# Patient Record
Sex: Female | Born: 1965 | Race: White | Hispanic: No | Smoking: Current every day smoker
Health system: Southern US, Community
[De-identification: ages and names within clinical notes are randomized; demographics above are authoritative.]

## PROBLEM LIST (undated history)

## (undated) DIAGNOSIS — D352 Benign neoplasm of pituitary gland: Secondary | ICD-10-CM

## (undated) DIAGNOSIS — F32A Depression, unspecified: Secondary | ICD-10-CM

## (undated) DIAGNOSIS — M5416 Radiculopathy, lumbar region: Secondary | ICD-10-CM

## (undated) DIAGNOSIS — IMO0002 Reserved for concepts with insufficient information to code with codable children: Secondary | ICD-10-CM

## (undated) DIAGNOSIS — F419 Anxiety disorder, unspecified: Secondary | ICD-10-CM

## (undated) DIAGNOSIS — R609 Edema, unspecified: Secondary | ICD-10-CM

## (undated) DIAGNOSIS — I10 Essential (primary) hypertension: Secondary | ICD-10-CM

## (undated) DIAGNOSIS — F329 Major depressive disorder, single episode, unspecified: Secondary | ICD-10-CM

## (undated) HISTORY — DX: Major depressive disorder, single episode, unspecified: F32.9

## (undated) HISTORY — DX: Anxiety disorder, unspecified: F41.9

## (undated) HISTORY — DX: Depression, unspecified: F32.A

---

## 2013-05-28 ENCOUNTER — Encounter (HOSPITAL_COMMUNITY): Payer: Self-pay | Admitting: Emergency Medicine

## 2013-05-28 ENCOUNTER — Emergency Department (HOSPITAL_COMMUNITY)
Admission: EM | Admit: 2013-05-28 | Discharge: 2013-05-29 | Disposition: A | Discharge status: Home / Self Care | Payer: Self-pay | Attending: Emergency Medicine | Admitting: Emergency Medicine

## 2013-05-28 ENCOUNTER — Emergency Department (INDEPENDENT_AMBULATORY_CARE_PROVIDER_SITE_OTHER)
Admission: EM | Admit: 2013-05-28 | Discharge: 2013-05-28 | Disposition: A | Discharge status: Nursing Home (Includes ICF) | Payer: Self-pay | Source: Home / Self Care

## 2013-05-28 DIAGNOSIS — R5383 Other fatigue: Secondary | ICD-10-CM

## 2013-05-28 DIAGNOSIS — F3289 Other specified depressive episodes: Secondary | ICD-10-CM

## 2013-05-28 DIAGNOSIS — I10 Essential (primary) hypertension: Secondary | ICD-10-CM | POA: Insufficient documentation

## 2013-05-28 DIAGNOSIS — Z79899 Other long term (current) drug therapy: Secondary | ICD-10-CM | POA: Insufficient documentation

## 2013-05-28 DIAGNOSIS — R45851 Suicidal ideations: Secondary | ICD-10-CM | POA: Insufficient documentation

## 2013-05-28 DIAGNOSIS — Z880 Allergy status to penicillin: Secondary | ICD-10-CM | POA: Insufficient documentation

## 2013-05-28 DIAGNOSIS — F329 Major depressive disorder, single episode, unspecified: Secondary | ICD-10-CM

## 2013-05-28 DIAGNOSIS — F411 Generalized anxiety disorder: Secondary | ICD-10-CM

## 2013-05-28 DIAGNOSIS — F1210 Cannabis abuse, uncomplicated: Secondary | ICD-10-CM | POA: Insufficient documentation

## 2013-05-28 DIAGNOSIS — F17200 Nicotine dependence, unspecified, uncomplicated: Secondary | ICD-10-CM | POA: Insufficient documentation

## 2013-05-28 DIAGNOSIS — F39 Unspecified mood [affective] disorder: Secondary | ICD-10-CM | POA: Diagnosis present

## 2013-05-28 DIAGNOSIS — Z8781 Personal history of (healed) traumatic fracture: Secondary | ICD-10-CM | POA: Insufficient documentation

## 2013-05-28 DIAGNOSIS — F419 Anxiety disorder, unspecified: Secondary | ICD-10-CM

## 2013-05-28 DIAGNOSIS — F410 Panic disorder [episodic paroxysmal anxiety] without agoraphobia: Secondary | ICD-10-CM

## 2013-05-28 DIAGNOSIS — F32A Depression, unspecified: Secondary | ICD-10-CM

## 2013-05-28 DIAGNOSIS — M549 Dorsalgia, unspecified: Secondary | ICD-10-CM | POA: Insufficient documentation

## 2013-05-28 DIAGNOSIS — R5381 Other malaise: Secondary | ICD-10-CM | POA: Insufficient documentation

## 2013-05-28 HISTORY — DX: Radiculopathy, lumbar region: M54.16

## 2013-05-28 HISTORY — DX: Edema, unspecified: R60.9

## 2013-05-28 HISTORY — DX: Essential (primary) hypertension: I10

## 2013-05-28 HISTORY — DX: Benign neoplasm of pituitary gland: D35.2

## 2013-05-28 HISTORY — DX: Reserved for concepts with insufficient information to code with codable children: IMO0002

## 2013-05-28 LAB — RAPID URINE DRUG SCREEN, HOSP PERFORMED
Amphetamines: NOT DETECTED
Barbiturates: NOT DETECTED
Benzodiazepines: NOT DETECTED
COCAINE: NOT DETECTED
OPIATES: NOT DETECTED
Tetrahydrocannabinol: POSITIVE — AB

## 2013-05-28 LAB — CBC
HCT: 45 % (ref 36.0–46.0)
HEMOGLOBIN: 16 g/dL — AB (ref 12.0–15.0)
MCH: 30.3 pg (ref 26.0–34.0)
MCHC: 35.6 g/dL (ref 30.0–36.0)
MCV: 85.2 fL (ref 78.0–100.0)
Platelets: 286 10*3/uL (ref 150–400)
RBC: 5.28 MIL/uL — ABNORMAL HIGH (ref 3.87–5.11)
RDW: 13.1 % (ref 11.5–15.5)
WBC: 10.3 10*3/uL (ref 4.0–10.5)

## 2013-05-28 LAB — COMPREHENSIVE METABOLIC PANEL
ALBUMIN: 4 g/dL (ref 3.5–5.2)
ALT: 13 U/L (ref 0–35)
AST: 15 U/L (ref 0–37)
Alkaline Phosphatase: 82 U/L (ref 39–117)
BUN: 15 mg/dL (ref 6–23)
CALCIUM: 9.9 mg/dL (ref 8.4–10.5)
CO2: 25 mEq/L (ref 19–32)
Chloride: 101 mEq/L (ref 96–112)
Creatinine, Ser: 0.73 mg/dL (ref 0.50–1.10)
GFR calc Af Amer: >90 mL/min (ref >90)
GFR calc non Af Amer: >90 mL/min (ref >90)
GLUCOSE: 100 mg/dL — AB (ref 70–99)
Potassium: 4 mEq/L (ref 3.7–5.3)
Sodium: 140 mEq/L (ref 137–147)
Total Bilirubin: 0.6 mg/dL (ref 0.3–1.2)
Total Protein: 7.3 g/dL (ref 6.0–8.3)

## 2013-05-28 LAB — ETHANOL

## 2013-05-28 LAB — TSH: TSH: 1.65 u[IU]/mL (ref 0.350–4.500)

## 2013-05-28 LAB — ACETAMINOPHEN LEVEL: Acetaminophen (Tylenol), Serum: <15 ug/mL (ref 10–30)

## 2013-05-28 LAB — SALICYLATE LEVEL: Salicylate Lvl: <2 mg/dL — ABNORMAL LOW (ref 2.8–20.0)

## 2013-05-28 LAB — PREGNANCY, URINE: Preg Test, Ur: NEGATIVE

## 2013-05-28 MED ORDER — HYDROCHLOROTHIAZIDE 50 MG PO TABS
50.0000 mg | ORAL_TABLET | Freq: Every day | ORAL | Status: DC
  Administered 2013-05-28 – 2013-05-29 (×2): 50 mg via ORAL
  Filled 2013-05-28 (×3): qty 1

## 2013-05-28 MED ORDER — LORAZEPAM 2 MG/ML IJ SOLN
1.0000 mg | Freq: Once | INTRAMUSCULAR | Status: AC
Start: 2013-05-28 — End: 2013-05-28
  Administered 2013-05-28: 1 mg via INTRAMUSCULAR

## 2013-05-28 MED ORDER — ONDANSETRON HCL 4 MG PO TABS
4.0000 mg | ORAL_TABLET | Freq: Three times a day (TID) | ORAL | Status: DC | PRN
  Administered 2013-05-29: 4 mg via ORAL
  Filled 2013-05-28: qty 1

## 2013-05-28 MED ORDER — ACETAMINOPHEN 325 MG PO TABS: 650.0000 mg | ORAL_TABLET | ORAL | Status: DC | PRN

## 2013-05-28 MED ORDER — PAROXETINE HCL 30 MG PO TABS
30.0000 mg | ORAL_TABLET | Freq: Every day | ORAL | Status: DC
  Administered 2013-05-28 – 2013-05-29 (×2): 30 mg via ORAL
  Filled 2013-05-28 (×3): qty 1

## 2013-05-28 MED ORDER — LORAZEPAM 2 MG/ML IJ SOLN
INTRAMUSCULAR | Status: AC
  Filled 2013-05-28: qty 1

## 2013-05-28 MED ORDER — IBUPROFEN 400 MG PO TABS: 600.0000 mg | ORAL_TABLET | Freq: Three times a day (TID) | ORAL | Status: DC | PRN

## 2013-05-28 MED ORDER — NICOTINE 21 MG/24HR TD PT24
21.0000 mg | MEDICATED_PATCH | Freq: Every day | TRANSDERMAL | Status: DC
  Administered 2013-05-28: 21 mg via TRANSDERMAL
  Filled 2013-05-28: qty 1

## 2013-05-28 MED ORDER — ALUM & MAG HYDROXIDE-SIMETH 200-200-20 MG/5ML PO SUSP: 30.0000 mL | ORAL | Status: DC | PRN

## 2013-05-28 MED ORDER — LORAZEPAM 1 MG PO TABS
1.0000 mg | ORAL_TABLET | Freq: Three times a day (TID) | ORAL | Status: DC | PRN
  Administered 2013-05-28 – 2013-05-29 (×2): 1 mg via ORAL
  Filled 2013-05-28 (×2): qty 1

## 2013-05-28 NOTE — ED Provider Notes (Addendum)
CSN: [id number]     Arrival date & time 05/28/13  66 History   First MD Initiated Contact with Patient 05/28/13 1800     Chief Complaint  Patient presents with  . Medical Clearance  . Anxiety     (Consider location/radiation/quality/duration/timing/severity/associated sxs/prior Treatment) Patient is a 48 y.o. female presenting with anxiety. The history is provided by the patient.  Anxiety This is a chronic problem. Episode onset: years. The problem occurs constantly. The problem has been gradually worsening. Pertinent negatives include no abdominal pain, no headaches and no shortness of breath. Associated symptoms comments: Insomnia, panic attacks, feelings of dread.  Constant worry, suicidal thoughts but no plan.. Exacerbated by: multiple recent stressors in her life. Nothing relieves the symptoms. Treatments tried: paxil and alprazolam. The treatment provided mild relief.    Past Medical History  Diagnosis Date  . Hypertension   . Lumbar radiculopathy   . Vertebral fracture   . Pituitary adenoma   . Edema    History reviewed. No pertinent past surgical history. History reviewed. No pertinent family history. History  Substance Use Topics  . Smoking status: Current Every Day Smoker  . Smokeless tobacco: Not on file  . Alcohol Use: Yes   OB History   Grav Para Term Preterm Abortions TAB SAB Ect Mult Living                 Review of Systems  Constitutional: Positive for fatigue and unexpected weight change.  Respiratory: Negative for cough and shortness of breath.   Gastrointestinal: Negative for nausea, vomiting and abdominal pain.  Musculoskeletal: Positive for back pain.  Neurological: Negative for headaches.  All other systems reviewed and are negative.     Allergies  Penicillins and Sulfur  Home Medications   Prior to Admission medications   Medication Sig Start Date End Date Taking? Authorizing Provider  ALPRAZolam (XANAX) 0.25 MG tablet Take 0.25 mg by  mouth 2 (two) times daily.    Historical Provider, MD  hydrochlorothiazide (HYDRODIURIL) 50 MG tablet Take 50 mg by mouth daily.    Historical Provider, MD  PARoxetine (PAXIL) 30 MG tablet Take 30 mg by mouth daily.    Historical Provider, MD   BP 125/94  Pulse 90  Temp(Src) 98.3 F (36.8 C) (Oral)  Resp 18  Ht 5\' 5"  (1.651 m)  Wt 189 lb 8 oz (85.957 kg)  BMI 31.53 kg/m2  SpO2 99% Physical Exam  Nursing note and vitals reviewed. Constitutional: She is oriented to person, place, and time. She appears well-developed and well-nourished. No distress.  HENT:  Head: Normocephalic and atraumatic.  Eyes: EOM are normal. Pupils are equal, round, and reactive to light.  Cardiovascular: Normal rate, regular rhythm, normal heart sounds and intact distal pulses.  Exam reveals no friction rub.   No murmur heard. Pulmonary/Chest: Effort normal and breath sounds normal. She has no wheezes. She has no rales.  Abdominal: Soft. Bowel sounds are normal. She exhibits no distension. There is no tenderness. There is no rebound and no guarding.  Musculoskeletal: Normal range of motion. She exhibits no tenderness.  No edema  Neurological: She is alert and oriented to person, place, and time. She has normal strength. No cranial nerve deficit or sensory deficit.  Reflex Scores:      Patellar reflexes are 2+ on the right side and 2+ on the left side. Skin: Skin is warm and dry. No rash noted.  Psychiatric: Her speech is normal and behavior is normal. Her mood  appears anxious. She expresses suicidal ideation. She expresses no suicidal plans.  tearful    ED Course  Procedures (including critical care time) Labs Review Labs Reviewed  CBC - Abnormal; Notable for the following:    RBC 5.28 (*)    Hemoglobin 16.0 (*)    All other components within normal limits  COMPREHENSIVE METABOLIC PANEL - Abnormal; Notable for the following:    Glucose, Bld 100 (*)    All other components within normal limits   SALICYLATE LEVEL - Abnormal; Notable for the following:    Salicylate Lvl <2.9 (*)    All other components within normal limits  URINE RAPID DRUG SCREEN (HOSP PERFORMED) - Abnormal; Notable for the following:    Tetrahydrocannabinol POSITIVE (*)    All other components within normal limits  ACETAMINOPHEN LEVEL  ETHANOL  PREGNANCY, URINE  TSH    Imaging Review No results found.   EKG Interpretation None      MDM   Final diagnoses:  Anxiety  Depression    Patient here with worsening depression and anxiety despite taking Paxil mostly due to situational stressors reports suicidal thoughts but no plan and no prior attempts. Patient is calm and cooperative at this time but appears anxious. Labs are within normal limits without acute findings but we'll also add on thyroid studies as patient has history of a pituitary adenoma and no recent evaluation with recent weight gain. Otherwise she is medically clear for psychiatry to evaluate.    Blanchie Dessert, MD 05/28/13 2150  Blanchie Dessert, MD 05/28/13 2150

## 2013-05-28 NOTE — ED Notes (Signed)
Pt sent [facility] from [facility] for further eval. Pt reports having high anxiety, stress levels, depression. Reports thoughts of suicide but no plan or attempts made. Calm and cooperative at triage.

## 2013-05-28 NOTE — ED Notes (Signed)
Patient has multiple complaints.  Patient last seen at [facility], not pleased with this clinic.  Patient has an appt for getting orange card 4/28.  Patient has been out of medicines for 1 day to 2 weeks ago.  Patient lives with mother and is her caregiver, mother suffers from traumatic brain injury.  Multiple psycho-social issues.  Patient has a friend with her today, appears supportive and able to calm patient.  Patient reports bad anxiety, panic attacks and "cant go on this way" statements.  When asked what she was going to do , patient replied "calling crisis hotline" patient states feeling suicidal, and when asked to explain, patient starts talking about stressors in life, but does not report any plan to hurt herself or anyone else.  Patient is tearful, sobbing at times.  Reports she cannot "function" with the way she is feeling.
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
## 2013-05-28 NOTE — ED Provider Notes (Signed)
CSN: [id number]     Arrival date & time 05/28/13  1434 History   First MD Initiated Contact with Patient 05/28/13 1542     Chief Complaint  Patient presents with  . Anxiety  . Depression   (Consider location/radiation/quality/duration/timing/severity/associated sxs/prior Treatment) HPI Comments: 48 y o F with severe stress reaction, anxiety disorder, depression and stresses that she is at the end of her rope. Hx  For 2 yrs. Has no money and has been passed among various health and mental health care providers. Mother very ill and demands constant attention.   Patient is a 48 y.o. female presenting with anxiety.  Anxiety Associated symptoms include shortness of breath.    Past Medical History  Diagnosis Date  . Hypertension   . Lumbar radiculopathy   . Vertebral fracture   . Pituitary adenoma   . Edema    No past surgical history on file. No family history on file. History  Substance Use Topics  . Smoking status: Current Every Day Smoker  . Smokeless tobacco: Not on file  . Alcohol Use: Yes   OB History   Grav Para Term Preterm Abortions TAB SAB Ect Mult Living                 Review of Systems  Constitutional: Positive for activity change and fatigue. Negative for fever.  HENT: Negative.   Respiratory: Positive for shortness of breath.        Associated with anxiety episodes, hyperventilation  Cardiovascular: Negative.   Genitourinary: Positive for menstrual problem.  Musculoskeletal: Positive for arthralgias.  Neurological: Positive for dizziness. Negative for syncope.  Psychiatric/Behavioral: Positive for suicidal ideas, behavioral problems, sleep disturbance, dysphoric mood, decreased concentration and agitation. The patient is nervous/anxious.     Allergies  Penicillins and Sulfur  Home Medications   Prior to Admission medications   Medication Sig Start Date End Date Taking? Authorizing Provider  ALPRAZolam (XANAX) 0.25 MG tablet Take 0.25 mg by mouth 2  (two) times daily.   Yes Historical Provider, MD  hydrochlorothiazide (HYDRODIURIL) 50 MG tablet Take 50 mg by mouth daily.   Yes Historical Provider, MD  PARoxetine (PAXIL) 30 MG tablet Take 30 mg by mouth daily.   Yes Historical Provider, MD   BP 151/101  Pulse 90  Temp(Src) 98.9 F (37.2 C) (Oral)  Resp 16  SpO2 98% Physical Exam  Nursing note and vitals reviewed. Constitutional: She is oriented to person, place, and time. She appears well-developed and well-nourished. She appears distressed.  Eyes: EOM are normal.  Neck: Normal range of motion. Neck supple.  Cardiovascular: Normal rate.   Pulmonary/Chest: Effort normal. No respiratory distress.  Neurological: She is alert and oriented to person, place, and time. She exhibits normal muscle tone.  Skin: Skin is warm and dry.  Psychiatric: Her speech is normal. Her mood appears anxious. She is not withdrawn. Thought content is not delusional. Cognition and memory are normal. She exhibits a depressed mood. She expresses no homicidal plans.  Expresses suicidal ideations that come and go,. She is attentive.    ED Course  Procedures (including critical care time) Labs Review Labs Reviewed - No data to display  No results found for this or any previous visit. Imaging Review No results found.   MDM   1. Depression   2. Anxiety disorder   3. Panic anxiety syndrome     Transfer to [place] for evaluation of psychiatric disorder associated  multiple physical complaints.  Discussed having episodes of  suicidal ideation.     Janne Napoleon, NP 05/28/13 [id number]

## 2013-05-28 NOTE — ED Notes (Signed)
Pt getting into scrubs, have notified charge rn and [facility] about needing a sitter, called security to wand the patient
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
## 2013-05-29 ENCOUNTER — Encounter (HOSPITAL_COMMUNITY): Payer: Self-pay | Admitting: Registered Nurse

## 2013-05-29 DIAGNOSIS — F39 Unspecified mood [affective] disorder: Secondary | ICD-10-CM | POA: Diagnosis present

## 2013-05-29 DIAGNOSIS — R45851 Suicidal ideations: Secondary | ICD-10-CM

## 2013-05-29 DIAGNOSIS — F29 Unspecified psychosis not due to a substance or known physiological condition: Secondary | ICD-10-CM

## 2013-05-29 MED ORDER — ALPRAZOLAM 0.25 MG PO TABS
0.2500 mg | ORAL_TABLET | Freq: Two times a day (BID) | ORAL | Status: DC
  Administered 2013-05-29: 0.25 mg via ORAL
  Filled 2013-05-29: qty 1

## 2013-05-29 MED ORDER — LORAZEPAM 1 MG PO TABS: 0.5000 mg | ORAL_TABLET | Freq: Four times a day (QID) | ORAL | Status: DC | PRN

## 2013-05-29 MED ORDER — PAROXETINE HCL 30 MG PO TABS: 30.0000 mg | ORAL_TABLET | Freq: Every day | ORAL | Status: DC

## 2013-05-29 NOTE — Consult Note (Signed)
  Chart information reviewed and consulted with Dr. Lovena Le After review of patient chart.  Recommend patient for 23 hour observation and set up with outpatient resources.  Patient has been accepted to [facility] observation unit.  Start home medications and transfer patient to [facility] observation.   Zia Kanner B. Zymeir Salminen FNP-BC  11:50 AM Addendum:  Spoke with Dr. Stark Jock and was informed that patient does not want to do observation.  Stated that patient denied suicidal/homicidal ideation, psychosis, and was able to contract for safety.  States that he will be discharging patient home with resources.    Kai Railsback B. Klarissa Mcilvain FNP-BC

## 2013-05-29 NOTE — Discharge Instructions (Signed)
Ativan as prescribed.  Followup with your counselor in 2 weeks as arranged, and return to the emergency department if you develop worsening symptoms or other new or bothersome symptoms.   Depression, Adult Depression refers to feeling sad, low, down in the dumps, blue, gloomy, or empty. In general, there are two kinds of depression: 1. Depression that we all experience from time to time because of upsetting life experiences, including the loss of a job or the ending of a relationship (normal sadness or normal grief). This kind of depression is considered normal, is short lived, and resolves within a few days to 2 weeks. (Depression experienced after the loss of a loved one is called bereavement. Bereavement often lasts longer than 2 weeks but normally gets better with time.) 2. Clinical depression, which lasts longer than normal sadness or normal grief or interferes with your ability to function at home, at work, and in school. It also interferes with your personal relationships. It affects almost every aspect of your life. Clinical depression is an illness. Symptoms of depression also can be caused by conditions other than normal sadness and grief or clinical depression. Examples of these conditions are listed as follows:  Physical illness Some physical illnesses, including underactive thyroid gland (hypothyroidism), severe anemia, specific types of cancer, diabetes, uncontrolled seizures, heart and lung problems, strokes, and chronic pain are commonly associated with symptoms of depression.  Side effects of some prescription medicine In some people, certain types of prescription medicine can cause symptoms of depression.  Substance abuse Abuse of alcohol and illicit drugs can cause symptoms of depression. SYMPTOMS Symptoms of normal sadness and normal grief include the following:  Feeling sad or crying for short periods of time.  Not caring about anything (apathy).  Difficulty sleeping or  sleeping too much.  No longer able to enjoy the things you used to enjoy.  Desire to be by oneself all the time (social isolation).  Lack of energy or motivation.  Difficulty concentrating or remembering.  Change in appetite or weight.  Restlessness or agitation. Symptoms of clinical depression include the same symptoms of normal sadness or normal grief and also the following symptoms:  Feeling sad or crying all the time.  Feelings of guilt or worthlessness.  Feelings of hopelessness or helplessness.  Thoughts of suicide or the desire to harm yourself (suicidal ideation).  Loss of touch with reality (psychotic symptoms). Seeing or hearing things that are not real (hallucinations) or having false beliefs about your life or the people around you (delusions and paranoia). DIAGNOSIS  The diagnosis of clinical depression usually is based on the severity and duration of the symptoms. Your caregiver also will ask you questions about your medical history and substance use to find out if physical illness, use of prescription medicine, or substance abuse is causing your depression. Your caregiver also may order blood tests. TREATMENT  Typically, normal sadness and normal grief do not require treatment. However, sometimes antidepressant medicine is prescribed for bereavement to ease the depressive symptoms until they resolve. The treatment for clinical depression depends on the severity of your symptoms but typically includes antidepressant medicine, counseling with a mental health professional, or a combination of both. Your caregiver will help to determine what treatment is best for you. Depression caused by physical illness usually goes away with appropriate medical treatment of the illness. If prescription medicine is causing depression, talk with your caregiver about stopping the medicine, decreasing the dose, or substituting another medicine. Depression caused by abuse of  alcohol or illicit  drugs abuse goes away with abstinence from these substances. Some adults need professional help in order to stop drinking or using drugs. SEEK IMMEDIATE CARE IF:  You have thoughts about hurting yourself or others.  You lose touch with reality (have psychotic symptoms).  You are taking medicine for depression and have a serious side effect. FOR MORE INFORMATION National Alliance on Mental Illness: www.nami.[employer] of Mental Health: https://carter.com/ Document Released: 01/29/2000 Document Revised: 08/02/2011 Document Reviewed: 05/02/2011 [facility] Patient Information 2014 [place].  Panic Attacks Panic attacks are sudden, short-livedsurges of severe anxiety, fear, or discomfort. They may occur for no reason when you are relaxed, when you are anxious, or when you are sleeping. Panic attacks may occur for a number of reasons:   Healthy people occasionally have panic attacks in extreme, life-threatening situations, such as war or natural disasters. Normal anxiety is a protective mechanism of the body that helps [place] react to danger (fight or flight response).  Panic attacks are often seen with anxiety disorders, such as panic disorder, social anxiety disorder, generalized anxiety disorder, and phobias. Anxiety disorders cause excessive or uncontrollable anxiety. They may interfere with your relationships or other life activities.  Panic attacks are sometimes seen with other mental illnesses such as depression and posttraumatic stress disorder.  Certain medical conditions, prescription medicines, and drugs of abuse can cause panic attacks. SYMPTOMS  Panic attacks start suddenly, peak within 20 minutes, and are accompanied by four or more of the following symptoms:  Pounding heart or fast heart rate (palpitations).  Sweating.  Trembling or shaking.  Shortness of breath or feeling smothered.  Feeling choked.  Chest pain or discomfort.  Nausea or strange feeling in  your stomach.  Dizziness, lightheadedness, or feeling like you will faint.  Chills or hot flushes.  Numbness or tingling in your lips or hands and feet.  Feeling that things are not real or feeling that you are not yourself.  Fear of losing control or going crazy.  Fear of dying. Some of these symptoms can mimic serious medical conditions. For example, you may think you are having a heart attack. Although panic attacks can be very scary, they are not life threatening. DIAGNOSIS  Panic attacks are diagnosed through an assessment by your health care provider. Your health care provider will ask questions about your symptoms, such as where and when they occurred. Your health care provider will also ask about your medical history and use of alcohol and drugs, including prescription medicines. Your health care provider may order blood tests or other studies to rule out a serious medical condition. Your health care provider may refer you to a mental health professional for further evaluation. TREATMENT   Most healthy people who have one or two panic attacks in an extreme, life-threatening situation will not require treatment.  The treatment for panic attacks associated with anxiety disorders or other mental illness typically involves counseling with a mental health professional, medicine, or a combination of both. Your health care provider will help determine what treatment is best for you.  Panic attacks due to physical illness usually goes away with treatment of the illness. If prescription medicine is causing panic attacks, talk with your health care provider about stopping the medicine, decreasing the dose, or substituting another medicine.  Panic attacks due to alcohol or drug abuse goes away with abstinence. Some adults need professional help in order to stop drinking or using drugs. HOME CARE INSTRUCTIONS   Take all  your medicines as prescribed.   Check with your health care provider  before starting new prescription or over-the-counter medicines.  Keep all follow up appointments with your health care provider. SEEK MEDICAL CARE IF:  You are not able to take your medicines as prescribed.  Your symptoms do not improve or get worse. SEEK IMMEDIATE MEDICAL CARE IF:   You experience panic attack symptoms that are different than your usual symptoms.  You have serious thoughts about hurting yourself or others.  You are taking medicine for panic attacks and have a serious side effect. MAKE SURE YOU:  Understand these instructions.  Will watch your condition.  Will get help right away if you are not doing well or get worse. Document Released: 01/31/2005 Document Revised: 11/21/2012 Document Reviewed: 09/14/2012 [facility] Patient Information 2014 [place].     Emergency Department Resource Guide 1) Find a Doctor and Pay Out of Pocket Although you won't have to find out who is covered by your insurance plan, it is a good idea to ask around and get recommendations. You will then need to call the office and see if the doctor you have chosen will accept you as a new patient and what types of options they offer for patients who are self-pay. Some doctors offer discounts or will set up payment plans for their patients who do not have insurance, but you will need to ask so you aren't surprised when you get to your appointment.  2) Contact Your Local Health Department Not all health departments have doctors that can see patients for sick visits, but many do, so it is worth a call to see if yours does. If you don't know where your local health department is, you can check in your phone book. The CDC also has a tool to help you locate your state's health department, and many state websites also have listings of all of their local health departments.  3) Find a [facility] If your illness is not likely to be very severe or complicated, you may want to try a walk in  clinic. These are popping up all over the country in pharmacies, drugstores, and shopping centers. They're usually staffed by nurse practitioners or physician assistants that have been trained to treat common illnesses and complaints. They're usually fairly quick and inexpensive. However, if you have serious medical issues or chronic medical problems, these are probably not your best option.  No Primary Care Doctor: - Call Health Connect at  [phone] - they can help you locate a primary care doctor that  accepts your insurance, provides certain services, etc. - Physician Referral Service- [phone]  Chronic Pain Problems: Organization         Address  Phone   Notes  [facility]  [phone] Patients need to be referred by their primary care doctor.   Medication Assistance: Organization         Address  Phone   Notes  [facility] Medication [facility] [address] [place], [place] [address] [phone] --Must be a resident of [facility] -- Must have NO insurance coverage whatsoever (no Medicaid/ Medicare, etc.) -- The pt. MUST have a primary care doctor that directs their care regularly and follows them in the community   MedAssist  [phone]   [employer]  [phone]    Agencies that provide inexpensive medical care: Organization         Address  Phone   Notes  [facility]  [phone]   [facility]    [phone]   [facility] [place], [place] [address] [phone]   [place] [address] [place] [phone]   Planned Parenthood    [phone]   [facility]    [phone]   [place] and [address], [place] Phone:  [phone], Fax:  [phone] Hours of Operation:  9 am - 6 pm, M-F.  Also accepts Medicaid/Medicare and self-pay.  [facility]  for [address], [address], [place] Phone: [phone], Fax: [phone]. Hours of Operation:  8:30 am - 5:30 pm, M-F.  Also accepts Medicaid and self-pay.  [facility] [place] [address], [place] Phone: [phone]   [place], [place], [place] [phone], Ext. [phone] Mondays & Thursdays: 7-9 AM.  First 15 patients are seen on a first come, first serve basis.    [place] Providers:  Organization         Address  Phone   Notes  [facility] [address], [place] [phone] Also accepts self-pay patients.  [facility] [address], [place]  [phone]   [place], [address], [place] [phone]   [facility] [address] [place] [phone]   Lucianne Lei [address], [place]   [phone] Only accepts [place] Access [place] patients after they have their name applied to their card.   Self-Pay (no insurance) in [facility]:  Organization         Address  Phone   Notes  Sickle Cell Patients, [facility] Internal Medicine [place] [phone]   [facility] [place] [phone]   [facility] [place]  [place], [place], [place] [phone]   [facility]/Dr. Osei-Bonsu  [address] [place] or [address], [place] [phone] Phone number for both [place] and [place] locations is the same.  Urgent Medical and [facility] [address], [place] [phone]   [facility] [address] [place] or [address] [phone]   [facility] [address] [place] [phone], phone; [phone], fax Sees patients  1st and 3rd Saturday of every month.  Must not qualify for public or private insurance (i.e. Medicaid, Medicare, [place] Health Choice, Veterans' Benefits)  Household income should be no more than 200% of the poverty level The clinic cannot treat you if you are pregnant or think you are pregnant  Sexually transmitted diseases are not treated at the clinic.    Dental Care: Organization         Address  Phone  Notes  [facility] Department of [facility] [phone] Accepts children up to age 67 who are enrolled in [place] or [place]; pregnant women with a Medicaid card; and children who have applied for Medicaid or [place] Health Choice, but were declined, whose parents can pay a reduced fee at time of service.  [facility] Department of  [facility]  [address], [place] [phone] Accepts children up to age 73 who are enrolled in Medicaid or [place]; pregnant women with a Medicaid card; and children who have applied for Medicaid or  Health Choice, but were declined, whose parents can pay a reduced fee at time of service.  [place] Adult Dental Access PROGRAM  [place] [phone] Patients are seen by appointment only. Walk-ins are not accepted. [place] will see patients 12 years of age and older. Monday - Tuesday (8am-5pm) Most Wednesdays (8:30-5pm) $30 per visit, cash only  [facility] Adult Dental Access PROGRAM  [address], [facility] [phone] Patients are seen by appointment only. Walk-ins are not accepted. [place] will see patients 70 years of age and older. One Wednesday Evening (Monthly: Volunteer Based).  $30 per visit, cash only  [place]  [phone] for adults; Children under age 65, call Graduate Pediatric Dentistry at [phone]. Children aged 37-14, please call [phone] to request a  pediatric application.  Dental services are provided in all areas of dental care including fillings, crowns and bridges, complete and partial dentures, implants, gum treatment, root canals, and extractions. Preventive care is also provided. Treatment is provided to both adults and children. Patients are selected via a lottery and there is often a waiting list.   [facility] [address], [place]  [phone] [email or web address]   [facility] [address] [place], [place] [phone], Ext. [phone] Second and Fourth Thursday of each month, opens at 6:30 AM; Clinic ends at 9 AM.  Patients are seen on a first-come first-served basis, and a limited number are seen during each clinic.   [facility]  [address] Hillard Danker [place], [place] [phone]   Eligibility Requirements You must have lived in [place], [place], or [place] counties for at least the last three months.   You cannot be eligible for state or federal sponsored [employer], including [employer], [place], or [employer].   You generally cannot be eligible for healthcare insurance through your employer.    How to apply: Eligibility screenings are held every Tuesday and Wednesday afternoon from 1:00 pm until 4:00 pm. You do not need an appointment for the interview!  [facility] [address], [place], [place]   [place]  [place] Department  [place]  [phone]    Behavioral Health Resources in the Community: Intensive Outpatient Programs Organization         Address  Phone  Notes  [place]. [address], [place], [place] [phone]   [facility] Outpatient [address] [place], [place]   ADS: Alcohol & Drug Svcs [address], [place], [place]   [place] [address]  [place], [place] or [phone]   Substance Abuse Resources Organization         Address  Phone  Notes  Alcohol and Drug Services  [phone]   [place]  [phone]   [facility] [place]   Chinita Pester  [phone]   Residential & Outpatient Substance Abuse Program  [phone]   Psychological Services Organization         Address  Phone  Notes  [place]  [place]   [place] [address] [place] or [phone]    Mobile Crisis Teams Organization         Address  Phone  Notes  [facility], Mobile Crisis Care Unit  [phone]   [facility]  [address] [place], [place]   [place] [address], [place] [phone]    Self-Help/Support Groups Organization         Address  Phone             Notes  [place]. of [place] - variety of support groups  [place] Call for more information  Narcotics Anonymous (NA), [facility] [address], [employer] [place]  2 meetings at this location   Special educational needs teacher         Address  Phone  Notes  [facility] Residential Treatment [place],    [place]  [phone]   [facility]  [address] [place] [address], [place], [place]   [place], [place] [phone] Admissions: 8am-3pm M-F  Incentives Substance [place] [address],    [place], [place] [phone]   [facility] [address] [place], [place], [place]   [facility] [address],  [place], [place]   [facility] - Intensive Outpatient [address] Kristeen [address], [place], [place]   [facility] ([place].) [place].,    [place], [place] [phone] or [phone]   [facility] Services (RTS) [address] [place], [place] Accepts Medicaid  [facility] [place] [facility] [address]  [place] [phone] Substance Abuse/Addiction Treatment   [facility] Organization         Address  Phone  Notes  [facility]  [phone]   Domenic Schwab, PhD [address] Arlis Porta [place], [place]   [phone] or [phone]   [place], [place] [phone]   [facility] [address] [place], [place] [phone] Insurance/Medicaid/sponsorship through [facility] and Families [address], [address] [place]                                    [place], [place] [phone] [place] [address], [place] [phone]    Dr. Adele Schilder  [phone]   [facility] [place] [facility] Dept. 1) [address], [place] 2) [place] 3)  [address], [place] [phone]  [phone]   [place] [phone] or [phone] (After Hours)

## 2013-05-29 NOTE — ED Notes (Signed)
Spoke with Pt. On being transferred to [facility]. Pt. Has requested speaking with our EDP.  Pt. And Dr. Stark Jock spoke and pt. Is going to have another TTS.  Pt. Has stated, "I am not suicidal and I would like outpatient treatment."  Spoke with [facility] and they have suggested to redo the TTS and contract to safety.  Dr. Stark Jock is aware and agrees with plan of care.  Updated pt. With plan of care.

## 2013-05-29 NOTE — Consult Note (Signed)
Face to face evaluation and I agree with this note 

## 2013-05-29 NOTE — ED Provider Notes (Addendum)
[facility] and to make arrangements for bed placement.  Veryl Speak, MD 05/29/13 [phone]   Patient requested to see me regarding her desire to be discharged. She states to me that she is not a danger to herself or others and that she has not suicidal or homicidal. She states she has a lot going on in her life and does not feel as though she can be hospitalized. She is requesting something to help with her nerves. She informs me she has a followup appointment with a counselor in 2 weeks. At this point, she appears to have decision-making capacity and does not appear to be a danger to herself. She has not been involuntarily committed and I feel as though she is appropriate for discharge. I will prescribe a small quantity of Ativan and she is to followup with her counselor as scheduled and return to the ER if she develops additional problems.  Veryl Speak, MD 05/29/13 1141

## 2013-05-31 NOTE — ED Provider Notes (Signed)
Medical screening examination/treatment/procedure(s) were performed by resident physician or non-physician practitioner and as supervising physician I was immediately available for consultation/collaboration.   Drema Eddington DOUGLAS MD.   Yaroslav Gombos D Abigaile Rossie, MD 05/31/13 1215 

## 2013-06-11 ENCOUNTER — Ambulatory Visit: Payer: Self-pay

## 2013-06-21 ENCOUNTER — Ambulatory Visit: Payer: Self-pay | Attending: Internal Medicine

## 2013-06-22 ENCOUNTER — Ambulatory Visit: Payer: Self-pay | Attending: Family Medicine | Admitting: Family Medicine

## 2013-06-22 ENCOUNTER — Encounter: Payer: Self-pay | Admitting: Family Medicine

## 2013-06-22 VITALS — BP 121/84 | HR 105 | Temp 98.4°F | Resp 16 | Ht 65.0 in | Wt 193.0 lb

## 2013-06-22 DIAGNOSIS — R5381 Other malaise: Secondary | ICD-10-CM | POA: Insufficient documentation

## 2013-06-22 DIAGNOSIS — F329 Major depressive disorder, single episode, unspecified: Secondary | ICD-10-CM

## 2013-06-22 DIAGNOSIS — F32A Depression, unspecified: Secondary | ICD-10-CM

## 2013-06-22 DIAGNOSIS — Z79899 Other long term (current) drug therapy: Secondary | ICD-10-CM | POA: Insufficient documentation

## 2013-06-22 DIAGNOSIS — E559 Vitamin D deficiency, unspecified: Secondary | ICD-10-CM | POA: Insufficient documentation

## 2013-06-22 DIAGNOSIS — IMO0002 Reserved for concepts with insufficient information to code with codable children: Secondary | ICD-10-CM | POA: Insufficient documentation

## 2013-06-22 DIAGNOSIS — F3289 Other specified depressive episodes: Secondary | ICD-10-CM | POA: Insufficient documentation

## 2013-06-22 DIAGNOSIS — Z124 Encounter for screening for malignant neoplasm of cervix: Secondary | ICD-10-CM | POA: Insufficient documentation

## 2013-06-22 DIAGNOSIS — F1210 Cannabis abuse, uncomplicated: Secondary | ICD-10-CM | POA: Insufficient documentation

## 2013-06-22 DIAGNOSIS — I10 Essential (primary) hypertension: Secondary | ICD-10-CM | POA: Insufficient documentation

## 2013-06-22 DIAGNOSIS — F17200 Nicotine dependence, unspecified, uncomplicated: Secondary | ICD-10-CM | POA: Insufficient documentation

## 2013-06-22 DIAGNOSIS — R5383 Other fatigue: Secondary | ICD-10-CM

## 2013-06-22 DIAGNOSIS — Z975 Presence of (intrauterine) contraceptive device: Secondary | ICD-10-CM | POA: Insufficient documentation

## 2013-06-22 DIAGNOSIS — F411 Generalized anxiety disorder: Secondary | ICD-10-CM | POA: Insufficient documentation

## 2013-06-22 MED ORDER — QUETIAPINE FUMARATE 50 MG PO TABS: 25.0000 mg | ORAL_TABLET | Freq: Two times a day (BID) | ORAL | Status: DC

## 2013-06-22 MED ORDER — HYDROCHLOROTHIAZIDE 12.5 MG PO TABS
25.0000 mg | ORAL_TABLET | Freq: Every day | ORAL | Status: DC
Start: 2013-06-22 — End: 2013-10-15

## 2013-06-22 NOTE — Assessment & Plan Note (Signed)
A: one high BP in system BP Readings from Last 3 Encounters:  06/22/13 121/84  05/29/13 117/80  05/28/13 151/101  P: Decrease HCTZ to recommended max of 25 mg daily Add additional agent like ACE i if needed

## 2013-06-22 NOTE — Patient Instructions (Addendum)
Ms. Coffin,  Thank you for coming in today. It was a pleasure meeting you.  I have refilled HCTZ with a dose decrease to the recommended max of 25 mg daily. I have sent the refill to our pharmacy.   Regarding anxiety and depression: I am concerned for bipolar depression given your screen.  1. Please start seroquel 1/2 tab twice daily, start one 1/2 tab at night for 3 days, then twice daily.  2. Continue ativan as needed only, as we discussed ativan like all benzos are short term only.  3. I also recommend that you see a therapist. I recommend [facility] please call [phone] 4. After one week of seroquel taper down paxil to 15 mg daily.    Your Recent tsh, CBC and CMP were all normal. I have ordered vitamin D today.   Please f/u in 1-2 weeks with our counselor on site.   Please f/u with MD in 3-4 weeks to re-evaluate symptoms on new medication.    Dr. Adrian Blackwater

## 2013-06-22 NOTE — Assessment & Plan Note (Addendum)
A: Regarding anxiety and depression: I am concerned for bipolar depression given your screen.  P: 1. Please start seroquel 1/2 tab twice daily, start one 1/2 tab at night for 3 days, then twice daily.  2. Continue ativan as needed only, as we discussed ativan like all benzos are short term only.  3. I also recommend that you see a therapist. I recommend [facility] please call [phone] 4. After one week of seroquel taper down paxil to 15 mg daily.   Recommend UDS at f/u patient did endorse THC denied other drugs.

## 2013-06-22 NOTE — Assessment & Plan Note (Signed)
A: IUD in place x > 10 years per patient P: Due for removal No ring forceps in office Patient to return for removal once ring forceps in stock
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
## 2013-06-22 NOTE — Progress Notes (Signed)
Pt here to establish care for hypertension diagnosed 6 mnths ago. Pt is taking prescribed HCTZ 50 mg. Tolerating well Pt c/o lower back pain radiating down both legs x 1 yr intemit with leg weakness/buckling. States she had braced fall from weakness 3 weeks ago Hx Depression and Anxiety- Taking her mothers Lorazepam/Paxil  PQ4/anxiety screening given LMP- 1 mnth ago with IUD Last Pap- normal,3 years ago Pt set up for pap smear screening. Refused STD screening

## 2013-06-22 NOTE — Assessment & Plan Note (Signed)
Pap done today  

## 2013-06-24 LAB — VITAMIN D 25 HYDROXY (VIT D DEFICIENCY, FRACTURES): Vit D, 25-Hydroxy: 22 ng/mL — ABNORMAL LOW (ref 30–89)

## 2013-06-26 DIAGNOSIS — E559 Vitamin D deficiency, unspecified: Secondary | ICD-10-CM | POA: Insufficient documentation

## 2013-06-26 MED ORDER — VITAMIN D3 25 MCG (1000 UNIT) PO TABS: 1000.0000 [IU] | ORAL_TABLET | Freq: Every day | ORAL | Status: DC

## 2013-06-26 NOTE — Addendum Note (Signed)
Addended by: Boykin Nearing on: 06/26/2013 09:21 AM   Modules accepted: Orders

## 2013-06-26 NOTE — Assessment & Plan Note (Signed)
A: vitamin D insufficiency P: daily supplementation to prevent deficiency

## 2013-07-03 ENCOUNTER — Telehealth: Payer: Self-pay | Admitting: Emergency Medicine

## 2013-07-03 NOTE — Telephone Encounter (Signed)
Message copied by Ricci Barker on Wed Jul 03, 2013  2:17 PM ------      Message from: Boykin Nearing      Created: Wed Jun 26, 2013  9:30 AM       Please inform patient.      Vit D insufficiency, patient to purchase oral vit D for daily supplementation 1000 IU recommended. She can take gummy, chews, tablets, capsules. ------

## 2013-07-03 NOTE — Telephone Encounter (Signed)
Left message for pt to call when message received 

## 2013-07-05 ENCOUNTER — Telehealth: Payer: Self-pay | Admitting: Emergency Medicine

## 2013-07-05 NOTE — Telephone Encounter (Signed)
Pt family friend called requesting assistance for pt experiencing severe panic attack. Pt was seen by Dr. Adrian Blackwater on 06/22/13 to establish care for anxiety,htn and depressive disorder. Pt was given number to f/u in 2 weeks with Behavioral health for monitor Seroquel and Ativan. Pt given f/u appt with Mateo Flow for further assessment.

## 2013-07-17 ENCOUNTER — Encounter: Payer: Self-pay | Admitting: Internal Medicine

## 2013-07-17 ENCOUNTER — Ambulatory Visit: Payer: Self-pay | Attending: Internal Medicine | Admitting: Internal Medicine

## 2013-07-17 VITALS — BP 123/90 | HR 99 | Temp 98.7°F | Resp 16 | Ht 65.0 in | Wt 190.0 lb

## 2013-07-17 DIAGNOSIS — I10 Essential (primary) hypertension: Secondary | ICD-10-CM | POA: Insufficient documentation

## 2013-07-17 DIAGNOSIS — Z882 Allergy status to sulfonamides status: Secondary | ICD-10-CM | POA: Insufficient documentation

## 2013-07-17 DIAGNOSIS — M549 Dorsalgia, unspecified: Secondary | ICD-10-CM | POA: Insufficient documentation

## 2013-07-17 DIAGNOSIS — Z880 Allergy status to penicillin: Secondary | ICD-10-CM | POA: Insufficient documentation

## 2013-07-17 DIAGNOSIS — Z79899 Other long term (current) drug therapy: Secondary | ICD-10-CM | POA: Insufficient documentation

## 2013-07-17 DIAGNOSIS — Z888 Allergy status to other drugs, medicaments and biological substances status: Secondary | ICD-10-CM | POA: Insufficient documentation

## 2013-07-17 DIAGNOSIS — F17200 Nicotine dependence, unspecified, uncomplicated: Secondary | ICD-10-CM | POA: Insufficient documentation

## 2013-07-17 DIAGNOSIS — G8929 Other chronic pain: Secondary | ICD-10-CM | POA: Insufficient documentation

## 2013-07-17 DIAGNOSIS — F411 Generalized anxiety disorder: Secondary | ICD-10-CM | POA: Insufficient documentation

## 2013-07-17 DIAGNOSIS — F3289 Other specified depressive episodes: Secondary | ICD-10-CM | POA: Insufficient documentation

## 2013-07-17 DIAGNOSIS — F329 Major depressive disorder, single episode, unspecified: Secondary | ICD-10-CM | POA: Insufficient documentation

## 2013-07-17 MED ORDER — ALPRAZOLAM 0.25 MG PO TABS: 0.2500 mg | ORAL_TABLET | Freq: Two times a day (BID) | ORAL | Status: DC | PRN

## 2013-07-17 MED ORDER — MELOXICAM 15 MG PO TABS: 15.0000 mg | ORAL_TABLET | Freq: Every day | ORAL | Status: DC

## 2013-07-17 NOTE — Patient Instructions (Signed)
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
## 2013-07-23 ENCOUNTER — Other Ambulatory Visit: Payer: Self-pay | Admitting: Internal Medicine

## 2013-07-23 DIAGNOSIS — F329 Major depressive disorder, single episode, unspecified: Secondary | ICD-10-CM

## 2013-07-23 DIAGNOSIS — F32A Depression, unspecified: Secondary | ICD-10-CM

## 2013-07-23 DIAGNOSIS — M549 Dorsalgia, unspecified: Secondary | ICD-10-CM

## 2013-07-23 DIAGNOSIS — F411 Generalized anxiety disorder: Secondary | ICD-10-CM

## 2013-07-23 DIAGNOSIS — G8929 Other chronic pain: Secondary | ICD-10-CM

## 2013-07-23 NOTE — Telephone Encounter (Signed)
Patient wants to know if she can have a refill on Seroquel, Xanax, and Meloxicam until she see psych MD in July.

## 2013-07-23 NOTE — Telephone Encounter (Signed)
Pt. Has scheduled appt. with Psychiatric in July.....she has ran out of Xanax. But will need a refill on Seroquel and Meloxicam..Marland KitchenPlease call patient and advise as to what she can do until she sees a psychiatrist..

## 2013-07-24 MED ORDER — MELOXICAM 15 MG PO TABS: 15.0000 mg | ORAL_TABLET | Freq: Every day | ORAL | Status: DC

## 2013-07-24 MED ORDER — ALPRAZOLAM 0.25 MG PO TABS: 0.2500 mg | ORAL_TABLET | Freq: Two times a day (BID) | ORAL | Status: DC | PRN

## 2013-07-24 MED ORDER — QUETIAPINE FUMARATE 50 MG PO TABS: 25.0000 mg | ORAL_TABLET | Freq: Two times a day (BID) | ORAL | Status: DC

## 2013-08-05 ENCOUNTER — Telehealth: Payer: Self-pay | Admitting: Internal Medicine
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
## 2013-08-06 ENCOUNTER — Telehealth: Payer: Self-pay | Admitting: Emergency Medicine

## 2013-08-06 NOTE — Telephone Encounter (Signed)
Pt requesting medication refill of Xanax due to stress until sen by psychiatry 08/23/13

## 2013-08-11 NOTE — Telephone Encounter (Signed)
Please call patient and remind her that no more Xanax will be filled from this office per last visit note. Patient will need to see psychiatry for further management of anxiety and stress management. Please let her know that she may schedule appointment with Tywan if needed.

## 2013-08-12 ENCOUNTER — Telehealth: Payer: Self-pay | Admitting: Emergency Medicine

## 2013-08-12 NOTE — Telephone Encounter (Signed)
Left message for pt to call when message received 

## 2013-10-03 ENCOUNTER — Emergency Department (HOSPITAL_COMMUNITY)
Admission: EM | Admit: 2013-10-03 | Discharge: 2013-10-06 | Disposition: A | Discharge status: Other Acute Inpatient Hospital | Payer: Federal, State, Local not specified - Other | Attending: Emergency Medicine | Admitting: Emergency Medicine

## 2013-10-03 ENCOUNTER — Encounter (HOSPITAL_COMMUNITY): Payer: Self-pay | Admitting: Emergency Medicine

## 2013-10-03 DIAGNOSIS — I10 Essential (primary) hypertension: Secondary | ICD-10-CM | POA: Insufficient documentation

## 2013-10-03 DIAGNOSIS — F1210 Cannabis abuse, uncomplicated: Secondary | ICD-10-CM | POA: Insufficient documentation

## 2013-10-03 DIAGNOSIS — F1510 Other stimulant abuse, uncomplicated: Secondary | ICD-10-CM | POA: Insufficient documentation

## 2013-10-03 DIAGNOSIS — Z862 Personal history of diseases of the blood and blood-forming organs and certain disorders involving the immune mechanism: Secondary | ICD-10-CM | POA: Insufficient documentation

## 2013-10-03 DIAGNOSIS — F329 Major depressive disorder, single episode, unspecified: Secondary | ICD-10-CM | POA: Diagnosis present

## 2013-10-03 DIAGNOSIS — Z880 Allergy status to penicillin: Secondary | ICD-10-CM | POA: Insufficient documentation

## 2013-10-03 DIAGNOSIS — F39 Unspecified mood [affective] disorder: Secondary | ICD-10-CM | POA: Insufficient documentation

## 2013-10-03 DIAGNOSIS — F32A Depression, unspecified: Secondary | ICD-10-CM | POA: Diagnosis present

## 2013-10-03 DIAGNOSIS — Z8639 Personal history of other endocrine, nutritional and metabolic disease: Secondary | ICD-10-CM | POA: Insufficient documentation

## 2013-10-03 DIAGNOSIS — F411 Generalized anxiety disorder: Secondary | ICD-10-CM | POA: Insufficient documentation

## 2013-10-03 DIAGNOSIS — Z8781 Personal history of (healed) traumatic fracture: Secondary | ICD-10-CM | POA: Insufficient documentation

## 2013-10-03 DIAGNOSIS — F3289 Other specified depressive episodes: Secondary | ICD-10-CM | POA: Insufficient documentation

## 2013-10-03 DIAGNOSIS — F17200 Nicotine dependence, unspecified, uncomplicated: Secondary | ICD-10-CM | POA: Insufficient documentation

## 2013-10-03 DIAGNOSIS — R45851 Suicidal ideations: Secondary | ICD-10-CM

## 2013-10-03 DIAGNOSIS — F419 Anxiety disorder, unspecified: Secondary | ICD-10-CM | POA: Diagnosis present

## 2013-10-03 DIAGNOSIS — Z8739 Personal history of other diseases of the musculoskeletal system and connective tissue: Secondary | ICD-10-CM | POA: Insufficient documentation

## 2013-10-03 DIAGNOSIS — IMO0002 Reserved for concepts with insufficient information to code with codable children: Secondary | ICD-10-CM | POA: Insufficient documentation

## 2013-10-03 DIAGNOSIS — Z79899 Other long term (current) drug therapy: Secondary | ICD-10-CM | POA: Insufficient documentation

## 2013-10-03 LAB — CBC
HCT: 43.4 % (ref 36.0–46.0)
Hemoglobin: 15 g/dL (ref 12.0–15.0)
MCH: 29.8 pg (ref 26.0–34.0)
MCHC: 34.6 g/dL (ref 30.0–36.0)
MCV: 86.3 fL (ref 78.0–100.0)
Platelets: 323 10*3/uL (ref 150–400)
RBC: 5.03 MIL/uL (ref 3.87–5.11)
RDW: 12.5 % (ref 11.5–15.5)
WBC: 10 10*3/uL (ref 4.0–10.5)

## 2013-10-03 LAB — COMPREHENSIVE METABOLIC PANEL
ALBUMIN: 3.7 g/dL (ref 3.5–5.2)
ALT: 12 U/L (ref 0–35)
AST: 11 U/L (ref 0–37)
Alkaline Phosphatase: 65 U/L (ref 39–117)
Anion gap: 11 (ref 5–15)
BUN: 10 mg/dL (ref 6–23)
CO2: 25 mEq/L (ref 19–32)
Calcium: 10.5 mg/dL (ref 8.4–10.5)
Chloride: 102 mEq/L (ref 96–112)
Creatinine, Ser: 0.65 mg/dL (ref 0.50–1.10)
Glucose, Bld: 173 mg/dL — ABNORMAL HIGH (ref 70–99)
Potassium: 3.5 mEq/L — ABNORMAL LOW (ref 3.7–5.3)
Sodium: 138 mEq/L (ref 137–147)
Total Bilirubin: 0.5 mg/dL (ref 0.3–1.2)
Total Protein: 7 g/dL (ref 6.0–8.3)

## 2013-10-03 LAB — RAPID URINE DRUG SCREEN, HOSP PERFORMED
Amphetamines: POSITIVE — AB
BARBITURATES: NOT DETECTED
Benzodiazepines: NOT DETECTED
Cocaine: NOT DETECTED
Opiates: NOT DETECTED
Tetrahydrocannabinol: POSITIVE — AB

## 2013-10-03 LAB — SALICYLATE LEVEL: Salicylate Lvl: <2 mg/dL — ABNORMAL LOW (ref 2.8–20.0)

## 2013-10-03 LAB — ETHANOL

## 2013-10-03 LAB — ACETAMINOPHEN LEVEL: Acetaminophen (Tylenol), Serum: <15 ug/mL (ref 10–30)

## 2013-10-03 LAB — LITHIUM LEVEL

## 2013-10-03 MED ORDER — ACETAMINOPHEN 325 MG PO TABS
650.0000 mg | ORAL_TABLET | ORAL | Status: DC | PRN
Start: 2013-10-03 — End: 2013-10-06
  Administered 2013-10-03: 650 mg via ORAL
  Filled 2013-10-03: qty 2

## 2013-10-03 MED ORDER — LORAZEPAM 1 MG PO TABS
1.0000 mg | ORAL_TABLET | Freq: Three times a day (TID) | ORAL | Status: DC | PRN
  Administered 2013-10-03 – 2013-10-06 (×6): 1 mg via ORAL
  Filled 2013-10-03 (×6): qty 1

## 2013-10-03 MED ORDER — NICOTINE 21 MG/24HR TD PT24: 21.0000 mg | MEDICATED_PATCH | Freq: Every day | TRANSDERMAL | Status: DC

## 2013-10-03 MED ORDER — HYDROCHLOROTHIAZIDE 25 MG PO TABS
25.0000 mg | ORAL_TABLET | Freq: Every day | ORAL | Status: DC
  Administered 2013-10-04 – 2013-10-06 (×3): 25 mg via ORAL
  Filled 2013-10-03 (×3): qty 1

## 2013-10-03 MED ORDER — IBUPROFEN 200 MG PO TABS
600.0000 mg | ORAL_TABLET | Freq: Three times a day (TID) | ORAL | Status: DC | PRN
  Administered 2013-10-04 – 2013-10-05 (×2): 600 mg via ORAL
  Filled 2013-10-03 (×2): qty 3

## 2013-10-03 MED ORDER — ONDANSETRON HCL 4 MG PO TABS: 4.0000 mg | ORAL_TABLET | Freq: Three times a day (TID) | ORAL | Status: DC | PRN

## 2013-10-03 MED ORDER — ALUM & MAG HYDROXIDE-SIMETH 200-200-20 MG/5ML PO SUSP: 30.0000 mL | ORAL | Status: DC | PRN

## 2013-10-03 NOTE — BH Assessment (Signed)
Assessment Note  Stacy Barrett is an 48 y.o. female. Patient was brought into the ED by friend because of suicidal ideations with plan to cut throat and severe anxiety.  Patient reports her life is depending on being able to re-establish her previous life style,  if not she do not have anything to live for.  "I can't take care of myself, I may as well be dead".  Patient reports she took care of her mother as a result she loss her job then her home now homeless living with a friend.  She reports that her mother has a TBI and since patient became homeless will not allow the patient to live in her home. "I want to sue my parents for alienation". Patient reports having severe anxiety/panic attack daily.  Symptoms of panic attacks are overwhelming, SOB, hot/cold flashes, "I feel like someone kicked me in my stomach", and unable to think clear.   Patient reports recent marijuana use but tested positive for amphetamines. Patient denies any other drug use that would get a positive drug screen.    CSW ran patient with Heloise Purpura, NP who recommends patient meets criteria for inpatient treatment for safety and stabilization.     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 Hypertension   . Lumbar radiculopathy   . Vertebral fracture   . Pituitary adenoma   . Edema   . Depression   . Anxiety    Axis IV: economic problems, housing problems, occupational problems, other psychosocial or environmental problems, problems related to social environment, problems with access to health care services and problems with primary support group Axis V: 41-50 serious symptoms  Past Medical History:  Past Medical History  Diagnosis Date  . Hypertension   . Lumbar radiculopathy   . Vertebral fracture   . Pituitary adenoma   . Edema   . Depression   . Anxiety     History reviewed. No pertinent past surgical  history.  Family History: History reviewed. No pertinent family history.  Social History:  reports that she has been smoking.  She does not have any smokeless tobacco history on file. She reports that she drinks alcohol. She reports that she uses illicit drugs (Marijuana).  Additional Social History:     CIWA: CIWA-Ar BP: 125/63 mmHg Pulse Rate: 80 COWS:    Allergies:  Allergies  Allergen Reactions  . Other Other (See Comments)    MSG -Very intense headaches  . Penicillins Hives  . Prednisone Itching, Swelling and Rash    Swelling of lips and face  . Sulfa Antibiotics Hives and Itching    Home Medications:  (Not in a hospital admission)  OB/GYN Status:  No LMP recorded.  General Assessment Data Location of Assessment: [facility] ACT Assessment: Yes Is this a Tele or Face-to-Face Assessment?: Face-to-Face Is this an Initial Assessment or a Re-assessment for this encounter?: Initial Assessment Living Arrangements: Non-relatives/Friends Can pt return to current living arrangement?: Yes Admission Status: Voluntary Is patient capable of signing voluntary admission?: Yes Transfer from: Home Referral Source: Self/Family/Friend  Medical Screening Exam ([place]) Medical Exam completed: Yes  [place] Living Arrangements: Non-relatives/Friends Name of Psychiatrist: [facility] Name of Therapist: [facility]  Education Status Is patient currently in school?: No  Risk to self with the past 6 months Suicidal Ideation: Yes-Currently Present Suicidal  Intent: Yes-Currently Present Is patient at risk for suicide?: Yes Suicidal Plan?: Yes-Currently Present Specify Current Suicidal Plan: cut throat,  Access to Means: Yes Specify Access to Suicidal Means: sharpe object What has been your use of drugs/alcohol within the last 12 months?: THC Previous Attempts/Gestures: Yes How many times?: 0 Intentional Self Injurious  Behavior: None Family Suicide History: No Recent stressful life event(s): Conflict (Comment);Loss (Comment);Job Loss;Financial Problems;Other (Comment) (relational) Persecutory voices/beliefs?: No Depression: Yes Depression Symptoms: Despondent;Insomnia;Tearfulness;Isolating;Fatigue;Guilt;Loss of interest in usual pleasures;Feeling worthless/self pity;Feeling angry/irritable (hopelessness) Substance abuse history and/or treatment for substance abuse?: Yes  Risk to Others within the past 6 months Homicidal Ideation: No-Not Currently/Within Last 6 Months Thoughts of Harm to Others: No-Not Currently Present/Within Last 6 Months Current Homicidal Intent: No-Not Currently/Within Last 6 Months Current Homicidal Plan: No-Not Currently/Within Last 6 Months Access to Homicidal Means: No History of harm to others?: No Assessment of Violence: None Noted Does patient have access to weapons?: No Criminal Charges Pending?: No Does patient have a court date: No  Psychosis Hallucinations: None noted Delusions: None noted  Mental Status Report Appear/Hygiene: Disheveled Eye Contact: Poor Motor Activity: Freedom of movement Speech: Soft Level of Consciousness: Drowsy Mood: Depressed;Anxious Affect: Depressed;Irritable Anxiety Level: Minimal Thought Processes: Coherent Judgement: Impaired Orientation: Person;Place;Time;Situation Obsessive Compulsive Thoughts/Behaviors: None  Cognitive Functioning Concentration: Fair Memory: Recent Intact;Remote Intact IQ: Average Insight: Fair Impulse Control: Fair Appetite: Fair Sleep: Decreased Vegetative Symptoms: None  ADLScreening [facility] Assessment Services) Patient's cognitive ability adequate to safely complete daily activities?: Yes Patient able to express need for assistance with ADLs?: Yes Independently performs ADLs?: Yes (appropriate for developmental age)  Prior Inpatient Therapy Prior Inpatient Therapy: Yes Prior Therapy Dates:  2015 Prior Therapy Facilty/Provider(s): [facility] Reason for Treatment: anxiety, depression  Prior Outpatient Therapy Prior Outpatient Therapy: Yes Prior Therapy Dates: 2015 Prior Therapy Facilty/Provider(s): [facility] Reason for Treatment: depression, anxiety  ADL Screening (condition at time of admission) Patient's cognitive ability adequate to safely complete daily activities?: Yes Patient able to express need for assistance with ADLs?: Yes Independently performs ADLs?: Yes (appropriate for developmental age)         Values / Beliefs Cultural Requests During Hospitalization: None Spiritual Requests During Hospitalization: None        Additional Information 1:1 In Past 12 Months?: No CIRT Risk: No Elopement Risk: No Does patient have medical clearance?: Yes     Disposition:  Disposition Initial Assessment Completed for this Encounter: Yes Disposition of Patient: Inpatient treatment program Type of inpatient treatment program: Adult  On Site Evaluation by:   Reviewed with Physician:    Chesley Noon A 10/03/2013 9:56 PM

## 2013-10-03 NOTE — ED Notes (Signed)
Pt eval by Education officer, museum, Claudette Head.

## 2013-10-03 NOTE — ED Provider Notes (Signed)
CSN: [id number]     Arrival date & time 10/03/13  1641 History   First MD Initiated Contact with Patient 10/03/13 1821     Chief Complaint  Patient presents with  . Suicidal     (Consider location/radiation/quality/duration/timing/severity/associated sxs/prior Treatment) HPI Comments: Patient here complaining of worsening anxiety and depression x2 weeks. She currently has suicidal ideations with plan to cut her throat. Denies any command hallucinations. Saw a therapist a month ago and was placed on lithium which she says nothing helping. Denies homicidal ideations. No recent fever chills or neck pain. Has a lot of external stressors currently at this time. Symptoms have been worsening and nothing makes them better. Denies any prior history of suicide attempt.  The history is provided by the patient and a friend.    Past Medical History  Diagnosis Date  . Hypertension   . Lumbar radiculopathy   . Vertebral fracture   . Pituitary adenoma   . Edema   . Depression   . Anxiety    History reviewed. No pertinent past surgical history. History reviewed. No pertinent family history. History  Substance Use Topics  . Smoking status: Current Every Day Smoker  . Smokeless tobacco: Not on file  . Alcohol Use: Yes   OB History   Grav Para Term Preterm Abortions TAB SAB Ect Mult Living                 Review of Systems  All other systems reviewed and are negative.     Allergies  Other; Penicillins; Prednisone; and Sulfa antibiotics  Home Medications   Prior to Admission medications   Medication Sig Start Date End Date Taking? Authorizing Provider  hydrochlorothiazide (HYDRODIURIL) 12.5 MG tablet Take 2 tablets (25 mg total) by mouth daily. 06/22/13  Yes Josalyn C Funches, MD  lithium carbonate 300 MG capsule Take 300 mg by mouth at bedtime.   Yes Historical Provider, MD   BP 154/95  Pulse 106  Temp(Src) 98.1 F (36.7 C) (Oral)  Resp 20  SpO2 100% Physical Exam  Nursing note  and vitals reviewed. Constitutional: She is oriented to person, place, and time. She appears well-developed and well-nourished.  Non-toxic appearance. No distress.  HENT:  Head: Normocephalic and atraumatic.  Eyes: Conjunctivae, EOM and lids are normal. Pupils are equal, round, and reactive to light.  Neck: Normal range of motion. Neck supple. No tracheal deviation present. No mass present.  Cardiovascular: Normal rate, regular rhythm and normal heart sounds.  Exam reveals no gallop.   No murmur heard. Pulmonary/Chest: Effort normal and breath sounds normal. No stridor. No respiratory distress. She has no decreased breath sounds. She has no wheezes. She has no rhonchi. She has no rales.  Abdominal: Soft. Normal appearance and bowel sounds are normal. She exhibits no distension. There is no tenderness. There is no rebound and no CVA tenderness.  Musculoskeletal: Normal range of motion. She exhibits no edema and no tenderness.  Neurological: She is alert and oriented to person, place, and time. She has normal strength. No cranial nerve deficit or sensory deficit. GCS eye subscore is 4. GCS verbal subscore is 5. GCS motor subscore is 6.  Skin: Skin is warm and dry. No abrasion and no rash noted.  Psychiatric: Her speech is rapid and/or pressured. She is agitated. Thought content is not delusional. She exhibits a depressed mood. She expresses suicidal ideation. She expresses suicidal plans. She expresses no homicidal plans.    ED Course  Procedures (including critical  care time) Labs Review Labs Reviewed  URINE RAPID DRUG SCREEN (HOSP PERFORMED) - Abnormal; Notable for the following:    Amphetamines POSITIVE (*)    Tetrahydrocannabinol POSITIVE (*)    All other components within normal limits  CBC  ACETAMINOPHEN LEVEL  COMPREHENSIVE METABOLIC PANEL  ETHANOL  SALICYLATE LEVEL  LITHIUM LEVEL    Imaging Review No results found.   EKG Interpretation None      MDM   Final diagnoses:   None    Patient to be medically cleared and then seen by psychiatry for disposition    Leota Jacobsen, MD 10/03/13 [phone]
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
## 2013-10-03 NOTE — ED Notes (Signed)
Bed: WBH34 Expected date:  Expected time:  Means of arrival:  Comments: Triage 4 

## 2013-10-03 NOTE — ED Notes (Addendum)
Pt presents with complaint of anxiety and depression increasing in severity x 1-2 weeks.  Pt crying, tearful. Complaint of SI, no specific plan, denies HI or AV hallucinations.   Pt reports she has lost her home, has no vehicle, feeling hopeless, unemployed and reports her mother has Dementia.  Pt reports she feels  Lithium is not working.  Pt Cooperative, visiting with friend at present.

## 2013-10-04 ENCOUNTER — Encounter (HOSPITAL_COMMUNITY): Payer: Self-pay | Admitting: Psychiatry

## 2013-10-04 MED ORDER — SERTRALINE HCL 50 MG PO TABS: 50.0000 mg | ORAL_TABLET | Freq: Every day | ORAL | Status: DC

## 2013-10-04 MED ORDER — DULOXETINE HCL 20 MG PO CPEP
20.0000 mg | ORAL_CAPSULE | Freq: Every day | ORAL | Status: DC
  Administered 2013-10-04 – 2013-10-06 (×3): 20 mg via ORAL
  Filled 2013-10-04 (×3): qty 1

## 2013-10-04 NOTE — BH Assessment (Signed)
[place] Assessment Progress Note Accepted to [facility] waitlist by Dr. Dareen Piano pending an [place] bed per Roderic Palau.  They will let us know when they have a bed.

## 2013-10-04 NOTE — ED Notes (Signed)
Report received from [facility]. Pt. Alert and oriented in no distress denies SI, HI, AVH and pain. Will continue to monitor for safety. Pt. Instructed to come to me with problems or concerns. Q 15 minute checks continue. Pt. Sad and states she doesn't want to be here.

## 2013-10-04 NOTE — Consult Note (Signed)
[facility] Face-to-Face Psychiatry Consult   Reason for Consult:  Depression Referring Physician:  EDP  Stacy Barrett is an 48 y.o. female. Total Time spent with patient: 20 minutes  Assessment: AXIS I:  Mood Disorder NOS AXIS II:  Deferred AXIS III:   Past Medical History  Diagnosis Date  . Hypertension   . Lumbar radiculopathy   . Vertebral fracture   . Pituitary adenoma   . Edema   . Depression   . Anxiety    AXIS IV:  other psychosocial or environmental problems, problems related to social environment and problems with primary support group AXIS V:  21-30 behavior considerably influenced by delusions or hallucinations OR serious impairment in judgment, communication OR inability to function in almost all areas  Plan:  Recommend psychiatric Inpatient admission when medically cleared.Dr. Dwyane Dee assessed the patient and concurs with the plan.  Subjective:   Stacy Barrett is a 48 y.o. female patient admitted with depression and suicide plan to overdose.  HPI:  Patient endorses depression and suicidal ideations with a plan to overdose.  She went to [facility] and they started her on Lithium for mood stability.  Stacy Barrett states she has been taking it but a low dose and subtherapeutic dose.  She has been "crying all the time; just lost."  Her mother had Alzheimers and had tried to take care of her, recently died.  Her father is estranged and has his own family, she is an only child with no spouse of children.  She feels alone and hopelessness.  Denies homicidal ideations, hallucinations, and drug/alcohol abuse. HPI Elements:   Location:  generalized. Quality:  acute. Severity:  severe. Timing:  constant. Duration:  two weeks. Context:  stressors.  Past Psychiatric History: Past Medical History  Diagnosis Date  . Hypertension   . Lumbar radiculopathy   . Vertebral fracture   . Pituitary adenoma   . Edema   . Depression   . Anxiety     reports that she has been  smoking.  She does not have any smokeless tobacco history on file. She reports that she drinks alcohol. She reports that she uses illicit drugs (Marijuana). History reviewed. No pertinent family history. Family History Substance Abuse: Yes, Describe: Family Supports: No Living Arrangements: Non-relatives/Friends Can pt return to current living arrangement?: Yes   Allergies:   Allergies  Allergen Reactions  . Other Other (See Comments)    MSG -Very intense headaches  . Penicillins Hives  . Prednisone Itching, Swelling and Rash    Swelling of lips and face  . Sulfa Antibiotics Hives and Itching    ACT Assessment Complete:  Yes:    Educational Status    Risk to Self: Risk to self with the past 6 months Suicidal Ideation: Yes-Currently Present Suicidal Intent: Yes-Currently Present Is patient at risk for suicide?: Yes Suicidal Plan?: Yes-Currently Present Specify Current Suicidal Plan: cut throat,  Access to Means: Yes Specify Access to Suicidal Means: sharpe object What has been your use of drugs/alcohol within the last 12 months?: THC Previous Attempts/Gestures: Yes How many times?: 0 Intentional Self Injurious Behavior: None Family Suicide History: No Recent stressful life event(s): Conflict (Comment);Loss (Comment);Job Loss;Financial Problems;Other (Comment) (relational) Persecutory voices/beliefs?: No Depression: Yes Depression Symptoms: Despondent;Insomnia;Tearfulness;Isolating;Fatigue;Guilt;Loss of interest in usual pleasures;Feeling worthless/self pity;Feeling angry/irritable (hopelessness) Substance abuse history and/or treatment for substance abuse?: Yes  Risk to Others: Risk to Others within the past 6 months Homicidal Ideation: No-Not Currently/Within Last 6 Months Thoughts of Harm to  Others: No-Not Currently Present/Within Last 6 Months Current Homicidal Intent: No-Not Currently/Within Last 6 Months Current Homicidal Plan: No-Not Currently/Within Last 6  Months Access to Homicidal Means: No History of harm to others?: No Assessment of Violence: None Noted Does patient have access to weapons?: No Criminal Charges Pending?: No Does patient have a court date: No  Abuse:    Prior Inpatient Therapy: Prior Inpatient Therapy Prior Inpatient Therapy: Yes Prior Therapy Dates: 2015 Prior Therapy Facilty/Provider(s): [facility] Reason for Treatment: anxiety, depression  Prior Outpatient Therapy: Prior Outpatient Therapy Prior Outpatient Therapy: Yes Prior Therapy Dates: 2015 Prior Therapy Facilty/Provider(s): [facility] Reason for Treatment: depression, anxiety  Additional Information: Additional Information 1:1 In Past 12 Months?: No CIRT Risk: No Elopement Risk: No Does patient have medical clearance?: Yes                  Objective: Blood pressure 137/97, pulse 86, temperature 98.5 F (36.9 C), temperature source Oral, resp. rate 12, SpO2 97.00%.There is no weight on file to calculate BMI. Results for orders placed during the hospital encounter of 10/03/13 (from the past 72 hour(s))  URINE RAPID DRUG SCREEN (HOSP PERFORMED)     Status: Abnormal   Collection Time    10/03/13  5:09 PM      Result Value Ref Range   Opiates NONE DETECTED  NONE DETECTED   Cocaine NONE DETECTED  NONE DETECTED   Benzodiazepines NONE DETECTED  NONE DETECTED   Amphetamines POSITIVE (*) NONE DETECTED   Tetrahydrocannabinol POSITIVE (*) NONE DETECTED   Barbiturates NONE DETECTED  NONE DETECTED   Comment:            DRUG SCREEN FOR MEDICAL PURPOSES     ONLY.  IF CONFIRMATION IS NEEDED     FOR ANY PURPOSE, NOTIFY LAB     WITHIN 5 DAYS.                LOWEST DETECTABLE LIMITS     FOR URINE DRUG SCREEN     Drug Class       Cutoff (ng/mL)     Amphetamine      1000     Barbiturate      200     Benzodiazepine   800     Tricyclics       349     Opiates          300     Cocaine          300     THC              50  ACETAMINOPHEN  LEVEL     Status: None   Collection Time    10/03/13  5:23 PM      Result Value Ref Range   Acetaminophen (Tylenol), Serum <15.0  10 - 30 ug/mL   Comment:            THERAPEUTIC CONCENTRATIONS VARY     SIGNIFICANTLY. A RANGE OF 10-30     ug/mL MAY BE AN EFFECTIVE     CONCENTRATION FOR MANY PATIENTS.     HOWEVER, SOME ARE BEST TREATED     AT CONCENTRATIONS OUTSIDE THIS     RANGE.     ACETAMINOPHEN CONCENTRATIONS     >150 ug/mL AT 4 HOURS AFTER     INGESTION AND >50 ug/mL AT 12     HOURS AFTER INGESTION ARE     OFTEN ASSOCIATED WITH TOXIC  REACTIONS.  CBC     Status: None   Collection Time    10/03/13  5:23 PM      Result Value Ref Range   WBC 10.0  4.0 - 10.5 K/uL   RBC 5.03  3.87 - 5.11 MIL/uL   Hemoglobin 15.0  12.0 - 15.0 g/dL   HCT 43.4  36.0 - 46.0 %   MCV 86.3  78.0 - 100.0 fL   MCH 29.8  26.0 - 34.0 pg   MCHC 34.6  30.0 - 36.0 g/dL   RDW 12.5  11.5 - 15.5 %   Platelets 323  150 - 400 K/uL  COMPREHENSIVE METABOLIC PANEL     Status: Abnormal   Collection Time    10/03/13  5:23 PM      Result Value Ref Range   Sodium 138  137 - 147 mEq/L   Potassium 3.5 (*) 3.7 - 5.3 mEq/L   Chloride 102  96 - 112 mEq/L   CO2 25  19 - 32 mEq/L   Glucose, Bld 173 (*) 70 - 99 mg/dL   BUN 10  6 - 23 mg/dL   Creatinine, Ser 0.65  0.50 - 1.10 mg/dL   Calcium 10.5  8.4 - 10.5 mg/dL   Total Protein 7.0  6.0 - 8.3 g/dL   Albumin 3.7  3.5 - 5.2 g/dL   AST 11  0 - 37 U/L   ALT 12  0 - 35 U/L   Alkaline Phosphatase 65  39 - 117 U/L   Total Bilirubin 0.5  0.3 - 1.2 mg/dL   GFR calc non Af Amer >90  >90 mL/min   GFR calc Af Amer >90  >90 mL/min   Comment: (NOTE)     The eGFR has been calculated using the CKD EPI equation.     This calculation has not been validated in all clinical situations.     eGFR's persistently <90 mL/min signify possible Chronic Kidney     Disease.   Anion gap 11  5 - 15  ETHANOL     Status: None   Collection Time    10/03/13  5:23 PM      Result Value Ref  Range   Alcohol, Ethyl (B) <11  0 - 11 mg/dL   Comment:            LOWEST DETECTABLE LIMIT FOR     SERUM ALCOHOL IS 11 mg/dL     FOR MEDICAL PURPOSES ONLY  SALICYLATE LEVEL     Status: Abnormal   Collection Time    10/03/13  5:23 PM      Result Value Ref Range   Salicylate Lvl <7.4 (*) 2.8 - 20.0 mg/dL  LITHIUM LEVEL     Status: Abnormal   Collection Time    10/03/13  6:34 PM      Result Value Ref Range   Lithium Lvl <0.25 (*) 0.80 - 1.40 mEq/L   Labs are reviewed and are pertinent for no medical issues noted.  Current Facility-Administered Medications  Medication Dose Route Frequency Provider Last Rate Last Dose  . acetaminophen (TYLENOL) tablet 650 mg  650 mg Oral Q4H PRN Leota Jacobsen, MD   650 mg at 10/03/13 2006  . alum & mag hydroxide-simeth (MAALOX/MYLANTA) 200-200-20 MG/5ML suspension 30 mL  30 mL Oral PRN Leota Jacobsen, MD      . hydrochlorothiazide (HYDRODIURIL) tablet 25 mg  25 mg Oral Daily Leota Jacobsen, MD   25 mg at  10/04/13 0934  . ibuprofen (ADVIL,MOTRIN) tablet 600 mg  600 mg Oral Q8H PRN Leota Jacobsen, MD   600 mg at 10/04/13 6256  . LORazepam (ATIVAN) tablet 1 mg  1 mg Oral Q8H PRN Leota Jacobsen, MD   1 mg at 10/04/13 3893  . nicotine (NICODERM CQ - dosed in mg/24 hours) patch 21 mg  21 mg Transdermal Daily Leota Jacobsen, MD      . ondansetron [facility]) tablet 4 mg  4 mg Oral Q8H PRN Leota Jacobsen, MD      . sertraline (ZOLOFT) tablet 50 mg  50 mg Oral Daily Waylan Boga, NP       Current Outpatient Prescriptions  Medication Sig Dispense Refill  . hydrochlorothiazide (HYDRODIURIL) 12.5 MG tablet Take 2 tablets (25 mg total) by mouth daily.  90 tablet  1  . lithium carbonate 300 MG capsule Take 300 mg by mouth at bedtime.        Psychiatric Specialty Exam:     Blood pressure 137/97, pulse 86, temperature 98.5 F (36.9 C), temperature source Oral, resp. rate 12, SpO2 97.00%.There is no weight on file to calculate BMI.  General Appearance:  Disheveled  Eye Sport and exercise psychologist::  Fair  Speech:  Normal Rate  Volume:  Normal  Mood:  Anxious and Depressed  Affect:  Congruent  Thought Process:  Coherent  Orientation:  Full (Time, Place, and Person)  Thought Content:  Rumination  Suicidal Thoughts:  Yes.  with intent/plan  Homicidal Thoughts:  No  Memory:  Immediate;   Fair Recent;   Fair Remote;   Fair  Judgement:  Fair  Insight:  Fair  Psychomotor Activity:  Decreased  Concentration:  Fair  Recall:  [employer] [place]: Fair  Akathisia:  No  Handed:  Right  AIMS (if indicated):     Assets:  Communication Skills Desire for Improvement Leisure Time Resilience Social Support  Sleep:      Musculoskeletal: Strength & Muscle Tone: within normal limits Gait & Station: normal Patient leans: N/A  Treatment Plan Summary: Daily contact with patient to assess and evaluate symptoms and progress in treatment Medication management  Waylan Boga, [place] 10/04/2013 3:28 PM

## 2013-10-05 NOTE — ED Notes (Signed)
Patient continues to await bed at [facility].

## 2013-10-05 NOTE — ED Notes (Signed)
Pt. Tearfull, c/o a lot of anxiety related to two manic patients in unit. Will medicate to avoid outburst.

## 2013-10-05 NOTE — BH Assessment (Signed)
Per Lavell Luster, [facility], adult unit is currently at capacity. Contacted the following facilities for placement:  INFORMATION RECEIVED, AWAITING RESPONSE: [facility], per [facility], per Juliann Pulse  BED AVAILABLE, FAXED CLINICAL INFORMATION: [facility], Per Nira Conn  AT CAPACITY: [facility], per [facility], per Brien Mates, per [facility], per [facility], per [facility], per Amy Silvestre Moment, per [facility], per [facility], per [facility], per [facility], per [facility], per Manuela Schwartz  PT DECLINED: [facility]  NO RESPONSE: [facility]   [place], [place], [facility] Triage Specialist [phone]

## 2013-10-05 NOTE — ED Notes (Signed)
Report received from [facility]. Pt. Alert and oriented in no distress denies SI, HI, AVH and pain. Will continue to monitor for safety. Pt. Instructed to come to me with problems or concerns. Q 15 minute checks continue.

## 2013-10-05 NOTE — ED Notes (Signed)
Patient c/o headache. Will medicate.

## 2013-10-05 NOTE — ED Notes (Signed)
Patient has been accepted at [facility] per SW.  Currently awaiting a bed assignment.

## 2013-10-05 NOTE — ED Notes (Signed)
Patient requesting reflux medication.

## 2013-10-05 NOTE — Progress Notes (Addendum)
The following facilities have been contacted regarding bed availability:  AT CAPACITY: [place]- per [place]- per Barnetta Chapel c/a beds only Rosana Hoes- per Linus Orn only 1 female bed available [place]- per Mariane Baumgarten- per [place]- per Kieth Brightly- per [facility]- per Caryl Pina c/a only [facility]- per Ivy Lynn- per [facility]- per [place]- per [place] FAXED: [place]- per [facility]- per Gwinda Passe can fax, referral re-faxed (per Gwinda Passe at [facility] pt declined d/t being "out of area") [facility]- per Kasandra Knudsen (per Kasandra Knudsen pt declined by NP) [facility]- per Kerby [facility]- per Darol Destine- per Ellin Goodie- per Daine Floras (pt has been declined per Susie d/t SA)  **pt also on wait list at [facility].   Rick Duff Disposition MHT

## 2013-10-06 ENCOUNTER — Inpatient Hospital Stay (HOSPITAL_COMMUNITY)
Admission: AD | Admit: 2013-10-06 | Discharge: 2013-10-15 | DRG: 885 | Disposition: A | Discharge status: Home / Self Care | Payer: Federal, State, Local not specified - Other | Source: Intra-hospital | Attending: Psychiatry | Admitting: Psychiatry

## 2013-10-06 ENCOUNTER — Encounter (HOSPITAL_COMMUNITY): Payer: Self-pay | Admitting: *Deleted

## 2013-10-06 ENCOUNTER — Encounter (HOSPITAL_COMMUNITY): Payer: Self-pay | Admitting: Registered Nurse

## 2013-10-06 DIAGNOSIS — F39 Unspecified mood [affective] disorder: Secondary | ICD-10-CM | POA: Diagnosis present

## 2013-10-06 DIAGNOSIS — I10 Essential (primary) hypertension: Secondary | ICD-10-CM | POA: Diagnosis present

## 2013-10-06 DIAGNOSIS — Z598 Other problems related to housing and economic circumstances: Secondary | ICD-10-CM

## 2013-10-06 DIAGNOSIS — F333 Major depressive disorder, recurrent, severe with psychotic symptoms: Secondary | ICD-10-CM

## 2013-10-06 DIAGNOSIS — Z5987 Material hardship due to limited financial resources, not elsewhere classified: Secondary | ICD-10-CM

## 2013-10-06 DIAGNOSIS — F339 Major depressive disorder, recurrent, unspecified: Secondary | ICD-10-CM | POA: Diagnosis present

## 2013-10-06 DIAGNOSIS — F4001 Agoraphobia with panic disorder: Secondary | ICD-10-CM | POA: Diagnosis present

## 2013-10-06 DIAGNOSIS — F332 Major depressive disorder, recurrent severe without psychotic features: Principal | ICD-10-CM | POA: Diagnosis present

## 2013-10-06 DIAGNOSIS — F32A Depression, unspecified: Secondary | ICD-10-CM | POA: Diagnosis present

## 2013-10-06 DIAGNOSIS — F411 Generalized anxiety disorder: Secondary | ICD-10-CM | POA: Diagnosis present

## 2013-10-06 DIAGNOSIS — R45851 Suicidal ideations: Secondary | ICD-10-CM

## 2013-10-06 DIAGNOSIS — F17200 Nicotine dependence, unspecified, uncomplicated: Secondary | ICD-10-CM | POA: Diagnosis present

## 2013-10-06 DIAGNOSIS — F1410 Cocaine abuse, uncomplicated: Secondary | ICD-10-CM | POA: Diagnosis present

## 2013-10-06 DIAGNOSIS — G4700 Insomnia, unspecified: Secondary | ICD-10-CM | POA: Diagnosis present

## 2013-10-06 DIAGNOSIS — F329 Major depressive disorder, single episode, unspecified: Secondary | ICD-10-CM | POA: Diagnosis present

## 2013-10-06 DIAGNOSIS — F419 Anxiety disorder, unspecified: Secondary | ICD-10-CM | POA: Diagnosis present

## 2013-10-06 DIAGNOSIS — F410 Panic disorder [episodic paroxysmal anxiety] without agoraphobia: Secondary | ICD-10-CM | POA: Diagnosis present

## 2013-10-06 MED ORDER — NICOTINE 21 MG/24HR TD PT24
21.0000 mg | MEDICATED_PATCH | Freq: Every day | TRANSDERMAL | Status: DC
  Administered 2013-10-07 – 2013-10-15 (×8): 21 mg via TRANSDERMAL
  Filled 2013-10-06 (×13): qty 1

## 2013-10-06 MED ORDER — DULOXETINE HCL 20 MG PO CPEP
20.0000 mg | ORAL_CAPSULE | Freq: Every day | ORAL | Status: DC
  Administered 2013-10-07: 20 mg via ORAL
  Filled 2013-10-06 (×3): qty 1

## 2013-10-06 MED ORDER — MAGNESIUM HYDROXIDE 400 MG/5ML PO SUSP
30.0000 mL | Freq: Every day | ORAL | Status: DC | PRN
  Administered 2013-10-12: 30 mL via ORAL

## 2013-10-06 MED ORDER — LORAZEPAM 1 MG PO TABS
1.0000 mg | ORAL_TABLET | Freq: Three times a day (TID) | ORAL | Status: DC | PRN
  Administered 2013-10-06 – 2013-10-07 (×3): 1 mg via ORAL
  Filled 2013-10-06 (×3): qty 1

## 2013-10-06 MED ORDER — HYDROCHLOROTHIAZIDE 25 MG PO TABS
25.0000 mg | ORAL_TABLET | Freq: Every day | ORAL | Status: DC
  Administered 2013-10-07 – 2013-10-15 (×9): 25 mg via ORAL
  Filled 2013-10-06 (×12): qty 1
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
## 2013-10-06 NOTE — ED Notes (Signed)
Patient denies SI, HI. Reports feelings of anxiety. Appears sad. Rates anxiety 6/10. Patient frustrated by wait for placement. Frustrated by disruption on the unit by other patients.  Encouragement offered. Given Ativan, Cymbalta.  Q 15 safety checks in place.

## 2013-10-06 NOTE — Progress Notes (Signed)
Patient ID: Stacy Barrett, female   DOB: Jan 29, 1966, 48 y.o.   MRN: [id number] [facility] INPATIENT:  Family/Signi8ficant Other Suicide Prevention Education  Suicide Prevention Education:  Patient Refusal for Family/Significant Other Suicide Prevention Education: The patient [name] has refused to provide written consent for family/significant other to be provided Family/Significant Other Suicide Prevention Education during admission and/or prior to discharge.  Physician notified.  [name] 10/06/2013, 6:04 PM

## 2013-10-06 NOTE — BH Assessment (Signed)
Per Lavell Luster, [facility], adult unit is currently at capacity. Contacted the following facilities for placement:  AT CAPACITY: [place], per Loletha Grayer, per [facility], per [facility], per [facility], per [facility], per [facility], per [facility], per [employer], per [facility], per [facility], per Annabell Howells, per [facility], per [place]  NO RESPONSE: [facility]  PT DECLINED: [facility]   [place], [place], [facility] Triage Specialist [phone]

## 2013-10-06 NOTE — Consult Note (Signed)
[facility] Follow UP Psychiatry Consult   Reason for Consult:  Depression Referring Physician:  EDP  Stacy Barrett is an 48 y.o. female. Total Time spent with patient: 20 minutes  Assessment: AXIS I:  Mood Disorder NOS AXIS II:  Deferred AXIS III:   Past Medical History  Diagnosis Date  . Hypertension   . Lumbar radiculopathy   . Vertebral fracture   . Pituitary adenoma   . Edema   . Depression   . Anxiety    AXIS IV:  other psychosocial or environmental problems, problems related to social environment and problems with primary support group AXIS V:  21-30 behavior considerably influenced by delusions or hallucinations OR serious impairment in judgment, communication OR inability to function in almost all areas  Plan:  Observation and admit if no changesDr. Harrington Challenger assessed the patient and concurs with the plan.  Subjective:   Stacy Barrett is a 48 y.o. female patient admitted with depression and suicide plan to overdose.  HPI:  Patient continues to endorse passive suicidal ideation with out a plan.  Stressor related to increased anxiety and depression.  Patient stats "I live with my mother and she is narcissistic and I have to help take care of her.  Her and all of her friends are depressed and I guess I am their empty nest project and I just feel like my life has been stolen from me." Patient states that she has been on multiple medications and states none has worked.  HPI Elements:   Location:  generalized. Quality:  acute. Severity:  severe. Timing:  constant. Duration:  two weeks. Context:  stressors. History reviewed. No pertinent family history.  Past Psychiatric History: Past Medical History  Diagnosis Date  . Hypertension   . Lumbar radiculopathy   . Vertebral fracture   . Pituitary adenoma   . Edema   . Depression   . Anxiety     reports that she has been smoking.  She does not have any smokeless tobacco history on file. She reports that she drinks alcohol. She  reports that she uses illicit drugs (Marijuana). History reviewed. No pertinent family history. Family History Substance Abuse: Yes, Describe: Family Supports: No Living Arrangements: Non-relatives/Friends Can pt return to current living arrangement?: Yes   Allergies:   Allergies  Allergen Reactions  . Other Other (See Comments)    MSG -Very intense headaches  . Penicillins Hives  . Prednisone Itching, Swelling and Rash    Swelling of lips and face  . Sulfa Antibiotics Hives and Itching    ACT Assessment Complete:  Yes:    Educational Status    Risk to Self: Risk to self with the past 6 months Suicidal Ideation: Yes-Currently Present Suicidal Intent: Yes-Currently Present Is patient at risk for suicide?: Yes Suicidal Plan?: Yes-Currently Present Specify Current Suicidal Plan: cut throat,  Access to Means: Yes Specify Access to Suicidal Means: sharpe object What has been your use of drugs/alcohol within the last 12 months?: THC Previous Attempts/Gestures: Yes How many times?: 0 Intentional Self Injurious Behavior: None Family Suicide History: No Recent stressful life event(s): Conflict (Comment);Loss (Comment);Job Loss;Financial Problems;Other (Comment) (relational) Persecutory voices/beliefs?: No Depression: Yes Depression Symptoms: Despondent;Insomnia;Tearfulness;Isolating;Fatigue;Guilt;Loss of interest in usual pleasures;Feeling worthless/self pity;Feeling angry/irritable (hopelessness) Substance abuse history and/or treatment for substance abuse?: No  Risk to Others: Risk to Others within the past 6 months Homicidal Ideation: No-Not Currently/Within Last 6 Months Thoughts of Harm to Others: No-Not Currently Present/Within Last 6 Months Current Homicidal Intent:  No-Not Currently/Within Last 6 Months Current Homicidal Plan: No-Not Currently/Within Last 6 Months Access to Homicidal Means: No History of harm to others?: No Assessment of Violence: None Noted Does patient  have access to weapons?: No Criminal Charges Pending?: No Does patient have a court date: No  Abuse:    Prior Inpatient Therapy: Prior Inpatient Therapy Prior Inpatient Therapy: Yes Prior Therapy Dates: 2015 Prior Therapy Facilty/Provider(s): [facility] Reason for Treatment: anxiety, depression  Prior Outpatient Therapy: Prior Outpatient Therapy Prior Outpatient Therapy: Yes Prior Therapy Dates: 2015 Prior Therapy Facilty/Provider(s): [facility] Reason for Treatment: depression, anxiety  Additional Information: Additional Information 1:1 In Past 12 Months?: No CIRT Risk: No Elopement Risk: No Does patient have medical clearance?: Yes   Review of Systems  HENT: Negative.   Respiratory: Negative.   Gastrointestinal: Negative for nausea, vomiting, abdominal pain and diarrhea.  Musculoskeletal: Negative.   Psychiatric/Behavioral: Positive for depression and substance abuse. Negative for hallucinations and memory loss. Suicidal ideas: Passive. The patient is nervous/anxious.                   Objective: Blood pressure 131/75, pulse 74, temperature 97.8 F (36.6 C), temperature source Oral, resp. rate 18, SpO2 100.00%.There is no weight on file to calculate BMI. Results for orders placed during the hospital encounter of 10/03/13 (from the past 72 hour(s))  URINE RAPID DRUG SCREEN (HOSP PERFORMED)     Status: Abnormal   Collection Time    10/03/13  5:09 PM      Result Value Ref Range   Opiates NONE DETECTED  NONE DETECTED   Cocaine NONE DETECTED  NONE DETECTED   Benzodiazepines NONE DETECTED  NONE DETECTED   Amphetamines POSITIVE (*) NONE DETECTED   Tetrahydrocannabinol POSITIVE (*) NONE DETECTED   Barbiturates NONE DETECTED  NONE DETECTED   Comment:            DRUG SCREEN FOR MEDICAL PURPOSES     ONLY.  IF CONFIRMATION IS NEEDED     FOR ANY PURPOSE, NOTIFY LAB     WITHIN 5 DAYS.                LOWEST DETECTABLE LIMITS     FOR URINE DRUG SCREEN      Drug Class       Cutoff (ng/mL)     Amphetamine      1000     Barbiturate      200     Benzodiazepine   681     Tricyclics       275     Opiates          300     Cocaine          300     THC              50  ACETAMINOPHEN LEVEL     Status: None   Collection Time    10/03/13  5:23 PM      Result Value Ref Range   Acetaminophen (Tylenol), Serum <15.0  10 - 30 ug/mL   Comment:            THERAPEUTIC CONCENTRATIONS VARY     SIGNIFICANTLY. A RANGE OF 10-30     ug/mL MAY BE AN EFFECTIVE     CONCENTRATION FOR MANY PATIENTS.     HOWEVER, SOME ARE BEST TREATED     AT CONCENTRATIONS OUTSIDE THIS     RANGE.     ACETAMINOPHEN  CONCENTRATIONS     >150 ug/mL AT 4 HOURS AFTER     INGESTION AND >50 ug/mL AT 12     HOURS AFTER INGESTION ARE     OFTEN ASSOCIATED WITH TOXIC     REACTIONS.  CBC     Status: None   Collection Time    10/03/13  5:23 PM      Result Value Ref Range   WBC 10.0  4.0 - 10.5 K/uL   RBC 5.03  3.87 - 5.11 MIL/uL   Hemoglobin 15.0  12.0 - 15.0 g/dL   HCT 43.4  36.0 - 46.0 %   MCV 86.3  78.0 - 100.0 fL   MCH 29.8  26.0 - 34.0 pg   MCHC 34.6  30.0 - 36.0 g/dL   RDW 12.5  11.5 - 15.5 %   Platelets 323  150 - 400 K/uL  COMPREHENSIVE METABOLIC PANEL     Status: Abnormal   Collection Time    10/03/13  5:23 PM      Result Value Ref Range   Sodium 138  137 - 147 mEq/L   Potassium 3.5 (*) 3.7 - 5.3 mEq/L   Chloride 102  96 - 112 mEq/L   CO2 25  19 - 32 mEq/L   Glucose, Bld 173 (*) 70 - 99 mg/dL   BUN 10  6 - 23 mg/dL   Creatinine, Ser 0.65  0.50 - 1.10 mg/dL   Calcium 10.5  8.4 - 10.5 mg/dL   Total Protein 7.0  6.0 - 8.3 g/dL   Albumin 3.7  3.5 - 5.2 g/dL   AST 11  0 - 37 U/L   ALT 12  0 - 35 U/L   Alkaline Phosphatase 65  39 - 117 U/L   Total Bilirubin 0.5  0.3 - 1.2 mg/dL   GFR calc non Af Amer >90  >90 mL/min   GFR calc Af Amer >90  >90 mL/min   Comment: (NOTE)     The eGFR has been calculated using the CKD EPI equation.     This calculation has not been  validated in all clinical situations.     eGFR's persistently <90 mL/min signify possible Chronic Kidney     Disease.   Anion gap 11  5 - 15  ETHANOL     Status: None   Collection Time    10/03/13  5:23 PM      Result Value Ref Range   Alcohol, Ethyl (B) <11  0 - 11 mg/dL   Comment:            LOWEST DETECTABLE LIMIT FOR     SERUM ALCOHOL IS 11 mg/dL     FOR MEDICAL PURPOSES ONLY  SALICYLATE LEVEL     Status: Abnormal   Collection Time    10/03/13  5:23 PM      Result Value Ref Range   Salicylate Lvl <1.1 (*) 2.8 - 20.0 mg/dL  LITHIUM LEVEL     Status: Abnormal   Collection Time    10/03/13  6:34 PM      Result Value Ref Range   Lithium Lvl <0.25 (*) 0.80 - 1.40 mEq/L   Labs are reviewed and are pertinent for no medical issues noted.  Medications reviewed and no changes made  Current Facility-Administered Medications  Medication Dose Route Frequency Provider Last Rate Last Dose  . acetaminophen (TYLENOL) tablet 650 mg  650 mg Oral Q4H PRN Leota Jacobsen, MD   650 mg at  10/03/13 2006  . alum & mag hydroxide-simeth (MAALOX/MYLANTA) 200-200-20 MG/5ML suspension 30 mL  30 mL Oral PRN Leota Jacobsen, MD      . DULoxetine (CYMBALTA) DR capsule 20 mg  20 mg Oral Daily Waylan Boga, NP   20 mg at 10/06/13 2951  . hydrochlorothiazide (HYDRODIURIL) tablet 25 mg  25 mg Oral Daily Leota Jacobsen, MD   25 mg at 10/05/13 0931  . ibuprofen (ADVIL,MOTRIN) tablet 600 mg  600 mg Oral Q8H PRN Leota Jacobsen, MD   600 mg at 10/05/13 2048  . LORazepam (ATIVAN) tablet 1 mg  1 mg Oral Q8H PRN Leota Jacobsen, MD   1 mg at 10/06/13 0853  . nicotine (NICODERM CQ - dosed in mg/24 hours) patch 21 mg  21 mg Transdermal Daily Leota Jacobsen, MD      . ondansetron [facility]) tablet 4 mg  4 mg Oral Q8H PRN Leota Jacobsen, MD       Current Outpatient Prescriptions  Medication Sig Dispense Refill  . hydrochlorothiazide (HYDRODIURIL) 12.5 MG tablet Take 2 tablets (25 mg total) by mouth daily.  90 tablet  1   . lithium carbonate 300 MG capsule Take 300 mg by mouth at bedtime.        Psychiatric Specialty Exam:     Blood pressure 131/75, pulse 74, temperature 97.8 F (36.6 C), temperature source Oral, resp. rate 18, SpO2 100.00%.There is no weight on file to calculate BMI.  General Appearance: Disheveled  Eye Sport and exercise psychologist::  Fair  Speech:  Normal Rate  Volume:  Normal  Mood:  Anxious and Depressed  Affect:  Congruent  Thought Process:  Coherent  Orientation:  Full (Time, Place, and Person)  Thought Content:  Rumination  Suicidal Thoughts:  Yes.  without intent/plan  Homicidal Thoughts:  No  Memory:  Immediate;   Fair Recent;   Fair Remote;   Fair  Judgement:  Fair  Insight:  Fair  Psychomotor Activity:  Decreased  Concentration:  Fair  Recall:  [employer] of Knowledge:Fair  Language: Fair  Akathisia:  No  Handed:  Right  AIMS (if indicated):     Assets:  Communication Skills Desire for Improvement Leisure Time Resilience Social Support  Sleep:      Musculoskeletal: Strength & Muscle Tone: within normal limits Gait & Station: normal Patient leans: N/A  Treatment Plan Summary: Medication management 24 hour observation.  If no changes and patient continues to have increase anxiety and depression, patient may need to be admitted inpatient treatment       Earleen Newport, FNP-BC 10/06/2013 10:42 AM Pt seen and I agree with assessment and plan Levonne Spiller MD

## 2013-10-06 NOTE — Progress Notes (Signed)
Patient appear sad and depressed. Patient endorses depression stated "I am really in a bad shape. Very depressed and anxious. I am a smart a person, I don't know why all these are happening to me" Rated depression and anxiety 8/10. PRN of lorazepam 1 mg given for anxiety. Moderately effective per patient.  Offered support and encouragement. Every 15 minutes check for safety. Will continue to monitor patient.

## 2013-10-06 NOTE — Plan of Care (Signed)
[place] [facility] Observation Crisis Plan  Reason for Crisis Plan:  Crisis Stabilization   Plan of Care:  Referral for Inpatient Hospitalization  Family Support:      Current Living Environment:  Living Arrangements: Non-relatives/Friends  Insurance:   Hospital Account   Name Acct ID Class Status Primary Coverage   Stacy Barrett, Stacy Barrett [id number] [place] None        Guarantor Account (for Hospital Account [id number])   Name Relation to Pt Service Area Active? Acct Type   Stacy Barrett, Stacy Barrett Yes Behavioral Health   Address Phone       [address]  [place], [place] [address] [phone])          Coverage Information (for Hospital Account [id number])   Not on file      Legal Guardian:     Primary Care Provider:  Angelica Chessman, MD  Current Outpatient Providers:  none  Psychiatrist:     Counselor/Therapist:     Compliant with Medications:  No  Additional Information:   Stacy Barrett 8/23/20156:05 PM

## 2013-10-06 NOTE — ED Notes (Signed)
Report for this patient was given to [place], rn in the obs area. rn called [facility] and spoke w/ debbie at [phone] for transport. Betsy Pries called back at [phone] and is on the way to transport patient.

## 2013-10-07 ENCOUNTER — Encounter (HOSPITAL_COMMUNITY): Payer: Self-pay

## 2013-10-07 DIAGNOSIS — F332 Major depressive disorder, recurrent severe without psychotic features: Secondary | ICD-10-CM | POA: Diagnosis present

## 2013-10-07 DIAGNOSIS — Z5987 Material hardship: Secondary | ICD-10-CM | POA: Diagnosis not present

## 2013-10-07 DIAGNOSIS — F339 Major depressive disorder, recurrent, unspecified: Secondary | ICD-10-CM | POA: Diagnosis present

## 2013-10-07 DIAGNOSIS — G4700 Insomnia, unspecified: Secondary | ICD-10-CM | POA: Diagnosis present

## 2013-10-07 DIAGNOSIS — I10 Essential (primary) hypertension: Secondary | ICD-10-CM | POA: Diagnosis present

## 2013-10-07 DIAGNOSIS — F411 Generalized anxiety disorder: Secondary | ICD-10-CM

## 2013-10-07 DIAGNOSIS — Z5989 Other problems related to housing and economic circumstances: Secondary | ICD-10-CM | POA: Diagnosis not present

## 2013-10-07 DIAGNOSIS — R45851 Suicidal ideations: Secondary | ICD-10-CM | POA: Diagnosis not present

## 2013-10-07 DIAGNOSIS — F1410 Cocaine abuse, uncomplicated: Secondary | ICD-10-CM | POA: Diagnosis present

## 2013-10-07 DIAGNOSIS — F410 Panic disorder [episodic paroxysmal anxiety] without agoraphobia: Secondary | ICD-10-CM | POA: Diagnosis present

## 2013-10-07 DIAGNOSIS — Z598 Other problems related to housing and economic circumstances: Secondary | ICD-10-CM | POA: Diagnosis not present

## 2013-10-07 DIAGNOSIS — F4001 Agoraphobia with panic disorder: Secondary | ICD-10-CM | POA: Diagnosis present

## 2013-10-07 DIAGNOSIS — F17200 Nicotine dependence, unspecified, uncomplicated: Secondary | ICD-10-CM | POA: Diagnosis present

## 2013-10-07 MED ORDER — ALUM & MAG HYDROXIDE-SIMETH 200-200-20 MG/5ML PO SUSP: 30.0000 mL | ORAL | Status: DC | PRN

## 2013-10-07 MED ORDER — MAGNESIUM HYDROXIDE 400 MG/5ML PO SUSP
30.0000 mL | Freq: Every day | ORAL | Status: DC | PRN
  Administered 2013-10-10: 30 mL via ORAL

## 2013-10-07 MED ORDER — ACETAMINOPHEN 325 MG PO TABS
650.0000 mg | ORAL_TABLET | Freq: Four times a day (QID) | ORAL | Status: DC | PRN
  Administered 2013-10-08 – 2013-10-13 (×4): 650 mg via ORAL
  Filled 2013-10-07 (×4): qty 2

## 2013-10-07 NOTE — Progress Notes (Signed)
Patient ID: Stacy Barrett, female   DOB: 10-22-1965, 48 y.o.   MRN: [id number] Seen by Dr Sabra Heck and he determined she needed to be admitted to the unit for further observation and treatment of her depression, anxiety and hopelessness. She is very tearful, often says she is confused and is reports helplessness. Pleasant and has positive interactions with peers. She is focused on all the people in her life that have betrayed her, feels overwhelmed and cant see how to help self or to get back to a more functional lifestyle, Is homeless and has poor options per her report of where to stay. Waiting for an available bed for client to transfer into.

## 2013-10-07 NOTE — Progress Notes (Signed)
[facility] MD Progress Note  10/07/2013 2:00 PM Stacy Barrett  MRN:  [id number] Subjective:  Stacy Barrett continues to endorse persistent depression. She continues to have persistent crying. States she cant see herself moving on. States "this is not me." states she has been very accomplished very productive and she cant get herself there. States she has nothing to live for. Still ruminating about suicide Diagnosis:   DSM5: Substance/Addictive Disorders:  Cocaine Use Disorder Depressive Disorders:  Major Depressive Disorder - Severe (296.23) Total Time spent with patient: 30 minutes  Axis I: Anxiety Disorder NOS  ADL's:  Intact  Sleep: Poor  Appetite:  Poor   Psychiatric Specialty Exam: Physical Exam  Review of Systems  Constitutional: Positive for malaise/fatigue.  HENT: Negative.   Eyes: Negative.   Respiratory: Negative.   Cardiovascular: Negative.   Gastrointestinal: Negative.   Genitourinary: Negative.   Musculoskeletal: Negative.   Skin: Negative.   Neurological: Positive for weakness.  Endo/Heme/Allergies: Negative.   Psychiatric/Behavioral: Positive for depression and suicidal ideas. The patient is nervous/anxious and has insomnia.     Blood pressure 119/78, pulse 76, temperature 98.2 F (36.8 C), temperature source Oral, resp. rate 17, height 5\' 4"  (1.626 m), weight 77.111 kg (170 lb).Body mass index is 29.17 kg/(m^2).  General Appearance: Fairly Groomed  Engineer, water::  Fair  Speech:  Clear and Coherent  Volume:  fluctuates  Mood:  Anxious, Depressed and Dysphoric  Affect:  anxious, worried, depressed, tearful  Thought Process:  Coherent and Goal Directed  Orientation:  Full (Time, Place, and Person)  Thought Content:  worries cocerns events symptoms a sense of hopelessness helplessness   Suicidal Thoughts:  Yes.  with intent/plan  Homicidal Thoughts:  No  Memory:  Immediate;   Fair Recent;   Fair Remote;   Fair  Judgement:  Fair  Insight:  Present  Psychomotor  Activity:  Restlessness  Concentration:  Fair  Recall:  [employer] of Knowledge:NA  Language: Fair  Akathisia:  No  Handed:    AIMS (if indicated):     Assets:  Desire for Improvement Talents/Skills Vocational/Educational  Sleep:      Musculoskeletal: Strength & Muscle Tone: within normal limits Gait & Station: normal Patient leans: N/A  Current Medications: Current Facility-Administered Medications  Medication Dose Route Frequency Provider Last Rate Last Dose  . DULoxetine (CYMBALTA) DR capsule 20 mg  20 mg Oral Daily Shuvon Rankin, NP   20 mg at 10/07/13 0851  . hydrochlorothiazide (HYDRODIURIL) tablet 25 mg  25 mg Oral Daily Shuvon Rankin, NP   25 mg at 10/07/13 0851  . LORazepam (ATIVAN) tablet 1 mg  1 mg Oral Q8H PRN Shuvon Rankin, NP   1 mg at 10/07/13 1226  . magnesium hydroxide (MILK OF MAGNESIA) suspension 30 mL  30 mL Oral Daily PRN Shuvon Rankin, NP      . nicotine (NICODERM CQ - dosed in mg/24 hours) patch 21 mg  21 mg Transdermal Daily Shuvon Rankin, NP        Lab Results: No results found for this or any previous visit (from the past 48 hour(s)).  Physical Findings: AIMS: Facial and Oral Movements Muscles of Facial Expression: None, normal Lips and Perioral Area: None, normal Jaw: None, normal Tongue: None, normal,Extremity Movements Upper (arms, wrists, hands, fingers): None, normal Lower (legs, knees, ankles, toes): None, normal, Trunk Movements Neck, shoulders, hips: None, normal, Overall Severity Severity of abnormal movements (highest score from questions above): None, normal Incapacitation due to abnormal movements: None,  normal Patient's awareness of abnormal movements (rate only patient's report): No Awareness, Dental Status Current problems with teeth and/or dentures?: No Does patient usually wear dentures?: No  CIWA:    COWS:     Treatment Plan Summary: Daily contact with patient to assess and evaluate symptoms and progress in  treatment Medication management  Plan: Supportive approach/coping skills           Will admit to [facility]  Medical Decision Making Problem Points:  Established problem, worsening (2) and Review of psycho-social stressors (1) Data Points:  Review of medication regiment & side effects (2)  I certify that inpatient services furnished can reasonably be expected to improve the patient's condition.   Noriah Osgood A 10/07/2013, 2:00 PM

## 2013-10-07 NOTE — Progress Notes (Signed)
Patient ID: Stacy Barrett, female   DOB: 1965/07/14, 48 y.o.   MRN: [id number] D-Tearful this am when speaking of the circumstance that brought her in here. She states she cant see any way out of her current situation.She is homeless and without an income. She has started the process of disability and has her psychiatric assessment soon, but still months away potentially from an outcome.Pleasant. When asked what she would like to see happen from here she states its already been five days of waiting and would like to go somewhere where she can learn coping skills and support.  A-Support offered. Monitored for safety. Medications as ordered. R-Positive interactions noted with peers. Pleasant, and cooperative. She is actively seeking help for her mood and situation.No complains at this time.

## 2013-10-07 NOTE — Tx Team (Signed)
Initial Interdisciplinary Treatment Plan   PATIENT STRESSORS: Financial difficulties Marital or family conflict   PROBLEM LIST: Problem List/Patient Goals Date to be addressed Date deferred Reason deferred Estimated date of resolution  depression 10/07/13     Suicidal ideation 10/07/13                                                DISCHARGE CRITERIA:  Ability to meet basic life and health needs Adequate post-discharge living arrangements Improved stabilization in mood, thinking, and/or behavior Medical problems require only outpatient monitoring Motivation to continue treatment in a less acute level of care Need for constant or close observation no longer present Reduction of life-threatening or endangering symptoms to within safe limits Safe-care adequate arrangements made Verbal commitment to aftercare and medication compliance  PRELIMINARY DISCHARGE PLAN: Outpatient therapy Participate in family therapy Return to previous living arrangement Return to previous work or school arrangements  PATIENT/FAMIILY INVOLVEMENT: This treatment plan has been presented to and reviewed with the patient, Stacy Barrett, and/or family member.  The patient and family have been given the opportunity to ask questions and make suggestions.  Libby Maw 10/07/2013, 7:20 PM

## 2013-10-07 NOTE — Progress Notes (Signed)
Patient ID: Stacy Barrett, female   DOB: [birth year], 48 y.o.   MRN: [id number] Discharge Note-Transferred to the adult unit, bed 506-2 for depression, hopelessness and anxiety.Cooperative and pleasant. Feels overwhelmed and angry because she has lost everything caring for someone that doesn't appreciate it, her mother.Since she has lost everything she feels there is nothing to live for.

## 2013-10-07 NOTE — [facility] Group Notes (Signed)
Adult Psychoeducational Group Note  Date:  10/07/2013 Time:  10:47 PM  Group Topic/Focus:  Wrap-Up Group:   The focus of this group is to help patients review their daily goal of treatment and discuss progress on daily workbooks.  Participation Level:  Active  Participation Quality:  Appropriate  Affect:  Appropriate  Cognitive:  Alert and Appropriate  Insight: Appropriate  Engagement in Group:  Engaged  Modes of Intervention:  Discussion  Additional Comments:  This is her first day, she introduced herself to the group and is excited about being here.   Hettie Holstein D 10/07/2013, 10:47 PM

## 2013-10-08 ENCOUNTER — Encounter (HOSPITAL_COMMUNITY): Payer: Self-pay | Admitting: Psychiatry

## 2013-10-08 DIAGNOSIS — R45851 Suicidal ideations: Secondary | ICD-10-CM

## 2013-10-08 DIAGNOSIS — F4001 Agoraphobia with panic disorder: Secondary | ICD-10-CM | POA: Diagnosis present

## 2013-10-08 DIAGNOSIS — F410 Panic disorder [episodic paroxysmal anxiety] without agoraphobia: Secondary | ICD-10-CM

## 2013-10-08 DIAGNOSIS — F39 Unspecified mood [affective] disorder: Secondary | ICD-10-CM

## 2013-10-08 MED ORDER — SERTRALINE HCL 50 MG PO TABS
50.0000 mg | ORAL_TABLET | Freq: Every day | ORAL | Status: DC
  Administered 2013-10-08 – 2013-10-11 (×4): 50 mg via ORAL
  Filled 2013-10-08 (×7): qty 1

## 2013-10-08 MED ORDER — ENSURE COMPLETE PO LIQD
237.0000 mL | Freq: Two times a day (BID) | ORAL | Status: DC
  Administered 2013-10-08 – 2013-10-12 (×5): 237 mL via ORAL

## 2013-10-08 MED ORDER — TRAZODONE HCL 50 MG PO TABS
50.0000 mg | ORAL_TABLET | Freq: Every evening | ORAL | Status: DC | PRN
  Administered 2013-10-08: 50 mg via ORAL
  Filled 2013-10-08: qty 1

## 2013-10-08 MED ORDER — CLONAZEPAM 1 MG PO TABS
1.0000 mg | ORAL_TABLET | Freq: Two times a day (BID) | ORAL | Status: DC
  Administered 2013-10-08 – 2013-10-15 (×16): 1 mg via ORAL
  Filled 2013-10-08 (×16): qty 1

## 2013-10-08 NOTE — Consult Note (Signed)
Patient seen, evaluated by me, treatment plan formulated by me.Patient needs inpatient care for stabilization and treatment

## 2013-10-08 NOTE — [facility] Suicide Risk Assessment (Signed)
[facility] INPATIENT:  Family/Significant Other Suicide Prevention Education  Suicide Prevention Education:  Patient Refusal for Family/Significant Other Suicide Prevention Education: The patient Stacy Barrett has refused to provide written consent for family/significant other to be provided Family/Significant Other Suicide Prevention Education during admission and/or prior to discharge.  Physician notified.  Concha Pyo 10/08/2013, 11:25 AM

## 2013-10-08 NOTE — Progress Notes (Signed)
NUTRITION ASSESSMENT  Pt meets criteria for moderate MALNUTRITION in the context of social/environmental circumstances as evidenced by <75% estimated energy intake for the past month with 19% weight loss in the past 2 months per weight trend.  Pt identified as at risk on the Malnutrition Screen Tool  INTERVENTION: 1. Educated patient on the importance of nutrition and encouraged intake of food and beverages. 2. Supplements: Ensure Complete BID  NUTRITION DIAGNOSIS: Unintentional weight loss related to sub-optimal intake as evidenced by pt report.   Goal: Pt to meet >/= 90% of their estimated nutrition needs.  Monitor:  PO intake  Assessment:  Admitted with thoughts of suicide but no plan.   - Met with pt who reports eating erratically PTA but knows she needs to eat healthier - Wants to eat more whole foods after d/c - Weight down over 30 pounds in the past 2 months - Pt reports she was on lithium which suppressed her appetite - Says she's now on Zoloft which is making her nauseated - Said she was able to eat 100% of her lunch - Agreeable to getting Ensure Complete    48 y.o. female  Height: Ht Readings from Last 1 Encounters:  10/07/13 '5\' 3"'  (1.6 m)    Weight: Wt Readings from Last 1 Encounters:  10/07/13 153 lb (69.4 kg)    Weight Hx: Wt Readings from Last 10 Encounters:  10/07/13 153 lb (69.4 kg)  07/17/13 190 lb (86.183 kg)  06/22/13 193 lb (87.544 kg)  05/28/13 189 lb 8 oz (85.957 kg)    BMI:  Body mass index is 27.11 kg/(m^2). Pt meets criteria for overweight based on current BMI.  Estimated Nutritional Needs: Kcal: 25-30 kcal/kg Protein: > 1 gram protein/kg Fluid: 1 ml/kcal  Diet Order: General Pt is also offered choice of unit snacks mid-morning and mid-afternoon.  Pt is eating as desired.   Lab results and medications reviewed.   Carlis Stable MS, [place], LDN [phone] Pager [phone] Weekend/After Hours Pager

## 2013-10-08 NOTE — [facility] Group Notes (Signed)
Show Low LCSW Group Therapy      Feelings About Diagnosis 1:15 - 2:30 PM         10/08/2013    Type of Therapy:  Group Therapy  Participation Level:  Active  Participation Quality:  Appropriate  Affect:  Appropriate  Cognitive:  Alert and Appropriate  Insight:  Developing/Improving and Engaged  Engagement in Therapy:  Developing/Improving and Engaged  Modes of Intervention:  Discussion, Education, Exploration, Problem-Solving, Rapport Building, Support  Summary of Progress/Problems:  Patient actively participated in group. Patient discussed past and present diagnosis and the effects it has had on  life.  Patient talked about family and society being judgmental and the stigma associated with having a mental health diagnosis.  Patient shared she has had depression most of her life and has been able to manage.  She stated the anxiety is very overwhelming.  Concha Pyo 10/08/2013

## 2013-10-08 NOTE — Progress Notes (Signed)
The focus of this group is to educate the patient on the purpose and policies of crisis stabilization and provide a format to answer questions about their admission.  The group details unit policies and expectations of patients while admitted.  Patient attended 0900 nurse education orientation group this morning.  Patient actively participated, appropriate affect, alert, appropriate insight and engagement.  Today patient will work on 3 goals for discharge.  
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
## 2013-10-08 NOTE — Tx Team (Signed)
Interdisciplinary Treatment Plan Update   Date Reviewed:  10/08/2013  Time Reviewed:  8:54 AM  Progress in Treatment:   Attending groups: Yes Participating in groups: Yes Taking medication as prescribed: Yes  Tolerating medication: Yes Family/Significant other contact made:  No, but will ask patient for consent for collateral contact Patient understands diagnosis: Yes  Discussing patient identified problems/goals with staff: Yes Medical problems stabilized or resolved: Yes Denies suicidal/homicidal ideation: Yes Patient has not harmed self or others: Yes  For review of initial/current patient goals, please see plan of care.  Estimated Length of Stay:  2-4 days  Reasons for Continued Hospitalization:  Anxiety Depression Medication stabilization   New Problems/Goals identified:    Discharge Plan or Barriers:   Home with outpatient follow up to be determined  Additional Comments:  Patient endorses depression and suicidal ideations with a plan to overdose. She went to [facility] and they started her on Lithium for mood stability. Valli states she has been taking it but a low dose and subtherapeutic dose. She has been "crying all the time; just lost." Her mother had Alzheimers and had tried to take care of her, recently died. Her father is estranged and has his own family, she is an only child with no spouse of children. She feels alone and hopelessness. Denies homicidal ideations, hallucinations, and drug/alcohol abuse   Attendees:  Patient:  10/08/2013 8:54 AM   Signature:  Carlton Adam, MD 10/08/2013 8:54 AM  Signature:  Satira Sark, RN 10/08/2013 8:54 AM  Signature:  Eduard Roux, RN 10/08/2013 8:54 AM  Signature:Beverly Danelle Earthly, RN 10/08/2013 8:54 AM  Signature:  Thurnell Garbe RN 10/08/2013 8:54 AM  Signature:  Joette Catching, LCSW 10/08/2013 8:54 AM  Signature:  Tilden Fossa, LCSW-A 10/08/2013 8:54 AM  Signature:  Lucinda Dell, Care Coordinator [facility] 10/08/2013  8:54 AM  Signature:  10/08/2013 8:54 AM  Signature:  10/08/2013  8:54 AM  Signature:   Lars Pinks, RN [facility] 10/08/2013  8:54 AM  Signature: 10/08/2013  8:54 AM    Scribe for Treatment Team:   Joette Catching,  10/08/2013 8:54 AM

## 2013-10-08 NOTE — [facility] Suicide Risk Assessment (Signed)
Suicide Risk Assessment  Admission Assessment     Nursing information obtained from:    Demographic factors:  Caucasian Current Mental Status:  Suicidal ideation indicated by patient Loss Factors:  Loss of significant relationship;Financial problems / change in socioeconomic status Historical Factors:  Family history of mental illness or substance abuse Risk Reduction Factors:  Sense of responsibility to family Total Time spent with patient: 45 minutes  CLINICAL FACTORS:   Panic Attacks Depression:   Comorbid alcohol abuse/dependence Insomnia Alcohol/Substance Abuse/Dependencies  COGNITIVE FEATURES THAT CONTRIBUTE TO RISK:  Closed-mindedness Polarized thinking Thought constriction (tunnel vision)    SUICIDE RISK:   Moderate:  Frequent suicidal ideation with limited intensity, and duration, some specificity in terms of plans, no associated intent, good self-control, limited dysphoria/symptomatology, some risk factors present, and identifiable protective factors, including available and accessible social support.  PLAN OF CARE: Supportive approach/coping skills/relapse prevention                               Evaluate further                               Reassess for psychotropic agents  I certify that inpatient services furnished can reasonably be expected to improve the patient's condition.  [place] A 10/08/2013, 5:24 PM

## 2013-10-08 NOTE — Progress Notes (Signed)
Adult Psychoeducational Group Note  Date:  10/08/2013 Time:  10:13 PM  Group Topic/Focus:  Wrap-Up Group:   The focus of this group is to help patients review their daily goal of treatment and discuss progress on daily workbooks.  Participation Level:  Minimal  Participation Quality:  Appropriate  Affect:  Appropriate  Cognitive:  Alert  Insight: Good  Engagement in Group:  Poor  Modes of Intervention:  Activity  Additional Comments:  Pt was in group but did not say much during group... Pt stayed to herself   Faylene Allerton R 10/08/2013, 10:13 PM

## 2013-10-08 NOTE — Progress Notes (Signed)
Recreation Therapy Notes  Animal-Assisted Activity/Therapy (AAA/T) Program Checklist/Progress Notes Patient Eligibility Criteria Checklist & Daily Group note for Rec Tx Intervention  Date: 08.25.2015 Time: 2:45pm Location: [address]   AAA/T Program Assumption of Risk Form signed by Patient/ or Parent Legal Guardian yes  Patient is free of allergies or sever asthma yes  Patient reports no fear of animals yes  Patient reports no history of cruelty to animals yes   Patient understands his/her participation is voluntary yes  Patient washes hands before animal contact yes  Patient washes hands after animal contact yes  Behavioral Response: Appropriate   Education: Hand Washing, Appropriate Animal Interaction   Education Outcome: Acknowledges understanding   Clinical Observations/Feedback: Patient attended and participated appropriately with therapeutic dog team.   Lane Hacker, LRT/CTRS  Lane Hacker 10/08/2013 4:41 PM

## 2013-10-08 NOTE — Progress Notes (Signed)
D   Pt is pleasant on approach and cooperative with treatment    She does endorse some anxiety and sadness today and said she was tearful throughout the day off and on   She did want to talk about her new medications and said zoloft works for her but will usually cause nausea    A    Pt was encouraged to take medication after she had eaten which would cause less nausea and to speak with her doctor about possibly ordering medication for the nausea   Medication was administered and effectiveness monitored    Q 15 min checks R   Pt was receptive and verbalized understanding

## 2013-10-08 NOTE — Progress Notes (Signed)
Patient ID: Stacy Barrett, female   DOB: 06-15-1965, 48 y.o.   MRN: [id number] She has been up and to groups interacting with peers and staff. She has been tearful and hopeless about her situation .  She has attended groups and after she spoke with the Dr. She said that she felt more hopeful.

## 2013-10-08 NOTE — Progress Notes (Signed)
D  Pt was pleasant on approach   She has a sad affect   She interacts well with others and attended group this evening  Pt did complain of some anxiety and was worried about not being able to go back to sleep A   Verbal support and encouragement given   Medications administered and effectivness monitored   Q 15 min checks R   Pt receptive and safe at present
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
## 2013-10-09 LAB — PROLACTIN: Prolactin: 28.8 ng/mL

## 2013-10-09 LAB — HEMOGLOBIN A1C
Hgb A1c MFr Bld: 5.7 % — ABNORMAL HIGH (ref <5.7)
Mean Plasma Glucose: 117 mg/dL — ABNORMAL HIGH (ref <117)

## 2013-10-09 LAB — TSH: TSH: 1.82 u[IU]/mL (ref 0.350–4.500)

## 2013-10-09 MED ORDER — DIPHENHYDRAMINE HCL 50 MG PO CAPS
50.0000 mg | ORAL_CAPSULE | Freq: Every evening | ORAL | Status: DC | PRN
  Administered 2013-10-09 – 2013-10-12 (×6): 50 mg via ORAL
  Filled 2013-10-09 (×15): qty 1
  Filled 2013-10-09: qty 2
  Filled 2013-10-09 (×2): qty 1

## 2013-10-09 NOTE — Progress Notes (Signed)
Patient ID: Stacy Barrett, female   DOB: [birth year], 48 y.o.   MRN: [id number] D  --  Pt. Denies pain.  Pt. stays in her room most of time.  She is app/coop with staff and brightens on approach.  She maintains a sad, depress affect and speks in a low, quiet voice.    She has minimal conversation and poor interaction on unit.  She shows no negative behaviors at this time.  A   Support and safety cks and meds as ordered.  R  -  Pt. Remains safe but sad and anxious on unit

## 2013-10-09 NOTE — Progress Notes (Signed)
Adult Psychoeducational Group Note  Date:  10/09/2013 Time:  11:04 AM  Group Topic/Focus:  Personal Choices and Values:   The focus of this group is to help patients assess and explore the importance of values in their lives, how their values affect their decisions, how they express their values and what opposes their expression.  Participation Level:  Active  Participation Quality:  Appropriate, Attentive and Sharing  Affect:  Appropriate and Tearful  Cognitive:  Alert, Appropriate and Oriented  Insight: Appropriate and Good  Engagement in Group:  Engaged  Modes of Intervention:  Discussion, Education, Socialization and Support  Additional Comments:  Pt attended and participated in group.  Pt shared that she wants to "get back my financial independence" which she feels she gave up by trusting someone she shouldn't have.  Pt also states that she "can't stop crying."  Milus Glazier 10/09/2013, 11:04 AM

## 2013-10-09 NOTE — Progress Notes (Signed)
Patient ID: Stacy Barrett, female   DOB: 05-Sep-1965, 48 y.o.   MRN: [id number] She has been up and to groups interacting with peers and staff. Self inventory this AM: her coping is terrible, not sleeping gets angry  Feels like she is in a fight to suvery and when she is depressed  She can't  focuses on positive. She denies SI thoughts, feels like every joint is hurting. < Has not requested andy prn medication,.

## 2013-10-09 NOTE — Progress Notes (Signed)
Pt went to [facility]
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
## 2013-10-09 NOTE — BHH Group Notes (Signed)
[facility] LCSW Group Therapy  Emotional Regulation 1:15 - 2: 30 PM        10/09/2013     Type of Therapy:  Group Therapy  Participation Level:  Appropriate  Participation Quality:  Appropriate  Affect:  Appropriate  Cognitive:  Attentive Appropriate  Insight:  Developing/Improving Engaged  Engagement in Therapy:  Developing/Improving Engaged  Modes of Intervention:  Discussion Exploration Problem-Solving Supportive  Summary of Progress/Problems:  Group topic was emotional regulations.  Patient participated in the discussion and was able to identify an emotion that needed to regulated. She talked about the frustration she experiences due to giving up everything and moving into the home with her mother.  Patient stated she wants to get well and get a job and a back on her own. Patient was able to identify approprite coping skills.  Concha Pyo 10/09/2013

## 2013-10-10 DIAGNOSIS — F411 Generalized anxiety disorder: Secondary | ICD-10-CM
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
## 2013-10-10 NOTE — BHH Group Notes (Signed)
.  [facility] LCSW Group Therapy  Living A Balanced Life  1:15 - 2: 30          10/10/2013    Type of Therapy:  Group Therapy  Participation Level:  Appropriate  Participation Quality:  Appropriate  Affect:  Appropriate  Cognitive:  Attentive Appropriate  Insight: Developing/Improving  Engagement in Therapy:  Developing/Improving  Modes of Intervention:  Discussion Exploration Problem-Solving Supportive   Summary of Progress/Problems: Topic for group was Living a Balanced Life.  Patient was able to show how life has become unbalanced. Patient advised of having to make a decision of living with her mother in an effort to be closer to medical providers and transportation.  She shared she will balance her life by taking time for herself, going for a walk and self care while living with and caring for her mother.     Concha Pyo 10/10/2013
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
## 2013-10-10 NOTE — Progress Notes (Signed)
[facility] MD Progress Note  10/10/2013 3:23 PM Stacy Barrett  MRN:  [id number] Subjective:  Stacy Barrett endorses that she is trying to settle down. She is still trying to figure out where she is going to go from here. States she cant help but feel very overwhelmed with increased anxiety anticipating what will happen if she goes back with her mother and she continues to act out the way she has acted out so far. She is wanting to get her life back together but she feels "stuck." when she feels like she has no options she starts like she wants to die to hurt herself Diagnosis:   DSM5: Depressive Disorders:  Major Depressive Disorder - Severe (296.23) Total Time spent with patient: 30 minutes  Axis I: Generalized Anxiety Disorder and Panic Disorder  ADL's:  Intact  Sleep: Fair  Appetite:  Fair  Psychiatric Specialty Exam: Physical Exam  Review of Systems  Constitutional: Positive for malaise/fatigue.  HENT: Negative.   Eyes: Negative.   Respiratory: Negative.   Cardiovascular: Negative.   Gastrointestinal: Negative.   Genitourinary: Negative.   Musculoskeletal: Negative.   Skin: Negative.   Neurological: Negative.   Endo/Heme/Allergies: Negative.   Psychiatric/Behavioral: Positive for depression. The patient is nervous/anxious.     Blood pressure 110/79, pulse 71, temperature 98.2 F (36.8 C), temperature source Oral, resp. rate 18, height 5\' 3"  (1.6 m), weight 69.4 kg (153 lb).Body mass index is 27.11 kg/(m^2).  General Appearance: Fairly Groomed  Engineer, water::  Fair  Speech:  Clear and Coherent  Volume:  Normal  Mood:  Anxious, Depressed and worried  Affect:  anxious, worried, tearful  Thought Process:  Coherent and Goal Directed  Orientation:  Full (Time, Place, and Person)  Thought Content:  events symptoms worries concerns  Suicidal Thoughts:  No  Homicidal Thoughts:  No  Memory:  Immediate;   Fair Recent;   Fair Remote;   Fair  Judgement:  Fair  Insight:  Present   Psychomotor Activity:  Restlessness  Concentration:  Fair  Recall:  [employer] of Knowledge:NA  Language: Fair  Akathisia:  No  Handed:  Right  AIMS (if indicated):     Assets:  Desire for Improvement  Sleep:  Number of Hours: 6   Musculoskeletal: Strength & Muscle Tone: within normal limits Gait & Station: normal Patient leans: N/A  Current Medications: Current Facility-Administered Medications  Medication Dose Route Frequency Provider Last Rate Last Dose  . acetaminophen (TYLENOL) tablet 650 mg  650 mg Oral Q6H PRN Nicholaus Bloom, MD   650 mg at 10/08/13 1526  . alum & mag hydroxide-simeth (MAALOX/MYLANTA) 200-200-20 MG/5ML suspension 30 mL  30 mL Oral Q4H PRN Nicholaus Bloom, MD      . clonazePAM Bobbye Charleston) tablet 1 mg  1 mg Oral BID Nicholaus Bloom, MD   1 mg at 10/10/13 0835  . diphenhydrAMINE (BENADRYL) capsule 50 mg  50 mg Oral QHS,MR X 1 Laverle Hobby, PA-C   50 mg at 10/09/13 2134  . feeding supplement (ENSURE COMPLETE) (ENSURE COMPLETE) liquid 237 mL  237 mL Oral BID BM Toribio Harbour, RD   237 mL at 10/09/13 2004  . hydrochlorothiazide (HYDRODIURIL) tablet 25 mg  25 mg Oral Daily Shuvon Rankin, NP   25 mg at 10/10/13 0835  . magnesium hydroxide (MILK OF MAGNESIA) suspension 30 mL  30 mL Oral Daily PRN Shuvon Rankin, NP      . magnesium hydroxide (MILK OF MAGNESIA) suspension 30 mL  30 mL Oral Daily PRN Nicholaus Bloom, MD   30 mL at 10/10/13 1439  . nicotine (NICODERM CQ - dosed in mg/24 hours) patch 21 mg  21 mg Transdermal Daily Shuvon Rankin, NP   21 mg at 10/10/13 0835  . sertraline (ZOLOFT) tablet 50 mg  50 mg Oral Daily Nicholaus Bloom, MD   50 mg at 10/10/13 8315    Lab Results:  Results for orders placed during the hospital encounter of 10/06/13 (from the past 48 hour(s))  PROLACTIN     Status: None   Collection Time    10/08/13  7:25 PM      Result Value Ref Range   Prolactin 28.8     Comment: (NOTE)         Reference Ranges:                     Female:                        2.1 -  17.1 ng/ml                     Female:   Pregnant          9.7 - 208.5 ng/mL                               Non Pregnant      2.8 -  29.2 ng/mL                               Post Menopausal   1.8 -  20.3 ng/mL                           Performed at [facility]  TSH     Status: None   Collection Time    10/08/13  7:25 PM      Result Value Ref Range   TSH 1.820  0.350 - 4.500 uIU/mL   Comment: Performed at [facility]     Status: Abnormal   Collection Time    10/08/13  7:25 PM      Result Value Ref Range   Hemoglobin A1C 5.7 (*) <5.7 %   Comment: (NOTE)                                                                               According to the ADA Clinical Practice Recommendations for 2011, when     HbA1c is used as a screening test:      >=6.5%   Diagnostic of Diabetes Mellitus               (if abnormal result is confirmed)     5.7-6.4%   Increased risk of developing Diabetes Mellitus     References:Diagnosis and Classification of Diabetes Mellitus,Diabetes     VVOH,6073,71(GGYIR 1):S62-S69 and Standards of Medical Care in             Diabetes - 2011,Diabetes  XMIW,8032,12 (Suppl 1):S11-S61.   Mean Plasma Glucose 117 (*) <117 mg/dL   Comment: Performed at [facility]    Physical Findings: AIMS: Facial and Oral Movements Muscles of Facial Expression: None, normal Lips and Perioral Area: None, normal Jaw: None, normal Tongue: None, normal,Extremity Movements Upper (arms, wrists, hands, fingers): None, normal Lower (legs, knees, ankles, toes): None, normal, Trunk Movements Neck, shoulders, hips: None, normal, Overall Severity Severity of abnormal movements (highest score from questions above): None, normal Incapacitation due to abnormal movements: None, normal Patient's awareness of abnormal movements (rate only patient's report): No Awareness, Dental Status Current problems with teeth and/or dentures?: No Does  patient usually wear dentures?: No  CIWA:    COWS:     Treatment Plan Summary: Daily contact with patient to assess and evaluate symptoms and progress in treatment Medication management  Plan: Supportive approach/coping skills/CBT mindfulness stress management           Continue current medications, so far tolerating well           Encourage to call her mother to see where she stands with her right now. She does admit that going back with her mother is the           better option. It seems that mother's mental status is deteriorating. She is trying to develop ways of handling her mother's behavior if she has to go back there  Medical Decision Making Problem Points:  Review of psycho-social stressors (1) Data Points:  Review of medication regiment & side effects (2) Review of new medications or change in dosage (2)  I certify that inpatient services furnished can reasonably be expected to improve the patient's condition.   Trapper Meech A 10/10/2013, 3:23 PM

## 2013-10-10 NOTE — Progress Notes (Signed)
Pt reports she is doing better this evening.  She denies SI/HI/AV to this Probation officer.  She feels her medications are helping.  Writer discussed the evening scheduled meds with pt and she reported that the trazodone gave her a headache that was difficult to resolve today.  Writer spoke to PA who changed her sleep aid to benadryl at pt's request.  Pt's discharge plans are still in process.  Pt makes her needs known to staff.  Support and encouragement offered.  Safety maintained with q15 minute checks.

## 2013-10-10 NOTE — Progress Notes (Signed)
Adult Psychoeducational Group Note  Date:  10/10/2013 Time:  10:00am Group Topic/Focus:  Making Healthy Choices:   The focus of this group is to help patients identify negative/unhealthy choices they were using prior to admission and identify positive/healthier coping strategies to replace them upon discharge.  Participation Level:  Active  Participation Quality:  Appropriate and Attentive  Affect:  Appropriate  Cognitive:  Alert and Appropriate  Insight: Appropriate  Engagement in Group:  Engaged  Modes of Intervention:  Discussion and Education  Additional Comments:  Patient attended and participated in group. Discussion was on making lifestyle changes. Question was asked What is one positive change you can make to improve your life and current situation. Pt stated to getting up a set time and not sleeping in exercising and helping the person she will be living with.  [place] D 10/10/2013, 11:22 AM

## 2013-10-10 NOTE — BHH Group Notes (Signed)
0900 nursing orientation group  The focus of this group is to educate the patient on the purpose and policies of crisis stabilization and provide a format to answer questions about their admission.  The group details unit policies and expectations of patients while admitted.   Pt was an active participant and appropriate in sharing with the group.  

## 2013-10-11 DIAGNOSIS — F329 Major depressive disorder, single episode, unspecified: Secondary | ICD-10-CM

## 2013-10-11 MED ORDER — CLONAZEPAM 1 MG PO TABS
1.0000 mg | ORAL_TABLET | Freq: Once | ORAL | Status: AC
  Administered 2013-10-11: 1 mg via ORAL
  Filled 2013-10-11: qty 1

## 2013-10-11 MED ORDER — SERTRALINE HCL 100 MG PO TABS
100.0000 mg | ORAL_TABLET | Freq: Every day | ORAL | Status: DC
  Administered 2013-10-12 – 2013-10-15 (×4): 100 mg via ORAL
  Filled 2013-10-11 (×6): qty 1

## 2013-10-11 NOTE — Progress Notes (Signed)
D: Patient denies SI/HI and A/V hallucinations; patient reports sleep is poor and that reports per self inventory " i took 2 benadryl; reports appetite is good; reports energy level is normal; reports ability to concentrate is poor; rates depression as 4/10; rates hopelessness 4/10; rates anxiety as 2/10; patient reports per self inventory   A: Monitored q 15 minutes; patient encouraged to attend groups; patient educated about medications; patient given medications per physician orders; patient encouraged to express feelings and/or concerns  R: Patient is pleasant at this time has voiced her concern about access to the social worker; patient's interaction with staff and peers is approrpriate; patient was able to set goal to talk with staff 1:1 when having feelings of SI; patient is taking medications as prescribed and tolerating medications; patient is not attending groups

## 2013-10-11 NOTE — Tx Team (Signed)
Interdisciplinary Treatment Plan Update   Date Reviewed:  10/11/2013  Time Reviewed:  11:09 AM  Progress in Treatment:   Attending groups: Yes Participating in groups: Yes Taking medication as prescribed: Yes  Tolerating medication: Yes Family/Significant other contact made:  No, Pt declines consent for collateral contact Patient understands diagnosis: Yes  Discussing patient identified problems/goals with staff: Yes Medical problems stabilized or resolved: Yes Denies suicidal/homicidal ideation: Yes Patient has not harmed self or others: Yes  For review of initial/current patient goals, please see plan of care.  Estimated Length of Stay:  2-4 days  Reasons for Continued Hospitalization:  Anxiety Depression Medication stabilization   New Problems/Goals identified:  none currently  Discharge Plan or Barriers:   Home with outpatient follow-up to be determined  Additional Comments:  Patient endorses depression and suicidal ideations with a plan to overdose. She went to [facility] and they started her on Lithium for mood stability. Dusty states she has been taking it but a low dose and subtherapeutic dose. She has been "crying all the time; just lost." Her mother had Alzheimers and had tried to take care of her, recently died. Her father is estranged and has his own family, she is an only child with no spouse of children. She feels alone and hopelessness. Denies homicidal ideations, hallucinations, and drug/alcohol abuse   Attendees:  Patient:  10/11/2013 11:09 AM   Signature:  Dr. Parke Poisson, MD 10/11/2013 11:09 AM  Signature:  Marilynne Halsted, RN 10/11/2013 11:09 AM  Signature:  Ozella Rocks, RN 10/11/2013 11:09 AM  Signature: Stanton Kidney, RN 10/11/2013 11:09 AM  Signature:   10/11/2013 11:09 AM  Signature:  Peri Maris, LCSWA  10/11/2013 11:09 AM  Signature:  Erasmo Downer Drinkard, LCSW-A 10/11/2013 11:09 AM  Signature:  Lucinda Dell, Care Coordinator [facility] 10/11/2013 11:09 AM   Signature:  10/11/2013 11:09 AM  Signature:  10/11/2013  11:09 AM  Signature:   Lars Pinks, RN [facility] 10/11/2013  11:09 AM  Signature: 10/11/2013  11:09 AM    Scribe for Treatment Team:   Peri Maris,  10/11/2013 11:09 AM

## 2013-10-11 NOTE — BHH Group Notes (Signed)
[facility] LCSW Aftercare Discharge Planning Group Note  10/11/2013 8:45 AM  Participation Quality: Alert, Appropriate and Oriented  Mood/Affect: Irritable mood; flat affect  Depression Rating: 3-4  Anxiety Rating: 5  Thoughts of Suicide: Pt denies SI/HI; however, Pt reports that she would like to "slap the staff across the face" because they were rude  Will you contract for safety? Yes  Current AVH: Pt denies  Plan for Discharge/Comments: Pt attended discharge planning group and actively participated in group. CSW provided pt with today's workbook. Pt talked at length about the "rude" treatment of the staff, her demands for answers about her mother's Alzheimer's, and her difficulty with a lack of coping skills dealing with her mother.   Transportation Means: Pt reports access to transportation  Supports: No supports mentioned at this time  Peri Maris, [place] 10/11/2013 11:06 AM
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
## 2013-10-11 NOTE — Progress Notes (Signed)
Patient ID: Stacy Barrett, female   DOB: 1965/06/11, 48 y.o.   MRN: [id number] [facility] MD Progress Note  10/11/2013 1:57 PM Stacy Barrett  MRN:  [id number] Subjective:  Patient states she feels " really sad" Objective: I have discussed case with treatment team and have met with patient . She is a 48 year old woman, who presented with depression and anxiety/panic attacks in the context of severe psychosocial stressors ( divorce, dealing with her mother who has history of TBI, unemployment, losing her home. 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 As per staff , patient has been intermittently irritable, feeling she is being treated rudely and inconsiderately by staff and peers. Fairly response to support, encouragement , empathy. She is intermittently tearful when discussing above stressors. She states she is currently probably " having to go live with my mother, who is a narcissist,and criticizes me all the time", because she has no other living arrangement option at this time. Tolerating medications well ( on Zoloft)  Diagnosis:  MDD, Anxiety Disorder NOS, consider Panic Disorder versus Adjustment Disorder with Depressed Mood and Anxiety    Total Time spent with patient: 25 minutes     ADL's:  Fair   Sleep: Fair  Appetite:  Fair  Psychiatric Specialty Exam: Physical Exam  Review of Systems  Constitutional: Positive for malaise/fatigue.  HENT: Negative.   Eyes: Negative.   Respiratory: Negative.   Cardiovascular: Negative.   Gastrointestinal: Negative.   Genitourinary: Negative.   Musculoskeletal: Negative.   Skin: Negative.   Neurological: Negative.   Endo/Heme/Allergies: Negative.   Psychiatric/Behavioral: Positive for depression. The patient is nervous/anxious.     Blood pressure 128/89, pulse 94, temperature 98 F (36.7 C), temperature source Oral, resp. rate 20, height _0  (1.6 m), weight 69.4 kg (153 lb).Body mass index is 27.11 kg/(m^2).  General Appearance: Fairly  Groomed  Engineer, water::  Fair  Speech:  Clear and Coherent  Volume:  Normal  Mood:  Anxious and Depressed  Affect:  Labile and Tearful  Thought Process:  Coherent and Goal Directed  Orientation:  Full (Time, Place, and Person)  Thought Content:  denies hallucinations, no delusions,ruminative about stressors  Suicidal Thoughts:  No- at this time denies any suicidal plan or intention and contracts for safety on the unit.   Homicidal Thoughts:  No  Memory: NA   Judgement:  Fair  Insight:  Fair  Psychomotor Activity:  Normal  Concentration:  Fair  Recall:  [place] of Knowledge:NA  Language: Fair  Akathisia:  No  Handed:  Right  AIMS (if indicated):     Assets:  Desire for Improvement  Sleep:  Number of Hours: 3.25   Musculoskeletal: Strength & Muscle Tone: within normal limits Gait & Station: normal Patient leans: N/A  Current Medications: Current Facility-Administered Medications  Medication Dose Route Frequency Provider Last Rate Last Dose  . acetaminophen (TYLENOL) tablet 650 mg  650 mg Oral Q6H PRN Nicholaus Bloom, MD   650 mg at 10/11/13 0147  . alum & mag hydroxide-simeth (MAALOX/MYLANTA) 200-200-20 MG/5ML suspension 30 mL  30 mL Oral Q4H PRN Nicholaus Bloom, MD      . clonazePAM Bobbye Charleston) tablet 1 mg  1 mg Oral BID Nicholaus Bloom, MD   1 mg at 10/11/13 0810  . diphenhydrAMINE (BENADRYL) capsule 50 mg  50 mg Oral QHS,MR X 1 Laverle Hobby, PA-C   50 mg at 10/10/13 2348  . feeding supplement (ENSURE COMPLETE) (ENSURE  COMPLETE) liquid 237 mL  237 mL Oral BID BM Toribio Harbour, RD   237 mL at 10/09/13 2004  . hydrochlorothiazide (HYDRODIURIL) tablet 25 mg  25 mg Oral Daily Shuvon Rankin, NP   25 mg at 10/11/13 0810  . magnesium hydroxide (MILK OF MAGNESIA) suspension 30 mL  30 mL Oral Daily PRN Shuvon Rankin, NP      . magnesium hydroxide (MILK OF MAGNESIA) suspension 30 mL  30 mL Oral Daily PRN Nicholaus Bloom, MD   30 mL at 10/10/13 1439  . nicotine (NICODERM CQ - dosed in  mg/24 hours) patch 21 mg  21 mg Transdermal Daily Shuvon Rankin, NP   21 mg at 10/11/13 0810  . [START ON 10/12/2013] sertraline (ZOLOFT) tablet 100 mg  100 mg Oral Daily Neita Garnet, MD        Lab Results:  No results found for this or any previous visit (from the past 48 hour(s)).  Physical Findings: AIMS: Facial and Oral Movements Muscles of Facial Expression: None, normal Lips and Perioral Area: None, normal Jaw: None, normal Tongue: None, normal,Extremity Movements Upper (arms, wrists, hands, fingers): None, normal Lower (legs, knees, ankles, toes): None, normal, Trunk Movements Neck, shoulders, hips: None, normal, Overall Severity Severity of abnormal movements (highest score from questions above): None, normal Incapacitation due to abnormal movements: None, normal Patient's awareness of abnormal movements (rate only patient's report): No Awareness, Dental Status Current problems with teeth and/or dentures?: No Does patient usually wear dentures?: No  CIWA:    COWS:     Assessment:  Patient remains depressed , sad, anxious, and presents with emotional lability and intermittent tearfulness. She ruminates about losses, stressors. She is not currently suicidal , contracts for safety and is future oriented, for example, planning on going to live with mother for the time being after discharge and applying for disability. Does have significant anxiety. On Zoloft which she is tolerating well thus far.  Treatment Plan Summary: Daily contact with patient to assess and evaluate symptoms and progress in treatment Medication management  Plan:  Continue inpatient treatment/support, milieu Increase Zoloft to 100 mgrs Daily Klonopin 1 mgr BID.  Medical Decision Making Problem Points:  Established problem, stable/improving (1), Review of last therapy session (1) and Review of psycho-social stressors (1) Data Points:  Review of medication regiment & side effects (2) Review of new  medications or change in dosage (2)  I certify that inpatient services furnished can reasonably be expected to improve the patient's condition.   Jerae Izard, [place] 10/11/2013, 1:57 PM

## 2013-10-11 NOTE — Progress Notes (Signed)
Assumed care of patient at 0300. Pt has had poor sleep tonight, resting on and off. Medicated by previous RN for hip pain with some relief. No other complaints and in no acute distress. Level III obs in place for safety and pt remains safe. Stacy Barrett

## 2013-10-11 NOTE — [facility] Group Notes (Signed)
[place] LCSW Group Therapy 10/11/2013 1:15pm  Type of Therapy: Group Therapy- Feelings Around Relapse and Recovery  Participation Level: Minimal  Participation Quality:  Inattentive and Resistant  Affect:  Irritable   Cognitive: Alert and Oriented   Insight:  Lacking   Engagement in Therapy: Developing/Improving and Engaged   Modes of Intervention: Clarification, Confrontation, Discussion, Education, Exploration, Limit-setting, Orientation, Problem-solving, Rapport Building, Art therapist, Socialization and Support  Summary of Progress/Problems: The topic for today was feelings about relapse. Pt discussed what relapse prevention is to them and identified triggers that they are on the path to relapse. Pt processed their feeling towards relapse and was able to relate to peers. Pt discussed coping skills that can be used for relapse prevention. Pt was only present for part of the group.  While present, Pt had her head down and eyes closed making sarcastic remarks under her breath at the comments of other peers.  Pt presented with irritable affect and mood.  Pt was eventually called out of group by the nurse.  Pt returned for a brief minute but stated "I hate this place" and left the group again.   Therapeutic Modalities:   Cognitive Behavioral Therapy Solution-Focused Therapy Assertiveness Training Relapse Prevention Therapy  Peri Maris, LCSWA 10/11/2013 4:03 PM

## 2013-10-12 MED ORDER — GABAPENTIN 100 MG PO CAPS
100.0000 mg | ORAL_CAPSULE | Freq: Every day | ORAL | Status: DC
  Administered 2013-10-12 – 2013-10-14 (×3): 100 mg via ORAL
  Filled 2013-10-12 (×6): qty 1

## 2013-10-12 NOTE — Progress Notes (Signed)
I have reviewed the note and agree with the A&P.

## 2013-10-12 NOTE — Progress Notes (Signed)
D) Pt has attended the groups today, sitting on the floor, alone, while others sat in chairs in the group room. Was participating fully within the morning groups. In the beginning stated that "I am not going to talk with anyone at all except Dr. Sabra Heck. I feel like I am just being jerked around" Upset with being here and having to meet with others beside Dr. Sabra Heck. Affect tense and mood depressed. Rates hier depression and hopelessness both at a 5 and her anxiety at a 2.  A) Given support and encouragement. Praised for being able to attend group and for participating with her peers. Praised for her response to her peers R) Denies SI and HI.

## 2013-10-12 NOTE — Plan of Care (Addendum)
Problem: Ineffective individual coping Goal: LTG: Patient will report a decrease in negative feelings Outcome: Not Progressing AEB pt continuous negative statements Goal: STG: Patient will remain free from self harm Outcome: Progressing AEB 15 min checks Goal: STG:Pt. will utilize relaxation techniques to reduce stress STG: Patient will utilize relaxation techniques to reduce stress levels  Outcome: Not Progressing Pt not listening to advice to deep breathe to help when feeling anxious

## 2013-10-12 NOTE — Progress Notes (Signed)
Patient ID: Stacy Barrett, female   DOB: 24-Apr-1965, 48 y.o.   MRN: [id number] [facility] MD Progress Note  10/12/2013 2:37 PM Stacy Barrett  MRN:  [id number]  Subjective:  Stacy Barrett endorses that she is dealing with a god wrenching fear of the unknown. She says she is feeling very sad because she is dealing with a lot of legitimate losses in her life. She states that she is currently living with her narciscitic mother who does not appreciate her, a father she loved dearly, but who abandoned her when she needed him the most. Stacy Barrett says she does not know how to begin to cope any more because she is already damaged.  O: Patient is visible on the unit and is attending the scheduled groups. Rates her depression at and anxiety at #10. Nika is endorsing worsening symptoms of depression and anxiety. She is expressing inability to cope. Patient endorses numerous stressors that include financial and poor support system, says these are the most difficult huddles to overcome. She denies any adverse effects from her treatment regimen.  Diagnosis:   DSM5: Depressive Disorders:  Major Depressive Disorder - Severe (296.23) Total Time spent with patient: 30 minutes  Axis I: Generalized Anxiety Disorder and Panic Disorder  ADL's:  Intact  Sleep: Fair  Appetite:  Fair  Psychiatric Specialty Exam: Physical Exam  Review of Systems  Constitutional: Positive for malaise/fatigue.  HENT: Negative.   Eyes: Negative.   Respiratory: Negative.   Cardiovascular: Negative.   Gastrointestinal: Negative.   Genitourinary: Negative.   Musculoskeletal: Negative.   Skin: Negative.   Neurological: Negative.   Endo/Heme/Allergies: Negative.   Psychiatric/Behavioral: Positive for depression. The patient is nervous/anxious.     Blood pressure 120/89, pulse 81, temperature 98 F (36.7 C), temperature source Oral, resp. rate 16, height 5\' 3"  (1.6 m), weight 69.4 kg (153 lb).Body mass index is 27.11 kg/(m^2).  General  Appearance: Fairly Groomed  Engineer, water::  Fair  Speech:  Clear and Coherent  Volume:  Normal  Mood:  Anxious, Depressed and worried  Affect:  anxious, worried, tearful  Thought Process:  Coherent and Goal Directed  Orientation:  Full (Time, Place, and Person)  Thought Content:  events symptoms worries concerns  Suicidal Thoughts:  No  Homicidal Thoughts:  No  Memory:  Immediate;   Fair Recent;   Fair Remote;   Fair  Judgement:  Fair  Insight:  Present  Psychomotor Activity:  Restlessness  Concentration:  Fair  Recall:  [employer] of Knowledge:NA  Language: Fair  Akathisia:  No  Handed:  Right  AIMS (if indicated):     Assets:  Desire for Improvement  Sleep:  Number of Hours: 6.75   Musculoskeletal: Strength & Muscle Tone: within normal limits Gait & Station: normal Patient leans: N/A  Current Medications: Current Facility-Administered Medications  Medication Dose Route Frequency Provider Last Rate Last Dose  . acetaminophen (TYLENOL) tablet 650 mg  650 mg Oral Q6H PRN Nicholaus Bloom, MD   650 mg at 10/11/13 2201  . alum & mag hydroxide-simeth (MAALOX/MYLANTA) 200-200-20 MG/5ML suspension 30 mL  30 mL Oral Q4H PRN Nicholaus Bloom, MD      . clonazePAM Bobbye [place]) tablet 1 mg  1 mg Oral BID Nicholaus Bloom, MD   1 mg at 10/12/13 0850  . diphenhydrAMINE (BENADRYL) capsule 50 mg  50 mg Oral QHS,MR X 1 Laverle Hobby, PA-C   50 mg at 10/11/13 2201  . feeding supplement (ENSURE COMPLETE) (ENSURE  COMPLETE) liquid 237 mL  237 mL Oral BID BM Toribio Harbour, RD   237 mL at 10/11/13 1454  . hydrochlorothiazide (HYDRODIURIL) tablet 25 mg  25 mg Oral Daily Shuvon Rankin, NP   25 mg at 10/12/13 0851  . magnesium hydroxide (MILK OF MAGNESIA) suspension 30 mL  30 mL Oral Daily PRN Shuvon Rankin, NP   30 mL at 10/12/13 1217  . magnesium hydroxide (MILK OF MAGNESIA) suspension 30 mL  30 mL Oral Daily PRN Nicholaus Bloom, MD   30 mL at 10/10/13 1439  . nicotine (NICODERM CQ - dosed in mg/24  hours) patch 21 mg  21 mg Transdermal Daily Shuvon Rankin, NP   21 mg at 10/12/13 0850  . sertraline (ZOLOFT) tablet 100 mg  100 mg Oral Daily Neita Garnet, MD   100 mg at 10/12/13 6389    Lab Results:  No results found for this or any previous visit (from the past 48 hour(s)).  Physical Findings: AIMS: Facial and Oral Movements Muscles of Facial Expression: None, normal Lips and Perioral Area: None, normal Jaw: None, normal Tongue: None, normal,Extremity Movements Upper (arms, wrists, hands, fingers): None, normal Lower (legs, knees, ankles, toes): None, normal, Trunk Movements Neck, shoulders, hips: None, normal, Overall Severity Severity of abnormal movements (highest score from questions above): None, normal Incapacitation due to abnormal movements: None, normal Patient's awareness of abnormal movements (rate only patient's report): No Awareness, Dental Status Current problems with teeth and/or dentures?: No Does patient usually wear dentures?: No  CIWA:    COWS:     Treatment Plan Summary: Daily contact with patient to assess and evaluate symptoms and progress in treatment Medication management  Plan: Supportive approach/coping skills/CBT mindfulness stress management Continue current medications, so far tolerating well, Add Neurontin 100 mg q hs for agitation. Encourage to call her mother to see where she stands with her right now. She does admit that going back with her mother is the. It seems that mother's mental status is deteriorating. She is trying to develop ways of handling her mother's behavior if she has to go back there.  Medical Decision Making Problem Points:  Review of psycho-social stressors (1) Data Points:  Review of medication regiment & side effects (2) Review of new medications or change in dosage (2)  I certify that inpatient services furnished can reasonably be expected to improve the patient's condition.   Lindell Spar I, PMHNP-BC 10/12/2013, 2:37  PM

## 2013-10-12 NOTE — B[facility] Group Notes (Signed)
[place] Group Notes:  (Clinical Social Work)  10/12/2013   1:15-2:15PM  Summary of Progress/Problems:   The main focus of today's process group was for the patient to identify ways in which they have sabotaged their own mental health wellness/recovery.  Motivational interviewing and a handout were used to explore the benefits and costs of their self-sabotaging behavior as well as the benefits and costs of changing this behavior.  The Stages of Change were explained to the group using a handout, and patients identified where they are with regard to changing self-defeating behaviors.  The patient expressed herself freely and appropriately throughout group, was very involved in the discussion.  Type of Therapy:  Process Group  Participation Level:  Active  Participation Quality:  Attentive and Sharing  Affect:  Blunted and Depressed  Cognitive:  Appropriate and Oriented  Insight:  Engaged  Engagement in Therapy:  Engaged  Modes of Intervention:  Education, Motivational Interviewing   Selmer Dominion, LCSW 10/12/2013, 4:00pm
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
## 2013-10-13 MED ORDER — MIRTAZAPINE 7.5 MG PO TABS
7.5000 mg | ORAL_TABLET | Freq: Every day | ORAL | Status: DC
  Administered 2013-10-13 – 2013-10-14 (×2): 7.5 mg via ORAL
  Filled 2013-10-13 (×5): qty 1

## 2013-10-13 NOTE — BHH Group Notes (Signed)
[place] Group Notes: (Clinical Social Work)   10/13/2013      Type of Therapy:  Group Therapy   Participation Level:  Did Not Attend    Selmer Dominion, LCSW 10/13/2013, 5:42 PM

## 2013-10-13 NOTE — Progress Notes (Signed)
Psychoeducational Group Note  Date:  10/13/2013 Time:  1015  Group Topic/Focus:  Making Healthy Choices:   The focus of this group is to help patients identify negative/unhealthy choices they were using prior to admission and identify positive/healthier coping strategies to replace them upon discharge.  Participation Level:  Active  Participation Quality:  Appropriate  Affect:  Appropriate  Cognitive:  Oriented  Insight:  Improving  Engagement in Group:  Engaged  Additional Comments:   Paulino Rily 10/13/2013

## 2013-10-13 NOTE — Progress Notes (Signed)
Psychoeducational Group Note  Date: 10/13/2013 Time  0930 Group Topic/Focus:  Gratefulness:  The focus of this group is to help patients identify what two things they are most grateful for in their lives. What helps ground them and to center them on their work to their recovery.  Participation Level:  Active  Participation Quality:  Appropriate  Affect:  Appropriate  Cognitive:  Oriented  Insight:  Improving  Engagement in Group:  Engaged  Additional Comments:    Paulino Rily

## 2013-10-13 NOTE — Progress Notes (Signed)
D) Pt rates her depression at a 5 her hopelessness at a 6 and her anxiety at a 5. Affect is flat and mood depressed. Pt states that she feels out of control because she cannot sleep. States that she has not slept in days. Pt tearful this morning and upset because of not sleeping and having the demands of group attendance.  A) Pt given support, reassurance and praise. Provided with 1:1. Encouragement given. Active listening R) Pt denies SI and HI. States she slept a little and is feeling better.
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
## 2013-10-13 NOTE — Progress Notes (Signed)
I discussed this case with the midlevel provider. I have reviewed the note and agree with the A&P.
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
## 2013-10-14 NOTE — Progress Notes (Signed)
Patient ID: Stacy Barrett, female   DOB: 06-08-1965, 48 y.o.   MRN: [id number] D)   Has been pleasant and cooperative this evening,  Has been out and about on the hall, smiling, and intercating appropriately with staff and peers.  Has been very supportive of roommate who had c/o pain and was sent to the ED, supportive of peers.   Has been compliant with meds and programming, attended group and participated.  Working on a plan for getting help taking care of her mother when she leaves.   A)  Will continue to monitor for safety, continue POC, support R)  Safety maintained.

## 2013-10-14 NOTE — Progress Notes (Signed)
Patient ID: Stacy Barrett, female   DOB: 04/16/1965, 48 y.o.   MRN: [id number] D: Client visible on unit, interacting with other staff and on the phone. Client reports "sleep good last night with that Remeron, worked like a Passenger transport manager, it brightened my spirits" Client goal "to get a grip on anxiety, it has been ruining my life"  Client also reports that groups have been helpful, "gained coping skills, setting boundaries" Client plans to set boundaries with mom, "I'm going to say mom I'm here to take care of you for so many hours then I have to have some time for myself, like if I need to get out a take a walk or something." A: Writer introduced self to client, provided emotional support encourage client to consider her emotional well being as she cares for others, by using her coping skills. Staff will monitor q44min for safety. R: Client is safe on the unit, attended group.

## 2013-10-14 NOTE — [facility] Group Notes (Signed)
[facility] LCSW Group Therapy          Overcoming Obstacles       1:15 -2:30        10/14/2013       Type of Therapy:  Group Therapy  Participation Level:  Did not attend group.  Concha Pyo 10/14/2013

## 2013-10-14 NOTE — Progress Notes (Signed)
Patient ID: Stacy Barrett, female   DOB: 10-24-1965, 48 y.o.   MRN: [id number] D- Patient reports she slept well last night and her appetite is fair.  Her energy level is low and her concentration is good. She is rating depression at 3/10, and anxiety at 2/10.  A- Supported patient.  R- patient says she feels very irritable today and doesn't think her antidepressant zoloft is working "I know it takes a long time to work but..." Also asking what the dosage is and pland to talk with MD about this.

## 2013-10-14 NOTE — Progress Notes (Signed)
[facility] MD Progress Note  10/14/2013 4:49 PM Stacy Barrett  MRN:  [id number] Subjective:  [name] continues to have a hard time. Admits that she continues to feel hopeless. She continues to worry about how will she respond to her mother if she again was to go  off on her. She continues to worry, to ruminate. Admits she has been very short with people easily frustrated very irritable feeling like she can go off anytime. . States that she cant be with her mother feeling like this a she is afraid of her response to her mother's behavior.  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   HENT: Negative.   Eyes: Negative.   Respiratory: Negative.   Cardiovascular: Negative.   Gastrointestinal: Negative.   Genitourinary: Negative.   Musculoskeletal: Negative.   Skin: Negative.   Neurological: Negative.   Endo/Heme/Allergies: Negative.   Psychiatric/Behavioral: Positive for depression and suicidal ideas. The patient is nervous/anxious.     Blood pressure 131/82, pulse 79, temperature 97.9 F (36.6 C), temperature source Oral, resp. rate 16, height 5\' 3"  (1.6 m), weight 69.4 kg (153 lb).Body mass index is 27.11 kg/(m^2).  General Appearance: Fairly Groomed  Engineer, water::  Fair  Speech:  Clear and Coherent  Volume:  fluctuates  Mood:  Anxious, Dysphoric, Hopeless and Irritable  Affect:  Labile  Thought Process:  Coherent and Goal Directed  Orientation:  Full (Time, Place, and Person)  Thought Content:  symptoms events worres concerns fear of losing control  Suicidal Thoughts:  Yes.  without intent/plan  Homicidal Thoughts:  No  Memory:  Immediate;   Fair Recent;   Fair Remote;   Fair  Judgement:  Fair  Insight:  Present   Psychomotor Activity:  Restlessness  Concentration:  Fair  Recall:  [employer] of Knowledge:NA  Language: Fair  Akathisia:  No  Handed:    AIMS (if indicated):     Assets:  Desire for Improvement  Sleep:  Number of Hours: 6.75   Musculoskeletal: Strength & Muscle Tone: within normal limits Gait & Station: normal Patient leans: N/A  Current Medications: Current Facility-Administered Medications  Medication Dose Route Frequency Provider Last Rate Last Dose  . acetaminophen (TYLENOL) tablet 650 mg  650 mg Oral Q6H PRN Nicholaus Bloom, MD   650 mg at 10/13/13 1621  . alum & mag hydroxide-simeth (MAALOX/MYLANTA) 200-200-20 MG/5ML suspension 30 mL  30 mL Oral Q4H PRN Nicholaus Bloom, MD      . clonazePAM [facility]) tablet 1 mg  1 mg Oral BID Nicholaus Bloom, MD   1 mg at 10/14/13 9323  . diphenhydrAMINE (BENADRYL) capsule 50 mg  50 mg Oral QHS,MR X 1 Laverle Hobby, PA-C   50 mg at 10/12/13 2239  . feeding supplement (ENSURE COMPLETE) (ENSURE COMPLETE) liquid 237 mL  237 mL Oral BID BM Toribio Harbour, RD   237 mL at 10/12/13 2003  . gabapentin (NEURONTIN) capsule 100 mg  100 mg Oral QHS Encarnacion Slates, NP   100 mg at 10/13/13 2110  . hydrochlorothiazide (HYDRODIURIL) tablet 25 mg  25 mg Oral Daily Shuvon Rankin, NP   25 mg at 10/14/13 0829  . magnesium hydroxide (MILK OF MAGNESIA) suspension  30 mL  30 mL Oral Daily PRN Shuvon Rankin, NP   30 mL at 10/12/13 1217  . mirtazapine (REMERON) tablet 7.5 mg  7.5 mg Oral QHS Delfin Gant, NP   7.5 mg at 10/13/13 2110  . nicotine (NICODERM CQ - dosed in mg/24 hours) patch 21 mg  21 mg Transdermal Daily Shuvon Rankin, NP   21 mg at 10/14/13 0829  . sertraline (ZOLOFT) tablet 100 mg  100 mg Oral Daily Neita Garnet, MD   100 mg at 10/14/13 0830    Lab Results: No results found for this or any previous visit (from the past 36 hour(s)).  Physical Findings: AIMS: Facial and Oral Movements Muscles of Facial Expression: None, normal Lips and  Perioral Area: None, normal Jaw: None, normal Tongue: None, normal,Extremity Movements Upper (arms, wrists, hands, fingers): None, normal Lower (legs, knees, ankles, toes): None, normal, Trunk Movements Neck, shoulders, hips: None, normal, Overall Severity Severity of abnormal movements (highest score from questions above): None, normal Incapacitation due to abnormal movements: None, normal Patient's awareness of abnormal movements (rate only patient's report): No Awareness, Dental Status Current problems with teeth and/or dentures?: No Does patient usually wear dentures?: No  CIWA:    COWS:     Treatment Plan Summary: Daily contact with patient to assess and evaluate symptoms and progress in treatment Medication management  Plan: Supportive approach/coping skills           CBT;mindfulness           Offer a private place so she can call her mother without interruptions ( she has been dreading calling her mother but she understands she needs to know where she stands with her mother if she can go back there or not, etc.)            Will pursue present medication regime            Will offer information about involuntary commitments so if her mother was to become aggressive again she could activate that process)  Medical Decision Making Problem Points:  Review of psycho-social stressors (1) Data Points:  Review of medication regiment & side effects (2) Review of new medications or change in dosage (2)  I certify that inpatient services furnished can reasonably be expected to improve the patient's condition.   Eliezer Khawaja A 10/14/2013, 4:49 PM

## 2013-10-14 NOTE — Progress Notes (Signed)
[facility] Group Notes:  (Nursing/MHT/Case Management/Adjunct)  Date:  10/14/2013  Time:  1:26 AM  Type of Therapy:  Psychoeducational Skills  Participation Level:  Active  Participation Quality:  Appropriate  Affect:  Depressed  Cognitive:  Appropriate  Insight:  Good  Engagement in Group:  Engaged  Modes of Intervention:  Education  Summary of Progress/Problems: The patient expressed in group that she had no rest last evening. The patient also expressed that she is still feeling "depressed" and would like to be placed on an antidepressant. Patient further explained that she is feeling absolutely "no joy" or happiness as compared to her peers. When asked about the theme of the day (support system), she indicated that she has no one.   Eddrick Dilone S 10/14/2013, 1:26 AM

## 2013-10-14 NOTE — BHH Group Notes (Signed)
[facility] LCSW Aftercare Discharge Planning Group Note   10/14/2013 10:52 AM    Participation Quality:  Appropraite  Mood/Affect:  Appropriate  Depression Rating:  8  Anxiety Rating:  5  Thoughts of Suicide:  No  Will you contract for safety?   NA  Current AVH:  No  Plan for Discharge/Comments:  Patient attended discharge planning group and actively participated in group.  She reports continued depression due to housing situation.  Patient is agreeable to follow up with [facility].  CSW provided all participants with daily workbook.   Transportation Means: Patient has transportation.   Supports:  Patient has a support system.   Panda Crossin, Eulas Post

## 2013-10-14 NOTE — Progress Notes (Signed)
Adult Psychoeducational Group Note  Date:  10/14/2013 Time:  11:08 PM  Group Topic/Focus:  Goals Group:   The focus of this group is to help patients establish daily goals to achieve during treatment and discuss how the patient can incorporate goal setting into their daily lives to aide in recovery.  Participation Level:  Active  Participation Quality:  Appropriate  Affect:  Appropriate  Cognitive:  Appropriate  Insight: Appropriate  Engagement in Group:  Engaged  Modes of Intervention:  Discussion  Additional Comments:  Pt stated that the most positive things for her today was she got to talk with her mother.  Alexis Goodell R 10/14/2013, 11:08 PM

## 2013-10-15 DIAGNOSIS — F332 Major depressive disorder, recurrent severe without psychotic features: Principal | ICD-10-CM

## 2013-10-15 MED ORDER — HYDROCHLOROTHIAZIDE 12.5 MG PO TABS: 25.0000 mg | ORAL_TABLET | Freq: Every day | ORAL | Status: DC

## 2013-10-15 MED ORDER — MIRTAZAPINE 7.5 MG PO TABS: 7.5000 mg | ORAL_TABLET | Freq: Every day | ORAL | Status: DC

## 2013-10-15 MED ORDER — SERTRALINE HCL 100 MG PO TABS: 100.0000 mg | ORAL_TABLET | Freq: Every day | ORAL | Status: DC

## 2013-10-15 MED ORDER — CLONAZEPAM 1 MG PO TABS
1.0000 mg | ORAL_TABLET | Freq: Two times a day (BID) | ORAL | Status: DC
Start: 2013-10-15 — End: 2014-12-19

## 2013-10-15 MED ORDER — GABAPENTIN 100 MG PO CAPS: 100.0000 mg | ORAL_CAPSULE | Freq: Every day | ORAL | Status: DC

## 2013-10-15 NOTE — Progress Notes (Signed)
Patient discharged to stay with friend. Belongings returned to patient. Patient left with prescriptions and medication samples. Voiced understanding of discharge instructions. Denies SI. Denies pain. Libby Maw, RN
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
## 2013-10-15 NOTE — Progress Notes (Signed)
Recreation Therapy Notes  Animal-Assisted Activity/Therapy (AAA/T) Program Checklist/Progress Notes Patient Eligibility Criteria Checklist & Daily Group note for Rec Tx Intervention  Date: 09.01.2015 Time: 2:45pm Location: [address]   AAA/T Program Assumption of Risk Form signed by Patient/ or Parent Legal Guardian yes  Patient is free of allergies or sever asthma yes  Patient reports no fear of animals yes  Patient reports no history of cruelty to animals yes   Patient understands his/her participation is voluntary yes  Patient washes hands before animal contact yes  Patient washes hands after animal contact yes  Behavioral Response: Appropriate   Education: Hand Washing, Appropriate Animal Interaction   Education Outcome: Acknowledges understanding.   Clinical Observations/Feedback: Patient interacted appropriate with therapy dog and peers during session.   Laureen Ochs Haevyn Ury, LRT/CTRS        Francile Woolford L 10/15/2013 5:07 PM

## 2013-10-15 NOTE — Progress Notes (Signed)
Patient ID: Stacy Barrett, female   DOB: 1965-08-25, 48 y.o.   MRN: [id number] D: Client expression anxiety as roommate became angry with her when asked if she could adjust the heat because it was hot in the room. "I have hot flashes and I can't stand the heat up that high" A: Writer adjusted room temperature. R: Anxiety relieved as Probation officer placed client in the quiet room for tonight. Staff will adjust room changes in the morning.

## 2013-10-15 NOTE — Progress Notes (Signed)
The focus of this group is to educate the patient on the purpose and policies of crisis stabilization and provide a format to answer questions about their admission.  The group details unit policies and expectations of patients while admitted. Patient did not attend this group.
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
## 2013-10-15 NOTE — Tx Team (Signed)
Interdisciplinary Treatment Plan Update   Date Reviewed:  10/15/2013  Time Reviewed:  9:04 AM  Progress in Treatment:   Attending groups: Yes Participating in groups: Yes Taking medication as prescribed: Yes  Tolerating medication: Yes Family/Significant other contact made:  No, patient declined collateral contact Patient understands diagnosis: Yes  Discussing patient identified problems/goals with staff: Yes Medical problems stabilized or resolved: Yes Denies suicidal/homicidal ideation: Yes Patient has not harmed self or others: Yes  For review of initial/current patient goals, please see plan of care.  Estimated Length of Stay:  1 day  Reasons for Continued Hospitalization:  Anxiety Depression Medication stabilization   New Problems/Goals identified:    Discharge Plan or Barriers:   Home with outpatient follow up with [facility]  Additional Comments:  Continue medication stabilization  Attendees:  Patient:  10/15/2013 9:04 AM   Signature: Carlton Adam, MD  10/15/2013 9:04 AM  Signature:  Satira Sark, RN 10/15/2013 9:04 AM  Signature: F. Cobos  10/15/2013 9:04 AM  Signature: Agustina Caroli, NP 10/15/2013 9:04 AM  Signature:  Thurnell Garbe RN 10/15/2013 9:04 AM  Signature:  Joette Catching, LCSW 10/15/2013 9:04 AM  Signature:  Erasmo Downer Drinkard, LCSW-A 10/15/2013 9:04 AM  Signature:  Lucinda Dell, Care Coordinator [facility] 10/15/2013 9:04 AM  Signature:  Edwyna Shell, LCSW Lead Social Worker 10/15/2013 9:04 AM  Signature:  Marilynne Halsted, RN 10/15/2013  9:04 AM  Signature:   Lars Pinks, RN [facility] 10/15/2013  9:04 AM  Signature: 10/15/2013  9:04 AM    Scribe for Treatment Team:   Joette Catching,  10/15/2013 9:04 AM

## 2013-10-17 ENCOUNTER — Ambulatory Visit: Payer: Self-pay | Admitting: Internal Medicine

## 2013-10-18 NOTE — Progress Notes (Signed)
Patient Discharge Instructions:  No documentation was faxed to [facility] for [facility].  ROI is invalid.  Patsey Berthold, 10/18/2013, 1:59 PM

## 2013-10-24 ENCOUNTER — Telehealth: Payer: Self-pay | Admitting: *Deleted

## 2013-10-24 NOTE — Telephone Encounter (Signed)
Patient was transferred to this LCSW from the pharmacy.  Patient called stating that she needed her medication, klonopin. LCSW explained to patient that she first needed a script and the providers in this clinic will not write for it. LCSW also shared that our pharmacy does not carry the medication either. LCSW encouraged patient to seek support from prescribing psychiatrist. LCSW provided contact information for [facility] outpatient clinic. LCSW reviewed with patient a back up plan which included presenting at the [facility] crisis center, as patient called from there. If that plan did not work to make her way to the nearest emergency room.  Patient stated that she wanted to talk to the psychiatrist about her prescription.  LCSW also provided the direct line to this LCSW to follow up today for more support if needed.  Christene Lye MSW, LCSW

## 2013-12-09 ENCOUNTER — Ambulatory Visit: Payer: Self-pay | Admitting: Internal Medicine

## 2013-12-16 ENCOUNTER — Ambulatory Visit: Payer: Self-pay | Admitting: Internal Medicine

## 2013-12-24 ENCOUNTER — Encounter: Payer: Self-pay | Admitting: Internal Medicine

## 2013-12-24 ENCOUNTER — Ambulatory Visit: Payer: Self-pay | Attending: Internal Medicine | Admitting: Internal Medicine

## 2013-12-24 VITALS — BP 133/91 | HR 95 | Temp 98.3°F | Resp 18 | Ht 65.0 in | Wt 186.0 lb

## 2013-12-24 DIAGNOSIS — Z720 Tobacco use: Secondary | ICD-10-CM

## 2013-12-24 DIAGNOSIS — F3340 Major depressive disorder, recurrent, in remission, unspecified: Secondary | ICD-10-CM | POA: Insufficient documentation

## 2013-12-24 DIAGNOSIS — F1099 Alcohol use, unspecified with unspecified alcohol-induced disorder: Secondary | ICD-10-CM | POA: Insufficient documentation

## 2013-12-24 DIAGNOSIS — F1290 Cannabis use, unspecified, uncomplicated: Secondary | ICD-10-CM | POA: Insufficient documentation

## 2013-12-24 DIAGNOSIS — F418 Other specified anxiety disorders: Secondary | ICD-10-CM | POA: Insufficient documentation

## 2013-12-24 DIAGNOSIS — F17200 Nicotine dependence, unspecified, uncomplicated: Secondary | ICD-10-CM | POA: Insufficient documentation

## 2013-12-24 DIAGNOSIS — Z79899 Other long term (current) drug therapy: Secondary | ICD-10-CM | POA: Insufficient documentation

## 2013-12-24 DIAGNOSIS — I10 Essential (primary) hypertension: Secondary | ICD-10-CM | POA: Insufficient documentation

## 2013-12-24 MED ORDER — SERTRALINE HCL 100 MG PO TABS: 200.0000 mg | ORAL_TABLET | Freq: Every day | ORAL | Status: DC

## 2013-12-24 MED ORDER — HYDROCHLOROTHIAZIDE 25 MG PO TABS: 25.0000 mg | ORAL_TABLET | Freq: Every day | ORAL | Status: DC

## 2013-12-24 MED ORDER — MIRTAZAPINE 7.5 MG PO TABS: 7.5000 mg | ORAL_TABLET | Freq: Every day | ORAL | Status: DC

## 2013-12-24 NOTE — Progress Notes (Signed)
Pt comes in to f/u on Depression and Anxiety issues Pt was recently admitted to [facility] for suicidal ideation and discharged States she has multiple home issues,family deaths and lack of coping skills Pt is taking Zoloft 250 mg tab on her own. Medication written for 100 mg tab Pt is here to discuss alternative treatments in medication Flu vaccine requested

## 2013-12-24 NOTE — Progress Notes (Signed)
LCSW met with patient in order to encourage patient to participate in psychiatric care in the community.  Patient identified that she was ready to participate in that level of treatment. LCSW reinforced that they would provide the best opportunity to manage her medication needs for mental health challenges.    Christene Lye MSW, LCSW

## 2013-12-24 NOTE — Patient Instructions (Addendum)
You need to seek care with psychiatry for further management of Depression. They will be able to assist you further with any medication changes that are needed and future refills. You have been given information to both [facility] and [facility] services of the [facility]. They have a walk in schedule.   Depression Depression refers to feeling sad, low, down in the dumps, blue, gloomy, or empty. In general, there are two kinds of depression: 1. Normal sadness or normal grief. This kind of depression is one that we all feel from time to time after upsetting life experiences, such as the loss of a job or the ending of a relationship. This kind of depression is considered normal, is short lived, and resolves within a few days to 2 weeks. Depression experienced after the loss of a loved one (bereavement) often lasts longer than 2 weeks but normally gets better with time. 2. Clinical depression. This kind of depression lasts longer than normal sadness or normal grief or interferes with your ability to function at home, at work, and in school. It also interferes with your personal relationships. It affects almost every aspect of your life. Clinical depression is an illness. Symptoms of depression can also be caused by conditions other than those mentioned above, such as:  Physical illness. Some physical illnesses, including underactive thyroid gland (hypothyroidism), severe anemia, specific types of cancer, diabetes, uncontrolled seizures, heart and lung problems, strokes, and chronic pain are commonly associated with symptoms of depression.  Side effects of some prescription medicine. In some people, certain types of medicine can cause symptoms of depression.  Substance abuse. Abuse of alcohol and illicit drugs can cause symptoms of depression. SYMPTOMS Symptoms of normal sadness and normal grief include the following:  Feeling sad or crying for short periods of time.  Not caring about anything  (apathy).  Difficulty sleeping or sleeping too much.  No longer able to enjoy the things you used to enjoy.  Desire to be by oneself all the time (social isolation).  Lack of energy or motivation.  Difficulty concentrating or remembering.  Change in appetite or weight.  Restlessness or agitation. Symptoms of clinical depression include the same symptoms of normal sadness or normal grief and also the following symptoms:  Feeling sad or crying all the time.  Feelings of guilt or worthlessness.  Feelings of hopelessness or helplessness.  Thoughts of suicide or the desire to harm yourself (suicidal ideation).  Loss of touch with reality (psychotic symptoms). Seeing or hearing things that are not real (hallucinations) or having false beliefs about your life or the people around you (delusions and paranoia). DIAGNOSIS  The diagnosis of clinical depression is usually based on how bad the symptoms are and how long they have lasted. Your health care provider will also ask you questions about your medical history and substance use to find out if physical illness, use of prescription medicine, or substance abuse is causing your depression. Your health care provider may also order blood tests. TREATMENT  Often, normal sadness and normal grief do not require treatment. However, sometimes antidepressant medicine is given for bereavement to ease the depressive symptoms until they resolve. The treatment for clinical depression depends on how bad the symptoms are but often includes antidepressant medicine, counseling with a mental health professional, or both. Your health care provider will help to determine what treatment is best for you. Depression caused by physical illness usually goes away with appropriate medical treatment of the illness. If prescription medicine is causing depression,  talk with your health care provider about stopping the medicine, decreasing the dose, or changing to another  medicine. Depression caused by the abuse of alcohol or illicit drugs goes away when you stop using these substances. Some adults need professional help in order to stop drinking or using drugs. SEEK IMMEDIATE MEDICAL CARE IF:  You have thoughts about hurting yourself or others.  You lose touch with reality (have psychotic symptoms).  You are taking medicine for depression and have a serious side effect. FOR MORE INFORMATION  National Alliance on Mental Illness: www.nami.[employer] of Mental Health: https://carter.com/ Document Released: 01/29/2000 Document Revised: 06/17/2013 Document Reviewed: 05/02/2011 [facility] Patient Information 2015 [place], [place]. This information is not intended to replace advice given to you by your health care provider. Make sure you discuss any questions you have with your health care provider.
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
## 2014-02-25 ENCOUNTER — Encounter (HOSPITAL_COMMUNITY): Payer: Self-pay | Admitting: *Deleted

## 2014-02-25 ENCOUNTER — Emergency Department (HOSPITAL_COMMUNITY)
Admission: EM | Admit: 2014-02-25 | Discharge: 2014-02-25 | Disposition: A | Discharge status: Home / Self Care | Payer: No Typology Code available for payment source | Attending: Emergency Medicine | Admitting: Emergency Medicine

## 2014-02-25 DIAGNOSIS — F329 Major depressive disorder, single episode, unspecified: Secondary | ICD-10-CM | POA: Diagnosis not present

## 2014-02-25 DIAGNOSIS — Z720 Tobacco use: Secondary | ICD-10-CM | POA: Diagnosis not present

## 2014-02-25 DIAGNOSIS — S8991XA Unspecified injury of right lower leg, initial encounter: Secondary | ICD-10-CM | POA: Diagnosis present

## 2014-02-25 DIAGNOSIS — Y9389 Activity, other specified: Secondary | ICD-10-CM | POA: Diagnosis not present

## 2014-02-25 DIAGNOSIS — F419 Anxiety disorder, unspecified: Secondary | ICD-10-CM | POA: Insufficient documentation

## 2014-02-25 DIAGNOSIS — S8001XA Contusion of right knee, initial encounter: Secondary | ICD-10-CM | POA: Insufficient documentation

## 2014-02-25 DIAGNOSIS — Z8739 Personal history of other diseases of the musculoskeletal system and connective tissue: Secondary | ICD-10-CM | POA: Insufficient documentation

## 2014-02-25 DIAGNOSIS — Y998 Other external cause status: Secondary | ICD-10-CM | POA: Diagnosis not present

## 2014-02-25 DIAGNOSIS — Z862 Personal history of diseases of the blood and blood-forming organs and certain disorders involving the immune mechanism: Secondary | ICD-10-CM | POA: Insufficient documentation

## 2014-02-25 DIAGNOSIS — Z880 Allergy status to penicillin: Secondary | ICD-10-CM | POA: Insufficient documentation

## 2014-02-25 DIAGNOSIS — I10 Essential (primary) hypertension: Secondary | ICD-10-CM | POA: Insufficient documentation

## 2014-02-25 DIAGNOSIS — Z79899 Other long term (current) drug therapy: Secondary | ICD-10-CM | POA: Insufficient documentation

## 2014-02-25 DIAGNOSIS — Z8781 Personal history of (healed) traumatic fracture: Secondary | ICD-10-CM | POA: Diagnosis not present

## 2014-02-25 DIAGNOSIS — Y92410 Unspecified street and highway as the place of occurrence of the external cause: Secondary | ICD-10-CM | POA: Diagnosis not present

## 2014-02-25 MED ORDER — NAPROXEN 500 MG PO TABS: 500.0000 mg | ORAL_TABLET | Freq: Two times a day (BID) | ORAL | Status: DC

## 2014-02-25 MED ORDER — NAPROXEN 250 MG PO TABS
500.0000 mg | ORAL_TABLET | Freq: Once | ORAL | Status: AC
  Administered 2014-02-25: 500 mg via ORAL
  Filled 2014-02-25: qty 2

## 2014-02-25 NOTE — Discharge Instructions (Signed)
Please call your doctor for a followup appointment within 24-48 hours. When you talk to your doctor please let them know that you were seen in the emergency department and have them acquire all of your records so that they can discuss the findings with you and formulate a treatment plan to fully care for your new and ongoing problems.  Use Aleve as needed twice a day, prescription is attached, see her doctor for ongoing pain. Return for severe swelling numbness or weakness.
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
## 2014-05-07 ENCOUNTER — Other Ambulatory Visit: Payer: Self-pay | Admitting: Internal Medicine

## 2014-07-17 ENCOUNTER — Ambulatory Visit: Payer: Self-pay | Attending: Internal Medicine | Admitting: Internal Medicine

## 2014-07-17 VITALS — BP 124/85 | HR 89 | Temp 98.4°F | Resp 16 | Wt 203.6 lb

## 2014-07-17 DIAGNOSIS — Z0000 Encounter for general adult medical examination without abnormal findings: Secondary | ICD-10-CM

## 2014-07-17 LAB — POCT URINALYSIS DIPSTICK
Bilirubin, UA: NEGATIVE
GLUCOSE UA: NEGATIVE
Ketones, UA: NEGATIVE
Nitrite, UA: NEGATIVE
PH UA: 6.5
PROTEIN UA: NEGATIVE
Spec Grav, UA: 1.01
Urobilinogen, UA: 0.2

## 2014-07-17 NOTE — Patient Instructions (Addendum)
Please call Rolena Infante, [phone],  with the BCCCP (breast and cervical cancer control program) at the [facility] to set up an appointment to verify eligibility for a breast exam, mammogram, ultrasound. If you qualify this will be set up at [place] stands for "Dietary Approaches to Stop Hypertension." The DASH eating plan is a healthy eating plan that has been shown to reduce high blood pressure (hypertension). Additional health benefits may include reducing the risk of type 2 diabetes mellitus, heart disease, and stroke. The DASH eating plan may also help with weight loss. WHAT DO I NEED TO KNOW ABOUT THE DASH EATING PLAN? For the DASH eating plan, you will follow these general guidelines:  Choose foods with a percent daily value for sodium of less than 5% (as listed on the food label).  Use salt-free seasonings or herbs instead of table salt or sea salt.  Check with your health care provider or pharmacist before using salt substitutes.  Eat lower-sodium products, often labeled as "lower sodium" or "no salt added."  Eat fresh foods.  Eat more vegetables, fruits, and low-fat dairy products.  Choose whole grains. Look for the word "whole" as the first word in the ingredient list.  Choose fish and skinless chicken or [place] more often than red meat. Limit fish, poultry, and meat to 6 oz (170 g) each day.  Limit sweets, desserts, sugars, and sugary drinks.  Choose heart-healthy fats.  Limit cheese to 1 oz (28 g) per day.  Eat more home-cooked food and less restaurant, buffet, and fast food.  Limit fried foods.  Cook foods using methods other than frying.  Limit canned vegetables. If you do use them, rinse them well to decrease the sodium.  When eating at a restaurant, ask that your food be prepared with less salt, or no salt if possible. WHAT FOODS CAN I EAT? Seek help from a dietitian for individual calorie needs. Grains Whole grain or  whole wheat bread. Brown rice. Whole grain or whole wheat pasta. Quinoa, bulgur, and whole grain cereals. Low-sodium cereals. Corn or whole wheat flour tortillas. Whole grain cornbread. Whole grain crackers. Low-sodium crackers. Vegetables Fresh or frozen vegetables (raw, steamed, roasted, or grilled). Low-sodium or reduced-sodium tomato and vegetable juices. Low-sodium or reduced-sodium tomato sauce and paste. Low-sodium or reduced-sodium canned vegetables.  Fruits All fresh, canned (in natural juice), or frozen fruits. Meat and Other Protein Products Ground beef (85% or leaner), grass-fed beef, or beef trimmed of fat. Skinless chicken or [place]. Ground chicken or [place]. Pork trimmed of fat. All fish and seafood. Eggs. Dried beans, peas, or lentils. Unsalted nuts and seeds. Unsalted canned beans. Dairy Low-fat dairy products, such as skim or 1% milk, 2% or reduced-fat cheeses, low-fat ricotta or cottage cheese, or plain low-fat yogurt. Low-sodium or reduced-sodium cheeses. Fats and Oils Tub margarines without trans fats. Light or reduced-fat mayonnaise and salad dressings (reduced sodium). Avocado. Safflower, olive, or canola oils. Natural peanut or almond butter. Other Unsalted popcorn and pretzels. The items listed above may not be a complete list of recommended foods or beverages. Contact your dietitian for more options. WHAT FOODS ARE NOT RECOMMENDED? Grains White bread. White pasta. White rice. Refined cornbread. Bagels and croissants. Crackers that contain trans fat. Vegetables Creamed or fried vegetables. Vegetables in a cheese sauce. Regular canned vegetables. Regular canned tomato sauce and paste. Regular tomato and vegetable juices. Fruits Dried fruits. Canned fruit in light or heavy syrup. Fruit juice. Meat and  Other Protein Products Fatty cuts of meat. Ribs, chicken wings, bacon, sausage, bologna, salami, chitterlings, fatback, hot dogs, bratwurst, and packaged luncheon meats.  Salted nuts and seeds. Canned beans with salt. Dairy Whole or 2% milk, cream, half-and-half, and cream cheese. Whole-fat or sweetened yogurt. Full-fat cheeses or blue cheese. Nondairy creamers and whipped toppings. Processed cheese, cheese spreads, or cheese curds. Condiments Onion and garlic salt, seasoned salt, table salt, and sea salt. Canned and packaged gravies. Worcestershire sauce. Tartar sauce. Barbecue sauce. Teriyaki sauce. Soy sauce, including reduced sodium. Steak sauce. Fish sauce. Oyster sauce. Cocktail sauce. Horseradish. Ketchup and mustard. Meat flavorings and tenderizers. Bouillon cubes. Hot sauce. Tabasco sauce. Marinades. Taco seasonings. Relishes. Fats and Oils Butter, stick margarine, lard, shortening, ghee, and bacon fat. Coconut, palm kernel, or palm oils. Regular salad dressings. Other Pickles and olives. Salted popcorn and pretzels. The items listed above may not be a complete list of foods and beverages to avoid. Contact your dietitian for more information. WHERE CAN I FIND MORE INFORMATION? National Heart, Lung, and Blood Institute: travelstabloid.com Document Released: 01/20/2011 Document Revised: 06/17/2013 Document Reviewed: 12/05/2012 [facility] Patient Information 2015 [place], [place]. This information is not intended to replace advice given to you by your health care provider. Make sure you discuss any questions you have with your health care provider.

## 2014-07-17 NOTE — Progress Notes (Signed)
Here for physical  No complaints but does want blood work

## 2014-07-17 NOTE — Progress Notes (Signed)
Patient ID: Stacy Barrett, female   DOB: [birth year], 49 y.o.   MRN: [id number]  CC: physical exam  HPI: Stacy Barrett is a 49 y.o. female here today for a physical exam.  Patient has past medical history of HTN, pituitary adenoma, depression, and anxiety.  Her last pap smear was last year which was normal. She is currently being seen by [facility] for depression. She reports that she was placed on Latuda in November and began to have breast enlargement, 30 pound weight gain, and hyperprolactinemia. She stopped taking Latuda about 4 months ago due to the side effects.   She has not had a mammogram in several years ago which was normal. She notes that her cycles are unpredictable. She gets cycles for one day and then she may skip a month. She is concerned about menopause. Some hot flashes and mood changes.   Patient has No headache, No chest pain, No abdominal pain - No Nausea, No new weakness tingling or numbness, No Cough - SOB.  Allergies  Allergen Reactions  . Other Other (See Comments)    MSG -Very intense headaches  . Penicillins Hives  . Prednisone Itching, Swelling and Rash    Swelling of lips and face  . Sulfa Antibiotics Hives and Itching   Past Medical History  Diagnosis Date  . Hypertension   . Lumbar radiculopathy   . Vertebral fracture   . Pituitary adenoma   . Edema   . Depression   . Anxiety    Current Outpatient Prescriptions on File Prior to Visit  Medication Sig Dispense Refill  . clonazePAM (KLONOPIN) 1 MG tablet Take 1 tablet (1 mg total) by mouth 2 (two) times daily. For anxiety 20 tablet 0  . hydrochlorothiazide (HYDRODIURIL) 25 MG tablet TAKE 1 TABLET BY MOUTH DAILY FOR HIGH BLOOD PRESSURE 30 tablet 3  . mirtazapine (REMERON) 7.5 MG tablet Take 1 tablet (7.5 mg total) by mouth at bedtime. For sleep 30 tablet 0  . sertraline (ZOLOFT) 100 MG tablet Take 2 tablets (200 mg total) by mouth daily. For depression 60 tablet 0   No current facility-administered  medications on file prior to visit.   No family history on file. History   Social History  . Marital Status: Unknown    Spouse Name: N/A  . Number of Children: N/A  . Years of Education: N/A   Occupational History  . Not on file.   Social History Main Topics  . Smoking status: Current Every Day Smoker  . Smokeless tobacco: Not on file  . Alcohol Use: Yes  . Drug Use: Yes    Special: Marijuana  . Sexual Activity: Not on file   Other Topics Concern  . Not on file   Social History Narrative    Review of Systems: Constitutional: Negative for fever, chills, diaphoresis, activity change, appetite change and fatigue. HENT: Negative for ear pain, nosebleeds, congestion, facial swelling, rhinorrhea, neck pain, neck stiffness and ear discharge.  Eyes: Negative for pain, discharge, redness, itching and visual disturbance. Respiratory: Negative for cough, choking, chest tightness, shortness of breath, wheezing and stridor.  Cardiovascular: Negative for chest pain, palpitations and leg swelling. Gastrointestinal: Negative for abdominal distention. Genitourinary: Negative for dysuria, urgency, frequency, hematuria, flank pain, decreased urine volume, difficulty urinating and dyspareunia.  Musculoskeletal: Negative for back pain, joint swelling, arthralgias and gait problem. Neurological: Negative for dizziness, tremors, seizures, syncope, facial asymmetry, speech difficulty, weakness, light-headedness, numbness and headaches.  Hematological: Negative for adenopathy. Does not bruise/bleed easily.  Psychiatric/Behavioral: Negative for hallucinations, behavioral problems, confusion, dysphoric mood, decreased concentration and agitation. +depression   Objective:   Filed Vitals:   07/17/14 1156  BP: 124/85  Pulse: 89  Temp: 98.4 F (36.9 C)  Resp: 16    Physical Exam: Constitutional: Patient appears well-developed and well-nourished. No distress. HENT: Normocephalic, atraumatic,  External right and left ear normal. Oropharynx is clear and moist.  Eyes: Conjunctivae and EOM are normal. PERRLA, no scleral icterus. Neck: Normal ROM. Neck supple. No JVD. No tracheal deviation. No thyromegaly. CVS: RRR, S1/S2 +, no murmurs, no gallops, no carotid bruit.  Pulmonary: Effort and breath sounds normal, no stridor, rhonchi, wheezes, rales.  Abdominal: Soft. BS +,  no distension, tenderness, rebound or guarding.  Musculoskeletal: Normal range of motion. No edema and no tenderness.  Lymphadenopathy: No lymphadenopathy noted, cervical, inguinal or axillary Neuro: Alert. Normal reflexes, muscle tone coordination. No cranial nerve deficit. Skin: Skin is warm and dry. No rash noted. Not diaphoretic. No erythema. No pallor. Psychiatric: Normal mood and affect. Behavior, judgment, thought content normal. Breasts: breasts appear normal, no suspicious masses, no skin or nipple changes or axillary nodes   Lab Results  Component Value Date   WBC 10.0 10/03/2013   HGB 15.0 10/03/2013   HCT 43.4 10/03/2013   MCV 86.3 10/03/2013   PLT 323 10/03/2013   Lab Results  Component Value Date   CREATININE 0.65 10/03/2013   BUN 10 10/03/2013   NA 138 10/03/2013   K 3.5* 10/03/2013   CL 102 10/03/2013   CO2 25 10/03/2013    Lab Results  Component Value Date   HGBA1C 5.7* 10/08/2013   Lipid Panel  No results found for: CHOL, TRIG, HDL, CHOLHDL, VLDL, LDLCALC     Assessment and plan:   Diagnoses and all orders for this visit:  Physical exam, annual Orders: -     POCT urinalysis dipstick -     MM Digital Screening; Future -     Lipid panel; Future -     CBC; Future -     COMPLETE METABOLIC PANEL WITH GFR; Future -     Prolactin; Future  Return in about 6 months (around 01/16/2015) for Hypertension and tomorrow Lab visit.        Chari Manning, NP-C [facility] [phone] 07/17/2014, 12:13 PM

## 2014-07-18 ENCOUNTER — Ambulatory Visit: Payer: No Typology Code available for payment source | Attending: Internal Medicine

## 2014-07-18 DIAGNOSIS — Z0000 Encounter for general adult medical examination without abnormal findings: Secondary | ICD-10-CM

## 2014-07-18 LAB — COMPLETE METABOLIC PANEL WITH GFR
ALT: 17 U/L (ref 0–35)
AST: 16 U/L (ref 0–37)
Albumin: 4 g/dL (ref 3.5–5.2)
Alkaline Phosphatase: 69 U/L (ref 39–117)
BILIRUBIN TOTAL: 0.4 mg/dL (ref 0.2–1.2)
BUN: 19 mg/dL (ref 6–23)
CALCIUM: 10 mg/dL (ref 8.4–10.5)
CHLORIDE: 105 meq/L (ref 96–112)
CO2: 29 meq/L (ref 19–32)
CREATININE: 0.65 mg/dL (ref 0.50–1.10)
GFR, Est African American: >89 mL/min
Glucose, Bld: 87 mg/dL (ref 70–99)
Potassium: 4.6 mEq/L (ref 3.5–5.3)
SODIUM: 142 meq/L (ref 135–145)
Total Protein: 6.5 g/dL (ref 6.0–8.3)

## 2014-07-18 LAB — LIPID PANEL
Cholesterol: 259 mg/dL — ABNORMAL HIGH (ref 0–200)
HDL: 29 mg/dL — ABNORMAL LOW (ref >=46)
Total CHOL/HDL Ratio: 8.9 Ratio
Triglycerides: 470 mg/dL — ABNORMAL HIGH (ref <150)

## 2014-07-18 LAB — CBC
HEMATOCRIT: 44.5 % (ref 36.0–46.0)
Hemoglobin: 15.1 g/dL — ABNORMAL HIGH (ref 12.0–15.0)
MCH: 29.2 pg (ref 26.0–34.0)
MCHC: 33.9 g/dL (ref 30.0–36.0)
MCV: 86.1 fL (ref 78.0–100.0)
MPV: 10.2 fL (ref 8.6–12.4)
PLATELETS: 269 10*3/uL (ref 150–400)
RBC: 5.17 MIL/uL — ABNORMAL HIGH (ref 3.87–5.11)
RDW: 13.4 % (ref 11.5–15.5)
WBC: 8.8 10*3/uL (ref 4.0–10.5)

## 2014-07-19 ENCOUNTER — Encounter: Payer: Self-pay | Admitting: Internal Medicine

## 2014-07-19 LAB — PROLACTIN: Prolactin: 36.8 ng/mL

## 2014-07-25 ENCOUNTER — Ambulatory Visit: Payer: Self-pay | Attending: Internal Medicine

## 2014-08-11 ENCOUNTER — Telehealth: Payer: Self-pay

## 2014-08-11 DIAGNOSIS — I10 Essential (primary) hypertension: Secondary | ICD-10-CM

## 2014-08-11 DIAGNOSIS — E785 Hyperlipidemia, unspecified: Secondary | ICD-10-CM

## 2014-08-11 MED ORDER — HYDROCHLOROTHIAZIDE 25 MG PO TABS: 25.0000 mg | ORAL_TABLET | Freq: Every day | ORAL | Status: DC

## 2014-08-11 MED ORDER — OMEGA-3-ACID ETHYL ESTERS 1 G PO CAPS: 2.0000 g | ORAL_CAPSULE | Freq: Two times a day (BID) | ORAL | Status: DC

## 2014-08-11 NOTE — Telephone Encounter (Signed)
-----   Message from Lance Bosch, NP sent at 07/25/2014 11:19 AM EDT ----- Everything normal except cholesterol and triglycerides are really elevated. I want her to start taking Lovaza 2g BID before meals. This will help with her triglycerides. Please go over diet changes and exercise with this patient in detail. This high level could also be a result of psych medications such as the remeron. I will continue to monitor over time.

## 2014-08-11 NOTE — Telephone Encounter (Signed)
Nurse called patient, patient verified date of birth. Patient aware of normal labs except very high cholesterol and triglycerides. Patient will pick up Lovaza from pharmacy at [facility]. Patient reports being out of HCTZ also, nurse will send medication to pharmacy.  Patient aware of low fat, low cholesterol diet, add exercise as tolerated and limit butter and oils.  Patient voices understanding and has no further questions at this time.

## 2014-12-11 ENCOUNTER — Other Ambulatory Visit: Payer: Self-pay | Admitting: Internal Medicine

## 2014-12-18 ENCOUNTER — Emergency Department (HOSPITAL_COMMUNITY): Payer: Self-pay

## 2014-12-18 ENCOUNTER — Telehealth: Payer: Self-pay

## 2014-12-18 ENCOUNTER — Encounter (HOSPITAL_COMMUNITY): Payer: Self-pay | Admitting: *Deleted

## 2014-12-18 ENCOUNTER — Emergency Department (HOSPITAL_COMMUNITY)
Admission: EM | Admit: 2014-12-18 | Discharge: 2014-12-19 | Disposition: A | Discharge status: Home / Self Care | Payer: Self-pay | Attending: Emergency Medicine | Admitting: Emergency Medicine

## 2014-12-18 DIAGNOSIS — R61 Generalized hyperhidrosis: Secondary | ICD-10-CM | POA: Insufficient documentation

## 2014-12-18 DIAGNOSIS — J189 Pneumonia, unspecified organism: Secondary | ICD-10-CM

## 2014-12-18 DIAGNOSIS — J159 Unspecified bacterial pneumonia: Secondary | ICD-10-CM | POA: Insufficient documentation

## 2014-12-18 DIAGNOSIS — R200 Anesthesia of skin: Secondary | ICD-10-CM | POA: Insufficient documentation

## 2014-12-18 DIAGNOSIS — F419 Anxiety disorder, unspecified: Secondary | ICD-10-CM | POA: Insufficient documentation

## 2014-12-18 DIAGNOSIS — Z8739 Personal history of other diseases of the musculoskeletal system and connective tissue: Secondary | ICD-10-CM | POA: Insufficient documentation

## 2014-12-18 DIAGNOSIS — F329 Major depressive disorder, single episode, unspecified: Secondary | ICD-10-CM | POA: Insufficient documentation

## 2014-12-18 DIAGNOSIS — Z880 Allergy status to penicillin: Secondary | ICD-10-CM | POA: Insufficient documentation

## 2014-12-18 DIAGNOSIS — Z720 Tobacco use: Secondary | ICD-10-CM | POA: Insufficient documentation

## 2014-12-18 DIAGNOSIS — Z85858 Personal history of malignant neoplasm of other endocrine glands: Secondary | ICD-10-CM | POA: Insufficient documentation

## 2014-12-18 DIAGNOSIS — Z79899 Other long term (current) drug therapy: Secondary | ICD-10-CM | POA: Insufficient documentation

## 2014-12-18 DIAGNOSIS — R110 Nausea: Secondary | ICD-10-CM | POA: Insufficient documentation

## 2014-12-18 DIAGNOSIS — I10 Essential (primary) hypertension: Secondary | ICD-10-CM | POA: Insufficient documentation

## 2014-12-18 LAB — BASIC METABOLIC PANEL
ANION GAP: 14 (ref 5–15)
BUN: 13 mg/dL (ref 6–20)
CALCIUM: 10.7 mg/dL — AB (ref 8.9–10.3)
CO2: 21 mmol/L — ABNORMAL LOW (ref 22–32)
Chloride: 100 mmol/L — ABNORMAL LOW (ref 101–111)
Creatinine, Ser: 0.71 mg/dL (ref 0.44–1.00)
GLUCOSE: 88 mg/dL (ref 65–99)
Potassium: 3.3 mmol/L — ABNORMAL LOW (ref 3.5–5.1)
SODIUM: 135 mmol/L (ref 135–145)

## 2014-12-18 LAB — I-STAT TROPONIN, ED: TROPONIN I, POC: 0 ng/mL (ref 0.00–0.08)

## 2014-12-18 LAB — CBC
HCT: 43.2 % (ref 36.0–46.0)
HEMOGLOBIN: 15.4 g/dL — AB (ref 12.0–15.0)
MCH: 29.7 pg (ref 26.0–34.0)
MCHC: 35.6 g/dL (ref 30.0–36.0)
MCV: 83.4 fL (ref 78.0–100.0)
Platelets: 248 10*3/uL (ref 150–400)
RBC: 5.18 MIL/uL — ABNORMAL HIGH (ref 3.87–5.11)
RDW: 12.8 % (ref 11.5–15.5)
WBC: 9.3 10*3/uL (ref 4.0–10.5)

## 2014-12-18 MED ORDER — SODIUM CHLORIDE 0.9 % IV BOLUS (SEPSIS)
1000.0000 mL | Freq: Once | INTRAVENOUS | Status: AC
  Administered 2014-12-19: 1000 mL via INTRAVENOUS

## 2014-12-18 MED ORDER — IOHEXOL 350 MG/ML SOLN
100.0000 mL | Freq: Once | INTRAVENOUS | Status: AC | PRN
  Administered 2014-12-18: 100 mL via INTRAVENOUS

## 2014-12-18 NOTE — ED Provider Notes (Addendum)
CSN: [id number]   Arrival date & time 12/18/14 1823  History  By signing my name below, I, Altamease Oiler, attest that this documentation has been prepared under the direction and in the presence of Everlene Balls, MD. Electronically Signed: Altamease Oiler, ED Scribe. 12/18/2014. 11:40 PM.  Chief Complaint  Patient presents with  . Chest Pain  . Shortness of Breath    HPI The history is provided by the patient. No language interpreter was used.   Stacy Barrett is a 49 y.o. female with HTN who presents to the Emergency Department complaining of new and intermittent SOB with gradual onset 1 week ago. The sensation is worse with exertion and at night with laying flat. Associated symptoms include chest tightness, fatigue, nausea, sweating, and intermittent numbness in the left arm. She notes being under a lot of stress due to caring for her mentally ill mother. Pt denies recent LE swelling or pain. No history of DVT/PE.  No recent long travel by car or plane, surgery or hospitalization, bone fracture, or hormonal therapy. Her primary care is at [facility].     Past Medical History  Diagnosis Date  . Hypertension   . Lumbar radiculopathy   . Vertebral fracture   . Pituitary adenoma ([place])   . Edema   . Depression   . Anxiety     History reviewed. No pertinent past surgical history.  History reviewed. No pertinent family history.  Social History  Substance Use Topics  . Smoking status: Current Every Day Smoker  . Smokeless tobacco: None  . Alcohol Use: Yes     Review of Systems  Constitutional: Positive for diaphoresis. Negative for fever and chills.  Respiratory: Positive for chest tightness and shortness of breath. Negative for cough.   Cardiovascular: Negative for leg swelling.  Gastrointestinal: Positive for nausea. Negative for vomiting.  Neurological: Positive for numbness.  10 Systems reviewed and all are negative for acute change except as  noted in the HPI. Home Medications   Prior to Admission medications   Medication Sig Start Date End Date Taking? Authorizing Provider  clonazePAM (KLONOPIN) 1 MG tablet Take 1 tablet (1 mg total) by mouth 2 (two) times daily. For anxiety 10/15/13   Encarnacion Slates, NP  gabapentin (NEURONTIN) 100 MG capsule Take 100 mg by mouth at bedtime.    Historical Provider, MD  hydrochlorothiazide (HYDRODIURIL) 25 MG tablet Take 1 tablet (25 mg total) by mouth daily. for high blood pressure 08/11/14   Lance Bosch, NP  mirtazapine (REMERON) 7.5 MG tablet Take 1 tablet (7.5 mg total) by mouth at bedtime. For sleep 12/24/13   Lance Bosch, NP  omega-3 acid ethyl esters (LOVAZA) 1 G capsule Take 2 capsules (2 g total) by mouth 2 (two) times daily. Take 2 times daily with meals. 08/11/14   Lance Bosch, NP  sertraline (ZOLOFT) 100 MG tablet Take 2 tablets (200 mg total) by mouth daily. For depression 12/24/13   Lance Bosch, NP    Allergies  Other; Penicillins; Prednisone; and Sulfa antibiotics  Triage Vitals: BP 137/89 mmHg  Pulse 83  Temp(Src) 99.6 F (37.6 C) (Oral)  Resp 20  SpO2 95%  LMP 12/03/2014  Physical Exam  Constitutional: She is oriented to person, place, and time. She appears well-developed and well-nourished. No distress.  Pt appears anxious and stressed out.  HENT:  Head: Normocephalic and atraumatic.  Nose: Nose normal.  Mouth/Throat: Oropharynx is clear and moist. No  oropharyngeal exudate.  Eyes: Conjunctivae and EOM are normal. Pupils are equal, round, and reactive to light. No scleral icterus.  Neck: Normal range of motion. Neck supple. No JVD present. No tracheal deviation present. No thyromegaly present.  Cardiovascular: Normal rate, regular rhythm and normal heart sounds.  Exam reveals no gallop and no friction rub.   No murmur heard. Pulmonary/Chest: Effort normal and breath sounds normal. No respiratory distress. She has no wheezes. She exhibits no tenderness.   Abdominal: Soft. Bowel sounds are normal. She exhibits no distension and no mass. There is no tenderness. There is no rebound and no guarding.  Musculoskeletal: Normal range of motion. She exhibits no edema or tenderness.  Lymphadenopathy:    She has no cervical adenopathy.  Neurological: She is alert and oriented to person, place, and time. No cranial nerve deficit. She exhibits normal muscle tone.  Skin: Skin is warm and dry. No rash noted. No erythema. No pallor.  Nursing note and vitals reviewed.   ED Course  Procedures   DIAGNOSTIC STUDIES: Oxygen Saturation is 95% on RA, normal by my interpretation.    COORDINATION OF CARE: 11:21 PM Discussed treatment plan which includes lab work, CXR, EKG, CT chest with pt at bedside and pt agreed to plan.  Labs Reviewed  BASIC METABOLIC PANEL - Abnormal; Notable for the following:    Potassium 3.3 (*)    Chloride 100 (*)    CO2 21 (*)    Calcium 10.7 (*)    All other components within normal limits  CBC - Abnormal; Notable for the following:    RBC 5.18 (*)    Hemoglobin 15.4 (*)    All other components within normal limits  TROPONIN I  TSH  T4, FREE  I-STAT TROPOININ, ED    Imaging Review Dg Chest 2 View  12/18/2014  CLINICAL DATA:  49 year old female with shortness of breath and chest pain for the past week EXAM: CHEST  2 VIEW COMPARISON:  None. FINDINGS: The lungs are clear and negative for focal airspace consolidation, pulmonary edema or suspicious pulmonary nodule. A subtle linear opacity in the right upper lung may represent a region of atelectasis or pleural parenchymal scarring. No pleural effusion or pneumothorax. Cardiac and mediastinal contours are within normal limits. No acute fracture or lytic or blastic osseous lesions. The visualized upper abdominal bowel gas pattern is unremarkable. IMPRESSION: No active cardiopulmonary disease. Focal atelectasis or pleural-parenchymal scarring in the right upper lung. Electronically  Signed   By: Jacqulynn Cadet M.D.   On: 12/18/2014 19:41   Ct Angio Chest Pe W/cm &/or Wo Cm  12/19/2014  CLINICAL DATA:  Mid chest tightness and shortness of breath. Symptoms for 1 week. Symptoms worse with exertion. EXAM: CT ANGIOGRAPHY CHEST WITH CONTRAST TECHNIQUE: Multidetector CT imaging of the chest was performed using the standard protocol during bolus administration of intravenous contrast. Multiplanar CT image reconstructions and MIPs were obtained to evaluate the vascular anatomy. CONTRAST:  187mL OMNIPAQUE IOHEXOL 350 MG/ML SOLN COMPARISON:  None. FINDINGS: Technically adequate study with moderately good opacification of the central and proximal segmental pulmonary arteries. No discrete filling defects demonstrated, suggesting no evidence of significant central pulmonary embolus. More peripheral vessels are poorly visualized. Normal heart size. Normal caliber thoracic aorta. Esophagus is decompressed. No significant lymphadenopathy in the chest. Evaluation of lungs is limited due to respiratory motion artifact. There is dependent atelectasis in the posterior lungs with linear atelectasis or focal consolidation in the right upper lung. Scattered emphysematous changes  in the lungs. No pleural effusions. No pneumothorax. Included portions of the upper abdominal organs are grossly unremarkable. Mild degenerative changes in the spine. Focal sclerosis demonstrated in the T4 vertebra probably benign. No destructive bone lesions. Review of the MIP images confirms the above findings. IMPRESSION: No evidence of significant central pulmonary embolus. Linear atelectasis or consolidation in the right upper lung. Dependent changes in the posterior lungs. Scattered emphysematous changes. Electronically Signed   By: Lucienne Capers M.D.   On: 12/19/2014 00:21    I personally reviewed and evaluated these images and lab results as a part of my medical decision-making.   EKG  Interpretation  Date/Time:  Thursday December 18 2014 18:41:40 EDT Ventricular Rate:  93 PR Interval:  138 QRS Duration: 64 QT Interval:  350 QTC Calculation: 435 R Axis:   26 Text Interpretation:  Normal sinus rhythm No old tracing to compare Confirmed by Glynn Octave [phone]) on 12/18/2014 11:36:54 PM    MDM   Final diagnoses:  None   Patient presents to the ED for chest pain and generalized weakness. I believe a lot of this may be related to stress as the patient has a mother who is ill. I obtain CT scan of the chest for pleuritic chest pain. This reveals possible consolidation in the right upper lung. Will treat for pneumonia. Patient was advised to see primary care physician for close follow-up within 3 days. Patient will be placed on 5 day course of azithromycin. She appears well in no acute distress, vital signs were within her normal limits and she is safe for discharge.  Repeat trop/EKG are normal.  HEART score less than 3.     I personally performed the services described in this documentation, which was scribed in my presence. The recorded information has been reviewed and is accurate.      Everlene Balls, MD 12/19/14 [id number]  Everlene Balls, MD 12/19/14 [phone]

## 2014-12-18 NOTE — Telephone Encounter (Signed)
Patient reports feeling clammy, shortness of breath, fatigue, achy joints and chest tightness. Patients mother upset patient last night and patient experienced numbness down left arm. Patient reports her mother is not mentally stable, has dementia.  Patient was walking dog 1 week ago and "belly-flopped". This happened about 3 days before symptoms started.  Nurse advised patient to go to emergency room. Patient explains she can drive herself to emergency room. Nurse advised patient not to drive herself. Patient explains she will have someone take her to emergency room.  Patient explains she is scared because of her symptoms. Nurse advised patient to call 911 to go to emergency room.  Patient voices understanding.

## 2014-12-18 NOTE — ED Notes (Signed)
Patient transported to CT 

## 2014-12-18 NOTE — ED Notes (Signed)
Patient reports midsternal chest pain with sob for approx a week, chest pain usually with exertion. Patient states last night after getting upset her left arm went numb.

## 2014-12-19 LAB — T4, FREE: Free T4: 0.91 ng/dL (ref 0.61–1.12)

## 2014-12-19 LAB — TSH: TSH: 3.023 u[IU]/mL (ref 0.350–4.500)

## 2014-12-19 LAB — TROPONIN I: Troponin I: <0.03 ng/mL (ref <0.031)

## 2014-12-19 MED ORDER — AZITHROMYCIN 250 MG PO TABS: 250.0000 mg | ORAL_TABLET | Freq: Every day | ORAL | Status: DC

## 2014-12-19 MED ORDER — AZITHROMYCIN 250 MG PO TABS: 500.0000 mg | ORAL_TABLET | Freq: Once | ORAL | Status: DC

## 2014-12-19 NOTE — Discharge Instructions (Signed)
Community-Acquired Pneumonia, Adult Stacy Barrett, your CT scan results are below and show a possible pneumonia. Take azithromycin for 5 days for treatment. See a primary care physician for close follow-up of your chest pain. If any symptoms worsen come back to the emergency department and medially. Thank you.    IMPRESSION: No evidence of significant central pulmonary embolus. Linear atelectasis or consolidation in the right upper lung. Dependent changes in the posterior lungs. Scattered emphysematous changes.  Pneumonia is an infection of the lungs. One type of pneumonia can happen while a person is in a hospital. A different type can happen when a person is not in a hospital (community-acquired pneumonia). It is easy for this kind to spread from person to person. It can spread to you if you breathe near an infected person who coughs or sneezes. Some symptoms include:  A dry cough.  A wet (productive) cough.  Fever.  Sweating.  Chest pain. HOME CARE  Take over-the-counter and prescription medicines only as told by your doctor.  Only take cough medicine if you are losing sleep.  If you were prescribed an antibiotic medicine, take it as told by your doctor. Do not stop taking the antibiotic even if you start to feel better.  Sleep with your head and neck raised (elevated). You can do this by putting a few pillows under your head, or you can sleep in a recliner.  Do not use tobacco products. These include cigarettes, chewing tobacco, and e-cigarettes. If you need help quitting, ask your doctor.  Drink enough water to keep your pee (urine) clear or pale yellow. A shot (vaccine) can help prevent pneumonia. Shots are often suggested for:  People older than 50 years of age.  People older than 50 years of age:  Who are having cancer treatment.  Who have long-term (chronic) lung disease.  Who have problems with their body's defense system (immune system). You may also prevent  pneumonia if you take these actions:  Get the flu (influenza) shot every year.  Go to the dentist as often as told.  Wash your hands often. If soap and water are not available, use hand sanitizer. GET HELP IF:  You have a fever.  You lose sleep because your cough medicine does not help. GET HELP RIGHT AWAY IF:  You are short of breath and it gets worse.  You have more chest pain.  Your sickness gets worse. This is very serious if:  You are an older adult.  Your body's defense system is weak.  You cough up blood.   This information is not intended to replace advice given to you by your health care provider. Make sure you discuss any questions you have with your health care provider.   Document Released: 07/20/2007 Document Revised: 10/22/2014 Document Reviewed: 05/28/2014 Elsevier Interactive Patient Education [employer].

## 2015-03-20 MED FILL — HYDROCHLOROTHIAZIDE 25 MG T: 25 | 30 days supply | Qty: 30 | Fill #2

## 2015-03-30 ENCOUNTER — Encounter: Payer: Self-pay | Admitting: Internal Medicine

## 2015-03-30 ENCOUNTER — Ambulatory Visit: Payer: No Typology Code available for payment source | Attending: Internal Medicine | Admitting: Internal Medicine

## 2015-03-30 VITALS — BP 140/85 | HR 85 | Temp 98.0°F | Resp 16 | Wt 202.0 lb

## 2015-03-30 DIAGNOSIS — J069 Acute upper respiratory infection, unspecified: Secondary | ICD-10-CM

## 2015-03-30 DIAGNOSIS — R05 Cough: Secondary | ICD-10-CM | POA: Insufficient documentation

## 2015-03-30 LAB — POCT RAPID STREP A (OFFICE): RAPID STREP A SCREEN: NEGATIVE

## 2015-03-30 MED ORDER — CLINDAMYCIN HCL 300 MG PO CAPS: 300.0000 mg | ORAL_CAPSULE | Freq: Two times a day (BID) | ORAL | Status: DC

## 2015-03-30 MED ORDER — CLARITHROMYCIN 250 MG PO TABS: 250.0000 mg | ORAL_TABLET | Freq: Two times a day (BID) | ORAL | Status: DC

## 2015-03-30 MED FILL — CLINDAMYCIN HCL 300 MG CAP: 300 | 7 days supply | Qty: 14 | Fill #0

## 2015-03-30 NOTE — Progress Notes (Signed)
   Subjective:    Patient ID: Stacy Barrett, female    DOB: Nov 14, 1965, 50 y.o.   MRN: [id number]  HPI Comments: Was on doxycycline for 7 days last month.   Cough The current episode started 1 to 4 weeks ago. Associated symptoms include chills, ear pain, nasal congestion, postnasal drip and rhinorrhea. Associated symptoms comments: Fatigue, swollen glands. Nothing aggravates the symptoms. She has tried OTC cough suppressant for the symptoms. Her past medical history is significant for bronchitis and pneumonia.   Review of Systems  Constitutional: Positive for chills.  HENT: Positive for ear pain, postnasal drip and rhinorrhea.   Respiratory: Positive for cough.   All other systems reviewed and are negative.      Objective:   Physical Exam  HENT:  Right Ear: External ear normal.  Left Ear: External ear normal.  Erythematous ear canal, bilaterally  Eyes: Pupils are equal, round, and reactive to light. Right eye exhibits no discharge. Left eye exhibits no discharge.  Cardiovascular: Normal rate and regular rhythm.   Pulmonary/Chest: Effort normal and breath sounds normal. She has no wheezes.  Lymphadenopathy:    She has cervical adenopathy.      Assessment & Plan:  Stacy Barrett was seen today for cough.  Diagnoses and all orders for this visit:  URI (upper respiratory infection) -     Rapid Strep A -     Culture, Group A Strep -     clindamycin (CLEOCIN) 300 MG capsule; Take 1 capsule (300 mg total) by mouth 2 (two) times daily.  Return if symptoms worsen or fail to improve.   Lance Bosch, NP 03/30/2015 5:06 PM

## 2015-03-30 NOTE — Progress Notes (Signed)
Patient complains of cough thick green mucous Feels fatigued Also complains of a swollen gland to her neck Patient has already done a round of antibiotics she received from urgent care But still not feeling herself

## 2015-04-01 ENCOUNTER — Telehealth: Payer: Self-pay

## 2015-04-01 LAB — CULTURE, GROUP A STREP: Organism ID, Bacteria: NORMAL

## 2015-04-01 NOTE — Telephone Encounter (Signed)
-----   Message from Lance Bosch, NP sent at 04/01/2015  8:55 AM EST ----- Normal throat culture

## 2015-04-01 NOTE — Telephone Encounter (Signed)
Tried to contact patient Patient not available Message left on voice mail to return our call 

## 2015-04-17 MED FILL — HYDROCHLOROTHIAZIDE 25 MG T: 25 | 30 days supply | Qty: 30 | Fill #3

## 2015-05-21 MED FILL — HYDROCHLOROTHIAZIDE 25 MG T: 25 | 30 days supply | Qty: 30 | Fill #4

## 2015-06-22 ENCOUNTER — Other Ambulatory Visit: Payer: Self-pay | Admitting: Internal Medicine

## 2015-06-22 MED FILL — !LOVAZA 1 GM CAPSULE: 1 | 30 days supply | Qty: 120 | Fill #2

## 2015-06-22 MED FILL — ?HYDROCHLOROTHIAZIDE 25 MG: 25 MG | 30 days supply | Qty: 30 | Fill #0

## 2015-07-24 ENCOUNTER — Other Ambulatory Visit: Payer: Self-pay | Admitting: Internal Medicine

## 2015-07-24 MED FILL — HYDROCHLOROTHIAZIDE 25 MG T: 25 | 30 days supply | Qty: 30 | Fill #0

## 2015-08-06 ENCOUNTER — Ambulatory Visit: Payer: Self-pay | Admitting: Family Medicine

## 2015-08-24 MED FILL — HYDROCHLOROTHIAZIDE 25 MG T: 25 | 30 days supply | Qty: 30 | Fill #1

## 2015-09-16 ENCOUNTER — Telehealth: Payer: Self-pay | Admitting: *Deleted

## 2015-09-16 MED ORDER — HYDROCHLOROTHIAZIDE 25 MG PO TABS: ORAL_TABLET | ORAL | 0 refills | Status: DC

## 2015-09-16 NOTE — Telephone Encounter (Signed)
Patient verified DOB Patients HCTZ was refilled for 30 days due to facility rescheduling patients appointment because PCP was OOO. Patient advised to call on Monday 09/21/15 to obtain a new appointment. No further questions at this time.

## 2015-09-25 MED FILL — HYDROCHLOROTHIAZIDE 25 MG T: 25 | 30 days supply | Qty: 30 | Fill #0

## 2015-09-30 ENCOUNTER — Ambulatory Visit: Payer: Self-pay

## 2015-10-08 ENCOUNTER — Ambulatory Visit: Payer: Self-pay

## 2015-10-23 ENCOUNTER — Encounter: Payer: Self-pay | Admitting: Family Medicine

## 2015-10-23 ENCOUNTER — Other Ambulatory Visit: Payer: Self-pay | Admitting: Internal Medicine

## 2015-10-23 ENCOUNTER — Ambulatory Visit: Payer: Self-pay | Attending: Family Medicine | Admitting: Family Medicine

## 2015-10-23 DIAGNOSIS — Z882 Allergy status to sulfonamides status: Secondary | ICD-10-CM | POA: Insufficient documentation

## 2015-10-23 DIAGNOSIS — Z0000 Encounter for general adult medical examination without abnormal findings: Secondary | ICD-10-CM | POA: Insufficient documentation

## 2015-10-23 DIAGNOSIS — Z30432 Encounter for removal of intrauterine contraceptive device: Secondary | ICD-10-CM

## 2015-10-23 DIAGNOSIS — Z1239 Encounter for other screening for malignant neoplasm of breast: Secondary | ICD-10-CM

## 2015-10-23 DIAGNOSIS — L739 Follicular disorder, unspecified: Secondary | ICD-10-CM | POA: Insufficient documentation

## 2015-10-23 DIAGNOSIS — Z1211 Encounter for screening for malignant neoplasm of colon: Secondary | ICD-10-CM

## 2015-10-23 DIAGNOSIS — Z23 Encounter for immunization: Secondary | ICD-10-CM | POA: Insufficient documentation

## 2015-10-23 DIAGNOSIS — I10 Essential (primary) hypertension: Secondary | ICD-10-CM | POA: Insufficient documentation

## 2015-10-23 DIAGNOSIS — Z124 Encounter for screening for malignant neoplasm of cervix: Secondary | ICD-10-CM

## 2015-10-23 DIAGNOSIS — Z880 Allergy status to penicillin: Secondary | ICD-10-CM | POA: Insufficient documentation

## 2015-10-23 MED ORDER — CLINDAMYCIN PHOSPHATE 2 % VA CREA: 1.0000 | TOPICAL_CREAM | Freq: Every day | VAGINAL | 0 refills | Status: DC

## 2015-10-23 NOTE — Patient Instructions (Signed)
Health Maintenance, Female Adopting a healthy lifestyle and getting preventive care can go a long way to promote health and wellness. Talk with your health care provider about what schedule of regular examinations is right for you. This is a good chance for you to check in with your provider about disease prevention and staying healthy. In between checkups, there are plenty of things you can do on your own. Experts have done a lot of research about which lifestyle changes and preventive measures are most likely to keep you healthy. Ask your health care provider for more information. WEIGHT AND DIET  Eat a healthy diet  Be sure to include plenty of vegetables, fruits, low-fat dairy products, and lean protein.  Do not eat a lot of foods high in solid fats, added sugars, or salt.  Get regular exercise. This is one of the most important things you can do for your health.  Most adults should exercise for at least 150 minutes each week. The exercise should increase your heart rate and make you sweat (moderate-intensity exercise).  Most adults should also do strengthening exercises at least twice a week. This is in addition to the moderate-intensity exercise.  Maintain a healthy weight  Body mass index (BMI) is a measurement that can be used to identify possible weight problems. It estimates body fat based on height and weight. Your health care provider can help determine your BMI and help you achieve or maintain a healthy weight.  For females 28 years of age and older:   A BMI below 18.5 is considered underweight.  A BMI of 18.5 to 24.9 is normal.  A BMI of 25 to 29.9 is considered overweight.  A BMI of 30 and above is considered obese.  Watch levels of cholesterol and blood lipids  You should start having your blood tested for lipids and cholesterol at 51 years of age, then have this test every 5 years.  You may need to have your cholesterol levels checked more often if:  Your lipid  or cholesterol levels are high.  You are older than 51 years of age.  You are at high risk for heart disease.  CANCER SCREENING   Lung Cancer  Lung cancer screening is recommended for adults 51-66 years old who are at high risk for lung cancer because of a history of smoking.  A yearly low-dose CT scan of the lungs is recommended for people who:  Currently smoke.  Have quit within the past 15 years.  Have at least a 30-pack-year history of smoking. A pack year is smoking an average of one pack of cigarettes a day for 1 year.  Yearly screening should continue until it has been 15 years since you quit.  Yearly screening should stop if you develop a health problem that would prevent you from having lung cancer treatment.  Breast Cancer  Practice breast self-awareness. This means understanding how your breasts normally appear and feel.  It also means doing regular breast self-exams. Let your health care provider know about any changes, no matter how small.  If you are in your 20s or 30s, you should have a clinical breast exam (CBE) by a health care provider every 1-3 years as part of a regular health exam.  If you are 25 or older, have a CBE every year. Also consider having a breast X-ray (mammogram) every year.  If you have a family history of breast cancer, talk to your health care provider about genetic screening.  If you  are at high risk for breast cancer, talk to your health care provider about having an MRI and a mammogram every year.  Breast cancer gene (BRCA) assessment is recommended for women who have family members with BRCA-related cancers. BRCA-related cancers include:  Breast.  Ovarian.  Tubal.  Peritoneal cancers.  Results of the assessment will determine the need for genetic counseling and BRCA1 and BRCA2 testing. Cervical Cancer Your health care provider may recommend that you be screened regularly for cancer of the pelvic organs (ovaries, uterus, and  vagina). This screening involves a pelvic examination, including checking for microscopic changes to the surface of your cervix (Pap test). You may be encouraged to have this screening done every 3 years, beginning at age 21.  For women ages 30-65, health care providers may recommend pelvic exams and Pap testing every 3 years, or they may recommend the Pap and pelvic exam, combined with testing for human papilloma virus (HPV), every 5 years. Some types of HPV increase your risk of cervical cancer. Testing for HPV may also be done on women of any age with unclear Pap test results.  Other health care providers may not recommend any screening for nonpregnant women who are considered low risk for pelvic cancer and who do not have symptoms. Ask your health care provider if a screening pelvic exam is right for you.  If you have had past treatment for cervical cancer or a condition that could lead to cancer, you need Pap tests and screening for cancer for at least 20 years after your treatment. If Pap tests have been discontinued, your risk factors (such as having a new sexual partner) need to be reassessed to determine if screening should resume. Some women have medical problems that increase the chance of getting cervical cancer. In these cases, your health care provider may recommend more frequent screening and Pap tests. Colorectal Cancer  This type of cancer can be detected and often prevented.  Routine colorectal cancer screening usually begins at 50 years of age and continues through 51 years of age.  Your health care provider may recommend screening at an earlier age if you have risk factors for colon cancer.  Your health care provider may also recommend using home test kits to check for hidden blood in the stool.  A small camera at the end of a tube can be used to examine your colon directly (sigmoidoscopy or colonoscopy). This is done to check for the earliest forms of colorectal  cancer.  Routine screening usually begins at age 50.  Direct examination of the colon should be repeated every 5-10 years through 51 years of age. However, you may need to be screened more often if early forms of precancerous polyps or small growths are found. Skin Cancer  Check your skin from head to toe regularly.  Tell your health care provider about any new moles or changes in moles, especially if there is a change in a mole's shape or color.  Also tell your health care provider if you have a mole that is larger than the size of a pencil eraser.  Always use sunscreen. Apply sunscreen liberally and repeatedly throughout the day.  Protect yourself by wearing long sleeves, pants, a wide-brimmed hat, and sunglasses whenever you are outside. HEART DISEASE, DIABETES, AND HIGH BLOOD PRESSURE   High blood pressure causes heart disease and increases the risk of stroke. High blood pressure is more likely to develop in:  People who have blood pressure in the high end   of the normal range (130-139/85-89 mm Hg).  People who are overweight or obese.  People who are African American.  If you are 38-23 years of age, have your blood pressure checked every 3-5 years. If you are 61 years of age or older, have your blood pressure checked every year. You should have your blood pressure measured twice--once when you are at a hospital or clinic, and once when you are not at a hospital or clinic. Record the average of the two measurements. To check your blood pressure when you are not at a hospital or clinic, you can use:  An automated blood pressure machine at a pharmacy.  A home blood pressure monitor.  If you are between 45 years and 39 years old, ask your health care provider if you should take aspirin to prevent strokes.  Have regular diabetes screenings. This involves taking a blood sample to check your fasting blood sugar level.  If you are at a normal weight and have a low risk for diabetes,  have this test once every three years after 51 years of age.  If you are overweight and have a high risk for diabetes, consider being tested at a younger age or more often. PREVENTING INFECTION  Hepatitis B  If you have a higher risk for hepatitis B, you should be screened for this virus. You are considered at high risk for hepatitis B if:  You were born in a country where hepatitis B is common. Ask your health care provider which countries are considered high risk.  Your parents were born in a high-risk country, and you have not been immunized against hepatitis B (hepatitis B vaccine).  You have HIV or AIDS.  You use needles to inject street drugs.  You live with someone who has hepatitis B.  You have had sex with someone who has hepatitis B.  You get hemodialysis treatment.  You take certain medicines for conditions, including cancer, organ transplantation, and autoimmune conditions. Hepatitis C  Blood testing is recommended for:  Everyone born from [birth year] through 1966.  Anyone with known risk factors for hepatitis C. Sexually transmitted infections (STIs)  You should be screened for sexually transmitted infections (STIs) including gonorrhea and chlamydia if:  You are sexually active and are younger than 51 years of age.  You are older than 51 years of age and your health care provider tells you that you are at risk for this type of infection.  Your sexual activity has changed since you were last screened and you are at an increased risk for chlamydia or gonorrhea. Ask your health care provider if you are at risk.  If you do not have HIV, but are at risk, it may be recommended that you take a prescription medicine daily to prevent HIV infection. This is called pre-exposure prophylaxis (PrEP). You are considered at risk if:  You are sexually active and do not regularly use condoms or know the HIV status of your partner(s).  You take drugs by injection.  You are sexually  active with a partner who has HIV. Talk with your health care provider about whether you are at high risk of being infected with HIV. If you choose to begin PrEP, you should first be tested for HIV. You should then be tested every 3 months for as long as you are taking PrEP.  PREGNANCY   If you are premenopausal and you may become pregnant, ask your health care provider about preconception counseling.  If you may  become pregnant, take 400 to 800 micrograms (mcg) of folic acid every day.  If you want to prevent pregnancy, talk to your health care provider about birth control (contraception). OSTEOPOROSIS AND MENOPAUSE   Osteoporosis is a disease in which the bones lose minerals and strength with aging. This can result in serious bone fractures. Your risk for osteoporosis can be identified using a bone density scan.  If you are 61 years of age or older, or if you are at risk for osteoporosis and fractures, ask your health care provider if you should be screened.  Ask your health care provider whether you should take a calcium or vitamin D supplement to lower your risk for osteoporosis.  Menopause may have certain physical symptoms and risks.  Hormone replacement therapy may reduce some of these symptoms and risks. Talk to your health care provider about whether hormone replacement therapy is right for you.  HOME CARE INSTRUCTIONS   Schedule regular health, dental, and eye exams.  Stay current with your immunizations.   Do not use any tobacco products including cigarettes, chewing tobacco, or electronic cigarettes.  If you are pregnant, do not drink alcohol.  If you are breastfeeding, limit how much and how often you drink alcohol.  Limit alcohol intake to no more than 1 drink per day for nonpregnant women. One drink equals 12 ounces of beer, 5 ounces of wine, or 1 ounces of hard liquor.  Do not use street drugs.  Do not share needles.  Ask your health care provider for help if  you need support or information about quitting drugs.  Tell your health care provider if you often feel depressed.  Tell your health care provider if you have ever been abused or do not feel safe at home.   This information is not intended to replace advice given to you by your health care provider. Make sure you discuss any questions you have with your health care provider.   Document Released: 08/16/2010 Document Revised: 02/21/2014 Document Reviewed: 01/02/2013 Elsevier Interactive Patient Education [employer].

## 2015-10-23 NOTE — Progress Notes (Signed)
Goes to [facility]- anxiety/ depression IUD- 11-12 years  Needs it removed.  Has severe cramping Has folliculitis in groin area- would like the cleocin solution

## 2015-10-23 NOTE — Progress Notes (Signed)
Subjective:  Patient ID: Stacy Barrett, female    DOB: Jan 14, 1966  Age: 50 y.o. MRN: [id number]  CC: Hypertension; Depression; Annual Exam; and spondylosis   HPI BRYONA HOLIFIELD presents for a complete physical exam. She is requesting a refill of topical antibiotic cream which she uses for intermittent folliculitis in her groin area which she has as a result of shaving or irritation of her underwear. She would like her ears checked as she sometimes has fullness and pain in them.  Past Medical History:  Diagnosis Date  . Anxiety   . Depression   . Edema   . Hypertension   . Lumbar radiculopathy   . Pituitary adenoma ([place])   . Vertebral fracture     History reviewed. No pertinent surgical history.  Allergies  Allergen Reactions  . Other Other (See Comments)    MSG -Very intense headaches  . Penicillins Hives  . Prednisone Itching, Swelling and Rash    Swelling of lips and face  . Sulfa Antibiotics Hives and Itching      Outpatient Medications Prior to Visit  Medication Sig Dispense Refill  . gabapentin (NEURONTIN) 100 MG capsule Take 200 mg by mouth 2 (two) times daily.     . hydrochlorothiazide (HYDRODIURIL) 25 MG tablet TAKE 1 TABLET (25 MG TOTAL) BY MOUTH DAILY. FOR HIGH BLOOD PRESSURE - NEEDS OFFICE VISIT 30 tablet 0  . sertraline (ZOLOFT) 100 MG tablet Take 2 tablets (200 mg total) by mouth daily. For depression 60 tablet 0  . clindamycin (CLEOCIN) 300 MG capsule Take 1 capsule (300 mg total) by mouth 2 (two) times daily. (Patient not taking: Reported on 10/23/2015) 14 capsule 0  . azithromycin (ZITHROMAX) 250 MG tablet Take 1 tablet (250 mg total) by mouth daily. 4 tablet 0   No facility-administered medications prior to visit.     ROS Review of Systems  Constitutional: Negative for activity change, appetite change and fatigue.  HENT: Negative for congestion, sinus pressure and sore throat.   Eyes: Negative for visual disturbance.  Respiratory: Negative for  cough, chest tightness, shortness of breath and wheezing.   Cardiovascular: Negative for chest pain and palpitations.  Gastrointestinal: Negative for abdominal distention, abdominal pain and constipation.  Endocrine: Negative for polydipsia.  Genitourinary: Negative for dysuria and frequency.  Musculoskeletal: Negative for arthralgias and back pain.  Skin:       See history of present illness  Neurological: Negative for tremors, light-headedness and numbness.  Hematological: Does not bruise/bleed easily.  Psychiatric/Behavioral: Negative for agitation and behavioral problems.    Objective:  BP 112/78 (BP Location: Right Arm, Patient Position: Sitting, Cuff Size: Large)   Pulse 85   Temp 98.3 F (36.8 C) (Oral)   Ht 5\' 5"  (1.651 m)   Wt 200 lb 9.6 oz (91 kg)   SpO2 98%   BMI 33.38 kg/m   BP/Weight 10/23/2015 03/30/2015 0000000  Systolic BP XX123456 [id number] 0000000  Diastolic BP 78 85 81  Wt. (Lbs) 200.6 202 -  BMI 33.38 33.61 -  Some encounter information is confidential and restricted. Go to Review Flowsheets activity to see all data.      Physical Exam  Constitutional: She is oriented to person, place, and time. She appears well-developed and well-nourished. No distress.  HENT:  Head: Normocephalic.  Right Ear: External ear normal.  Left Ear: External ear normal.  Nose: Nose normal.  Mouth/Throat: Oropharynx is clear and moist.  Eyes: Conjunctivae and EOM are normal. Pupils are equal,  round, and reactive to light.  Neck: Normal range of motion. No JVD present.  Cardiovascular: Normal rate, regular rhythm, normal heart sounds and intact distal pulses.  Exam reveals no gallop.   No murmur heard. Pulmonary/Chest: Effort normal and breath sounds normal. No respiratory distress. She has no wheezes. She has no rales. She exhibits no tenderness. Right breast exhibits no mass and no tenderness. Left breast exhibits no mass and no tenderness.  Abdominal: Soft. Bowel sounds are normal. She  exhibits no distension and no mass. There is no tenderness.  Genitourinary:  Genitourinary Comments: Normal external genitalia, normal vagina. String of IUD visible through the cervical os. Normal adnexa  Musculoskeletal: Normal range of motion. She exhibits no edema or tenderness.  Neurological: She is alert and oriented to person, place, and time. She has normal reflexes.  Skin: Skin is warm and dry. She is not diaphoretic.  Psychiatric: She has a normal mood and affect.     Assessment & Plan:   1. Routine general medical examination at a health care facility - HIV antibody; Future - TSH; Future - COMPLETE METABOLIC PANEL WITH GFR; Future - Lipid panel; Future - Prolactin; Future  2. Need for immunization against influenza - Flu Vaccine QUAD 36+ mos IM (Fluarix)  3. Encounter for IUD removal IUD removed with the aid of a ring forceps and patient tolerated procedure well. - Cath Tip Culture  4. Screening for breast cancer - Mammogram Digital Screening; Future  5. Screening for cervical cancer - Cytology - PAP ([place])  6. Special screening for malignant neoplasms, colon - Ambulatory referral to Gastroenterology  7. Need for Tdap vaccination - Tdap vaccine greater than or equal to 7yo IM  8. Folliculitis - clindamycin (CLEOCIN) 2 % vaginal cream; Place 1 Applicatorful vaginally at bedtime.  Dispense: 40 g; Refill: 0   Meds ordered this encounter  Medications  . clindamycin (CLEOCIN) 2 % vaginal cream    Sig: Place 1 Applicatorful vaginally at bedtime.    Dispense:  40 g    Refill:  0    Follow-up: Return in 6 months (on 04/21/2016) for Coordination of care.   Arnoldo Morale MD

## 2015-10-27 LAB — CYTOLOGY - PAP

## 2015-10-28 ENCOUNTER — Ambulatory Visit: Payer: Self-pay | Attending: Internal Medicine

## 2015-10-28 ENCOUNTER — Other Ambulatory Visit: Payer: Self-pay | Admitting: Internal Medicine

## 2015-10-28 DIAGNOSIS — Z23 Encounter for immunization: Secondary | ICD-10-CM

## 2015-10-28 DIAGNOSIS — Z124 Encounter for screening for malignant neoplasm of cervix: Secondary | ICD-10-CM

## 2015-10-28 DIAGNOSIS — Z1239 Encounter for other screening for malignant neoplasm of breast: Secondary | ICD-10-CM

## 2015-10-28 DIAGNOSIS — L739 Follicular disorder, unspecified: Secondary | ICD-10-CM

## 2015-10-28 DIAGNOSIS — Z1211 Encounter for screening for malignant neoplasm of colon: Secondary | ICD-10-CM

## 2015-10-28 DIAGNOSIS — Z30432 Encounter for removal of intrauterine contraceptive device: Secondary | ICD-10-CM

## 2015-10-28 DIAGNOSIS — Z0000 Encounter for general adult medical examination without abnormal findings: Secondary | ICD-10-CM | POA: Insufficient documentation

## 2015-10-28 LAB — LIPID PANEL
CHOL/HDL RATIO: 8 ratio — AB (ref <=5.0)
Cholesterol: 255 mg/dL — ABNORMAL HIGH (ref 125–200)
HDL: 32 mg/dL — AB (ref >=46)
LDL CALC: 163 mg/dL — AB (ref <130)
Triglycerides: 299 mg/dL — ABNORMAL HIGH (ref <150)
VLDL: 60 mg/dL — AB (ref <30)

## 2015-10-28 LAB — COMPLETE METABOLIC PANEL WITH GFR
ALBUMIN: 3.8 g/dL (ref 3.6–5.1)
ALK PHOS: 62 U/L (ref 33–130)
ALT: 13 U/L (ref 6–29)
AST: 14 U/L (ref 10–35)
BUN: 9 mg/dL (ref 7–25)
CHLORIDE: 109 mmol/L (ref 98–110)
CO2: 24 mmol/L (ref 20–31)
Calcium: 9.7 mg/dL (ref 8.6–10.4)
Creat: 0.64 mg/dL (ref 0.50–1.05)
GFR, Est African American: >89 mL/min (ref >=60)
GLUCOSE: 93 mg/dL (ref 65–99)
POTASSIUM: 3.8 mmol/L (ref 3.5–5.3)
SODIUM: 139 mmol/L (ref 135–146)
Total Bilirubin: 0.6 mg/dL (ref 0.2–1.2)
Total Protein: 6.5 g/dL (ref 6.1–8.1)

## 2015-10-28 LAB — TSH: TSH: 2.37 m[IU]/L

## 2015-10-28 MED ORDER — CLINDAMYCIN PHOSPHATE 1 % EX SOLN: Freq: Two times a day (BID) | CUTANEOUS | 0 refills | Status: DC

## 2015-10-28 MED FILL — HYDROCHLOROTHIAZIDE 25 MG T: 25 | 30 days supply | Qty: 30 | Fill #0

## 2015-10-28 NOTE — Progress Notes (Signed)
Pt here for lab visit only 

## 2015-10-28 NOTE — Telephone Encounter (Signed)
Clindamycin vaginal cream prescribed for external folliculitis - ordered external solution in place of cream.

## 2015-10-29 LAB — HIV ANTIBODY (ROUTINE TESTING W REFLEX): HIV 1&2 Ab, 4th Generation: NONREACTIVE

## 2015-10-29 LAB — PROLACTIN: PROLACTIN: 37.5 ng/mL — AB

## 2015-10-30 ENCOUNTER — Telehealth: Payer: Self-pay

## 2015-10-30 NOTE — Telephone Encounter (Signed)
Writer called patient to give her results from her pap which was normal.  Patient waiting to here about her lab results.

## 2015-10-30 NOTE — Telephone Encounter (Signed)
-----   Message from Boykin Nearing, MD sent at 10/29/2015  5:58 PM EDT ----- Pap normal HPV negative Repeat in 3 years

## 2015-11-02 ENCOUNTER — Other Ambulatory Visit: Payer: Self-pay | Admitting: Family Medicine

## 2015-11-02 DIAGNOSIS — E785 Hyperlipidemia, unspecified: Secondary | ICD-10-CM | POA: Insufficient documentation

## 2015-11-02 MED ORDER — ATORVASTATIN CALCIUM 20 MG PO TABS: 20.0000 mg | ORAL_TABLET | Freq: Every day | ORAL | 5 refills | Status: DC

## 2015-11-03 NOTE — Progress Notes (Signed)
[facility] called regarding a change in an order for pt. The order for cath tip (IUD Tip) was changed to an anaerobic test.   Will follow up on test results with [facility].

## 2015-11-05 ENCOUNTER — Telehealth: Payer: Self-pay

## 2015-11-05 NOTE — Telephone Encounter (Signed)
Clld pt - LMOVMTC re lab results. 

## 2015-11-05 NOTE — Telephone Encounter (Signed)
-----   Message from Arnoldo Morale, MD sent at 11/02/2015  1:37 PM EDT ----- Thyroid, electrolytes, liver and kidney function are normal. Cholesterol is elevated and so I have placed her on atorvastatin. Prolactin is slightly elevated and this will be monitored.

## 2015-11-12 ENCOUNTER — Telehealth: Payer: Self-pay | Admitting: Family Medicine

## 2015-11-12 NOTE — Telephone Encounter (Signed)
Called patient regarding lab results.  Verified that patient is to pick up lipitor for high cholesterol.  Discussed life style changes, patient stated understanding.

## 2015-11-12 NOTE — Telephone Encounter (Signed)
Pt. Returned call about her results. Please f/u

## 2015-11-13 ENCOUNTER — Ambulatory Visit: Payer: Self-pay | Attending: Internal Medicine

## 2015-11-15 LAB — ANAEROBIC CULTURE
GRAM STAIN: NONE SEEN
Gram Stain: NONE SEEN

## 2015-11-18 ENCOUNTER — Other Ambulatory Visit: Payer: Self-pay | Admitting: Family Medicine

## 2015-11-19 MED FILL — ATORVASTATIN 20 MG TABLET: 20 | 30 days supply | Qty: 30 | Fill #0

## 2015-11-19 MED FILL — CLINDAMYCIN PH 1% SOLUTION: 1 | 15 days supply | Qty: 30 | Fill #0

## 2015-11-19 MED FILL — HYDROCHLOROTHIAZIDE 25 MG T: 25 | 30 days supply | Qty: 30 | Fill #0

## 2015-12-02 ENCOUNTER — Other Ambulatory Visit: Payer: Self-pay | Admitting: Family Medicine

## 2015-12-02 DIAGNOSIS — Z1231 Encounter for screening mammogram for malignant neoplasm of breast: Secondary | ICD-10-CM

## 2015-12-21 MED FILL — HYDROCHLOROTHIAZIDE 25 MG T: 25 | 30 days supply | Qty: 30 | Fill #1

## 2015-12-21 MED FILL — ATORVASTATIN 20 MG TABLET: 20 | 30 days supply | Qty: 30 | Fill #1

## 2016-01-21 MED FILL — HYDROCHLOROTHIAZIDE 25 MG T: 25 | 30 days supply | Qty: 30 | Fill #2

## 2016-01-21 MED FILL — ATORVASTATIN 20 MG TABLET: 20 | 30 days supply | Qty: 30 | Fill #2

## 2016-01-22 ENCOUNTER — Ambulatory Visit: Payer: Self-pay | Admitting: Family Medicine

## 2016-02-24 MED FILL — HYDROCHLOROTHIAZIDE 25 MG T: 25 | 30 days supply | Qty: 30 | Fill #3

## 2016-02-24 MED FILL — ATORVASTATIN 20 MG TABLET: 20 | 30 days supply | Qty: 30 | Fill #3

## 2016-03-23 ENCOUNTER — Other Ambulatory Visit: Payer: Self-pay | Admitting: Family Medicine

## 2016-03-23 MED FILL — ATORVASTATIN 20 MG TABLET: 20 | 30 days supply | Qty: 30 | Fill #4

## 2016-03-23 MED FILL — HYDROCHLOROTHIAZIDE 25 MG T: 25 | 30 days supply | Qty: 30 | Fill #0

## 2016-03-24 ENCOUNTER — Telehealth: Payer: Self-pay | Admitting: Family Medicine

## 2016-03-24 DIAGNOSIS — K0889 Other specified disorders of teeth and supporting structures: Secondary | ICD-10-CM

## 2016-03-24 DIAGNOSIS — J069 Acute upper respiratory infection, unspecified: Secondary | ICD-10-CM

## 2016-03-24 NOTE — Telephone Encounter (Signed)
Patient called the office to speak with PCP regarding the irritation and whole that she has on her gums. Pt is very concerned. She would like a prescription to be called if possible as well as a referral to be placed (dental ref.) Please follow up  Thank you.

## 2016-03-25 MED ORDER — CLINDAMYCIN HCL 300 MG PO CAPS: 300.0000 mg | ORAL_CAPSULE | Freq: Two times a day (BID) | ORAL | 0 refills | Status: DC

## 2016-03-25 NOTE — Telephone Encounter (Signed)
Done

## 2016-04-22 MED FILL — ATORVASTATIN 20 MG TABLET: 20 | 30 days supply | Qty: 30 | Fill #5

## 2016-04-22 MED FILL — HYDROCHLOROTHIAZIDE 25 MG T: 25 | 30 days supply | Qty: 30 | Fill #1

## 2016-05-24 ENCOUNTER — Ambulatory Visit: Payer: Self-pay | Attending: Internal Medicine

## 2016-05-26 ENCOUNTER — Other Ambulatory Visit: Payer: Self-pay | Admitting: Family Medicine

## 2016-05-26 MED FILL — HYDROCHLOROTHIAZIDE 25 MG T: 25 | 30 days supply | Qty: 30 | Fill #2

## 2016-05-27 ENCOUNTER — Other Ambulatory Visit: Payer: Self-pay

## 2016-05-27 DIAGNOSIS — E7849 Other hyperlipidemia: Secondary | ICD-10-CM

## 2016-05-27 MED ORDER — ATORVASTATIN CALCIUM 20 MG PO TABS: 20.0000 mg | ORAL_TABLET | Freq: Every day | ORAL | 5 refills | Status: DC

## 2016-05-27 MED FILL — ?ATORVASTATIN 20 MG TABLET: 20 | 30 days supply | Qty: 30 | Fill #0

## 2016-06-13 ENCOUNTER — Encounter: Payer: Self-pay | Admitting: Family Medicine

## 2016-06-13 ENCOUNTER — Ambulatory Visit: Payer: Self-pay | Attending: Family Medicine | Admitting: Family Medicine

## 2016-06-13 VITALS — BP 122/81 | HR 67 | Temp 98.2°F | Ht 65.0 in | Wt 185.2 lb

## 2016-06-13 DIAGNOSIS — M5442 Lumbago with sciatica, left side: Secondary | ICD-10-CM | POA: Insufficient documentation

## 2016-06-13 DIAGNOSIS — E7849 Other hyperlipidemia: Secondary | ICD-10-CM

## 2016-06-13 DIAGNOSIS — F329 Major depressive disorder, single episode, unspecified: Secondary | ICD-10-CM | POA: Insufficient documentation

## 2016-06-13 DIAGNOSIS — M5416 Radiculopathy, lumbar region: Secondary | ICD-10-CM | POA: Insufficient documentation

## 2016-06-13 DIAGNOSIS — F419 Anxiety disorder, unspecified: Secondary | ICD-10-CM | POA: Insufficient documentation

## 2016-06-13 DIAGNOSIS — E784 Other hyperlipidemia: Secondary | ICD-10-CM | POA: Insufficient documentation

## 2016-06-13 DIAGNOSIS — M545 Low back pain, unspecified: Secondary | ICD-10-CM | POA: Insufficient documentation

## 2016-06-13 DIAGNOSIS — I10 Essential (primary) hypertension: Secondary | ICD-10-CM | POA: Insufficient documentation

## 2016-06-13 DIAGNOSIS — G8929 Other chronic pain: Secondary | ICD-10-CM | POA: Insufficient documentation

## 2016-06-13 MED ORDER — HYDROCHLOROTHIAZIDE 25 MG PO TABS: 25.0000 mg | ORAL_TABLET | Freq: Every day | ORAL | 5 refills | Status: DC

## 2016-06-13 MED ORDER — TRAMADOL HCL 50 MG PO TABS: 50.0000 mg | ORAL_TABLET | Freq: Two times a day (BID) | ORAL | 1 refills | Status: DC | PRN

## 2016-06-13 MED ORDER — METHOCARBAMOL 750 MG PO TABS: 750.0000 mg | ORAL_TABLET | Freq: Three times a day (TID) | ORAL | 1 refills | Status: DC | PRN

## 2016-06-13 MED ORDER — ATORVASTATIN CALCIUM 20 MG PO TABS: 20.0000 mg | ORAL_TABLET | Freq: Every day | ORAL | 5 refills | Status: DC

## 2016-06-13 NOTE — Progress Notes (Signed)
Subjective:  Patient ID: Stacy Barrett, female    DOB: Nov 23, 1965  Age: 51 y.o. MRN: [id number]  CC: Hypertension; Back Pain (lower back); and Depression   HPI Stacy Barrett is a 51 year old female with a history of hypertension, hyperlipidemia, depression who presents today for a follow-up visit.  She has been compliant with her antihypertensive and a low-sodium diet. She takes statin with no complaints of side effects. Does not exercise regularly.  Mental health is managed at [facility] and she has been compliant with all her psychotropic medications.  She complains of low back pain which has worsened over the last 3 weeks and also right thoracic back pain. She brings in imaging from 2013 where she was informed by her orthopedic that she sustained a fracture between L5-S1 which resulted in chronic pain however she has had a recent flare of the pain which sometimes radiates up her back. Denies loss of sphincteric function or numbness in legs. Her thoracic back pain feels like a toothache and is relieved by taking tramadol.  Past Medical History:  Diagnosis Date  . Anxiety   . Depression   . Edema   . Hypertension   . Lumbar radiculopathy   . Pituitary adenoma ([place])   . Vertebral fracture     History reviewed. No pertinent surgical history.    Outpatient Medications Prior to Visit  Medication Sig Dispense Refill  . busPIRone (BUSPAR) 10 MG tablet Take 10 mg by mouth 2 (two) times daily.    . clindamycin (CLEOCIN T) 1 % external solution Apply topically 2 (two) times daily. 30 mL 0  . clonazePAM (KLONOPIN) 0.5 MG tablet Take 0.5 mg by mouth at bedtime.    . gabapentin (NEURONTIN) 100 MG capsule Take 200 mg by mouth 2 (two) times daily.     Marland Kitchen omeprazole (PRILOSEC) 10 MG capsule Take 10 mg by mouth daily.    . sertraline (ZOLOFT) 100 MG tablet Take 2 tablets (200 mg total) by mouth daily. For depression 60 tablet 0  . atorvastatin (LIPITOR) 20 MG tablet Take 1 tablet (20 mg  total) by mouth daily. 30 tablet 5  . hydrochlorothiazide (HYDRODIURIL) 25 MG tablet TAKE 1 TABLET BY MOUTH DAILY. 30 tablet 2  . omega-3 acid ethyl esters (LOVAZA) 1 g capsule Take 1 g by mouth 2 (two) times daily.    . clindamycin (CLEOCIN) 2 % vaginal cream Place 1 Applicatorful vaginally at bedtime. 40 g 0  . clindamycin (CLEOCIN) 300 MG capsule Take 1 capsule (300 mg total) by mouth 2 (two) times daily. 14 capsule 0  . mirtazapine (REMERON) 15 MG tablet Take 15 mg by mouth at bedtime.     No facility-administered medications prior to visit.     ROS Review of Systems  Constitutional: Negative for activity change, appetite change and fatigue.  HENT: Negative for congestion, sinus pressure and sore throat.   Eyes: Negative for visual disturbance.  Respiratory: Negative for cough, chest tightness, shortness of breath and wheezing.   Cardiovascular: Negative for chest pain and palpitations.  Gastrointestinal: Negative for abdominal distention, abdominal pain and constipation.  Endocrine: Negative for polydipsia.  Genitourinary: Negative for dysuria and frequency.  Musculoskeletal: Positive for back pain. Negative for arthralgias.  Skin: Negative for rash.  Neurological: Negative for tremors, light-headedness and numbness.  Hematological: Does not bruise/bleed easily.  Psychiatric/Behavioral: Negative for agitation and behavioral problems.    Objective:  BP 122/81 (BP Location: Right Arm, Patient Position: Sitting, Cuff Size: Small)  Pulse 67   Temp 98.2 F (36.8 C) (Oral)   Ht '5\' 5"'  (1.651 m)   Wt 185 lb 3.2 oz (84 kg)   SpO2 97%   BMI 30.82 kg/m   BP/Weight 06/13/2016 10/23/2015 08/18/8887  Systolic BP 169 450 388  Diastolic BP 81 78 85  Wt. (Lbs) 185.2 200.6 202  BMI 30.82 33.38 33.61  Some encounter information is confidential and restricted. Go to Review Flowsheets activity to see all data.      Physical Exam  Constitutional: She is oriented to person, place, and  time. She appears well-developed and well-nourished.  Cardiovascular: Normal rate, normal heart sounds and intact distal pulses.   No murmur heard. Pulmonary/Chest: Effort normal and breath sounds normal. She has no wheezes. She has no rales. She exhibits no tenderness.  Abdominal: Soft. Bowel sounds are normal. She exhibits no distension and no mass. There is no tenderness.  Musculoskeletal: She exhibits tenderness (tenderness on palpation of right side of posterior thorax and lumbar spine.Marland Kitchen Positive straight leg raise bilaterally.).  Neurological: She is alert and oriented to person, place, and time.     CMP Latest Ref Rng & Units 10/28/2015 12/18/2014 07/18/2014  Glucose 65 - 99 mg/dL 93 88 87  BUN 7 - 25 mg/dL '9 13 19  ' Creatinine 0.50 - 1.05 mg/dL 0.64 0.71 0.65  Sodium 135 - 146 mmol/L 139 135 142  Potassium 3.5 - 5.3 mmol/L 3.8 3.3(L) 4.6  Chloride 98 - 110 mmol/L 109 100(L) 105  CO2 20 - 31 mmol/L 24 21(L) 29  Calcium 8.6 - 10.4 mg/dL 9.7 10.7(H) 10.0  Total Protein 6.1 - 8.1 g/dL 6.5 - 6.5  Total Bilirubin 0.2 - 1.2 mg/dL 0.6 - 0.4  Alkaline Phos 33 - 130 U/L 62 - 69  AST 10 - 35 U/L 14 - 16  ALT 6 - 29 U/L 13 - 17    Lipid Panel     Component Value Date/Time   CHOL 255 (H) 10/28/2015 1104   TRIG 299 (H) 10/28/2015 1104   HDL 32 (L) 10/28/2015 1104   CHOLHDL 8.0 (H) 10/28/2015 1104   VLDL 60 (H) 10/28/2015 1104   LDLCALC 163 (H) 10/28/2015 1104     Assessment & Plan:   1. Other hyperlipidemia Uncontrolled previously Continue Lipitor and low-cholesterol diet We will repeat lipid panel - atorvastatin (LIPITOR) 20 MG tablet; Take 1 tablet (20 mg total) by mouth daily.  Dispense: 30 tablet; Refill: 5  2. Essential hypertension Controlled Low-sodium diet - hydrochlorothiazide (HYDRODIURIL) 25 MG tablet; Take 1 tablet (25 mg total) by mouth daily.  Dispense: 30 tablet; Refill: 5 - CMP14+EGFR; Future - Lipid panel; Future  3. Chronic midline low back pain with  left-sided sciatica Acute on chronic Will obtain previous records from orthopedics Consider imaging at next visit Apply heat - traMADol (ULTRAM) 50 MG tablet; Take 1 tablet (50 mg total) by mouth every 12 (twelve) hours as needed.  Dispense: 40 tablet; Refill: 1 - methocarbamol (ROBAXIN-750) 750 MG tablet; Take 1 tablet (750 mg total) by mouth every 8 (eight) hours as needed for muscle spasms.  Dispense: 90 tablet; Refill: 1   Meds ordered this encounter  Medications  . hydrochlorothiazide (HYDRODIURIL) 25 MG tablet    Sig: Take 1 tablet (25 mg total) by mouth daily.    Dispense:  30 tablet    Refill:  5  . atorvastatin (LIPITOR) 20 MG tablet    Sig: Take 1 tablet (20 mg total) by mouth  daily.    Dispense:  30 tablet    Refill:  5  . traMADol (ULTRAM) 50 MG tablet    Sig: Take 1 tablet (50 mg total) by mouth every 12 (twelve) hours as needed.    Dispense:  40 tablet    Refill:  1  . methocarbamol (ROBAXIN-750) 750 MG tablet    Sig: Take 1 tablet (750 mg total) by mouth every 8 (eight) hours as needed for muscle spasms.    Dispense:  90 tablet    Refill:  1    Follow-up: Return in about 1 month (around 07/13/2016) for follow up on back pain.   Arnoldo Morale MD

## 2016-06-13 NOTE — Patient Instructions (Signed)

## 2016-06-14 ENCOUNTER — Ambulatory Visit: Payer: Self-pay | Attending: Family Medicine

## 2016-06-14 DIAGNOSIS — I10 Essential (primary) hypertension: Secondary | ICD-10-CM | POA: Insufficient documentation

## 2016-06-14 NOTE — Progress Notes (Signed)
Patient her for lab visit only 

## 2016-06-15 LAB — CMP14+EGFR
ALBUMIN: 4.1 g/dL (ref 3.5–5.5)
ALT: 12 IU/L (ref 0–32)
AST: 14 IU/L (ref 0–40)
Albumin/Globulin Ratio: 1.9 (ref 1.2–2.2)
Alkaline Phosphatase: 85 IU/L (ref 39–117)
BILIRUBIN TOTAL: 0.3 mg/dL (ref 0.0–1.2)
BUN / CREAT RATIO: 26 — AB (ref 9–23)
BUN: 19 mg/dL (ref 6–24)
CALCIUM: 10.2 mg/dL (ref 8.7–10.2)
CO2: 26 mmol/L (ref 18–29)
CREATININE: 0.72 mg/dL (ref 0.57–1.00)
Chloride: 102 mmol/L (ref 96–106)
GFR, EST AFRICAN AMERICAN: 111 mL/min/{1.73_m2} (ref >59)
GFR, EST NON AFRICAN AMERICAN: 97 mL/min/{1.73_m2} (ref >59)
GLUCOSE: 94 mg/dL (ref 65–99)
Globulin, Total: 2.2 g/dL (ref 1.5–4.5)
Potassium: 4.2 mmol/L (ref 3.5–5.2)
Sodium: 143 mmol/L (ref 134–144)
TOTAL PROTEIN: 6.3 g/dL (ref 6.0–8.5)

## 2016-06-15 LAB — LIPID PANEL
CHOL/HDL RATIO: 3.9 ratio (ref 0.0–4.4)
Cholesterol, Total: 148 mg/dL (ref 100–199)
HDL: 38 mg/dL — AB (ref >39)
LDL CALC: 86 mg/dL (ref 0–99)
Triglycerides: 118 mg/dL (ref 0–149)
VLDL CHOLESTEROL CAL: 24 mg/dL (ref 5–40)

## 2016-06-16 ENCOUNTER — Telehealth: Payer: Self-pay

## 2016-06-16 NOTE — Telephone Encounter (Signed)
Cll pt - LM w/her mom to call re lab results.

## 2016-06-17 ENCOUNTER — Telehealth: Payer: Self-pay | Admitting: Family Medicine

## 2016-06-17 NOTE — Telephone Encounter (Signed)
Returned pt's cll - adsvd of lab results. Pt stated she understood and will keep doing what she is doing!!!

## 2016-06-27 MED FILL — ATORVASTATIN 20 MG TABLET: 20 | 30 days supply | Qty: 30 | Fill #1

## 2016-06-27 MED FILL — HYDROCHLOROTHIAZIDE 25 MG T: 25 | 30 days supply | Qty: 30 | Fill #0

## 2016-07-14 MED FILL — traMADol HCL 50 MG TABS: 50 | 20 days supply | Qty: 40 | Fill #0

## 2016-07-14 MED FILL — METHOCARBAMOL 750 MG TABLET: 750 | 30 days supply | Qty: 90 | Fill #0

## 2016-07-18 ENCOUNTER — Ambulatory Visit: Payer: Self-pay | Admitting: Family Medicine

## 2016-07-25 MED FILL — HYDROCHLOROTHIAZIDE 25 MG T: 25 | 30 days supply | Qty: 30 | Fill #1

## 2016-08-02 ENCOUNTER — Ambulatory Visit: Payer: Self-pay | Admitting: Family Medicine

## 2016-08-03 MED FILL — ATORVASTATIN 20 MG TABLET: 20 | 30 days supply | Qty: 30 | Fill #2

## 2016-08-23 ENCOUNTER — Other Ambulatory Visit: Payer: Self-pay | Admitting: Family Medicine

## 2016-08-23 DIAGNOSIS — M5442 Lumbago with sciatica, left side: Principal | ICD-10-CM

## 2016-08-23 DIAGNOSIS — G8929 Other chronic pain: Secondary | ICD-10-CM

## 2016-08-23 MED FILL — traMADol HCL 50 MG TABS: 50 | 20 days supply | Qty: 40 | Fill #1

## 2016-08-23 MED FILL — HYDROCHLOROTHIAZIDE 25 MG T: 25 | 30 days supply | Qty: 30 | Fill #2

## 2016-09-19 MED FILL — ATORVASTATIN 20 MG TABLET: 20 | 30 days supply | Qty: 30 | Fill #3

## 2016-09-19 MED FILL — HYDROCHLOROTHIAZIDE 25 MG T: 25 | 30 days supply | Qty: 30 | Fill #3

## 2016-10-19 MED FILL — HYDROCHLOROTHIAZIDE 25 MG T: 25 | 30 days supply | Qty: 30 | Fill #4

## 2016-10-19 MED FILL — ?ATORVASTATIN 20 MG TABLET: 20 | 30 days supply | Qty: 30 | Fill #4

## 2016-11-23 MED FILL — ?ATORVASTATIN 20 MG TABLET: 20 | 30 days supply | Qty: 30 | Fill #5

## 2016-11-23 MED FILL — HYDROCHLOROTHIAZIDE 25 MG T: 25 | 30 days supply | Qty: 30 | Fill #5

## 2016-12-26 ENCOUNTER — Other Ambulatory Visit: Payer: Self-pay | Admitting: Family Medicine

## 2016-12-26 DIAGNOSIS — I10 Essential (primary) hypertension: Secondary | ICD-10-CM

## 2016-12-26 MED FILL — ?ATORVASTATIN 20 MG TABLET: 20 | 30 days supply | Qty: 30 | Fill #0

## 2016-12-27 MED FILL — HYDROCHLOROTHIAZIDE 25 MG T: 25 | 30 days supply | Qty: 30 | Fill #0

## 2017-01-30 MED FILL — HYDROCHLOROTHIAZIDE 25 MG T: 25 | 30 days supply | Qty: 30 | Fill #1

## 2017-01-30 MED FILL — ?ATORVASTATIN 20 MG TABLET: 20 | 30 days supply | Qty: 30 | Fill #1

## 2017-02-23 IMAGING — CT CT ANGIO CHEST
2 of 6 series · 19 of 36 positions shown · IV contrast (Omni 300)
Comparison: None.

CLINICAL DATA: Mid chest tightness and shortness of breath.
Symptoms for 1 week. Symptoms worse with exertion.

EXAM:
CT ANGIOGRAPHY CHEST WITH CONTRAST
TECHNIQUE: Multidetector CT imaging of the chest was performed using the
standard protocol during bolus administration of intravenous
contrast. Multiplanar CT image reconstructions and MIPs were
obtained to evaluate the vascular anatomy.
CONTRAST:  100mL OMNIPAQUE IOHEXOL 350 MG/ML SOLN

[Series 6: pe thins · axial · 0.65mm/px · z∈[+1288,+1508]mm · 18 of 489 slices shown]
[im 24/489  lung]
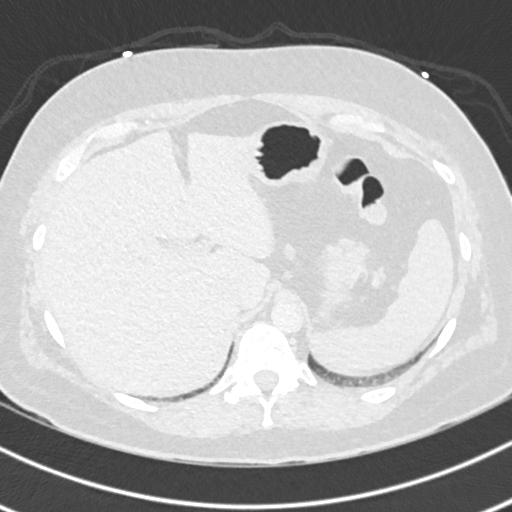
[im 47/489  mediastinal]
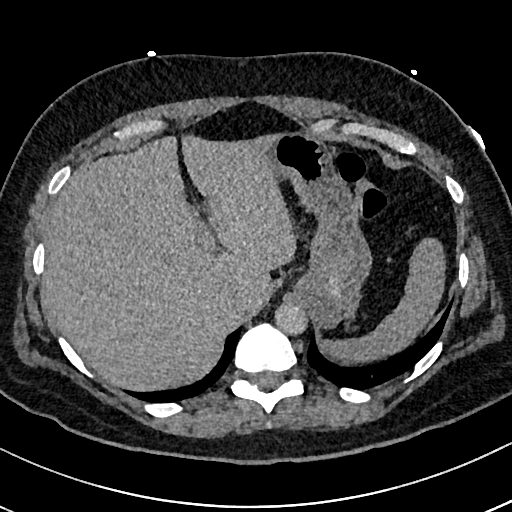
[im 70/489  lung]
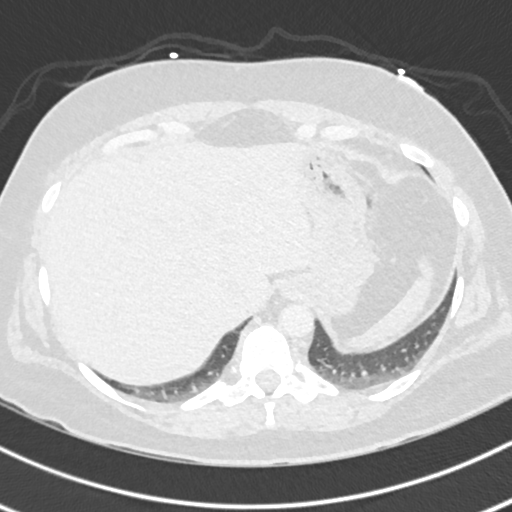
[im 93/489  mediastinal]
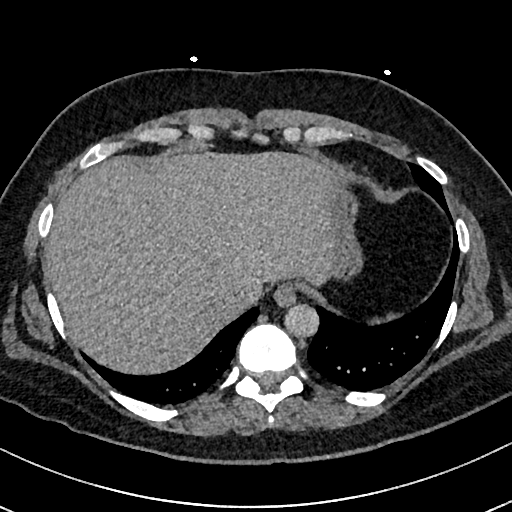
[im 140/489  lung]
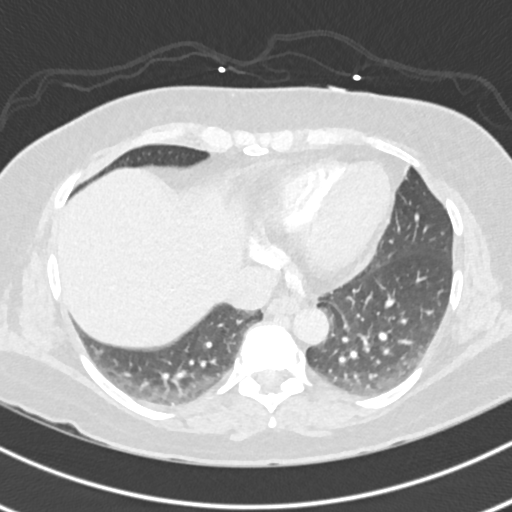
[im 163/489  mediastinal]
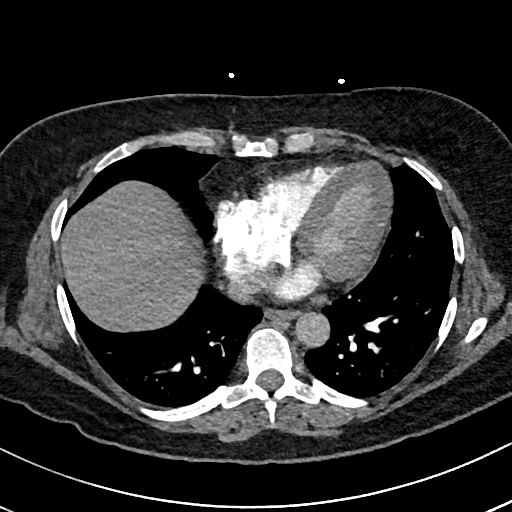
[im 186/489  lung]
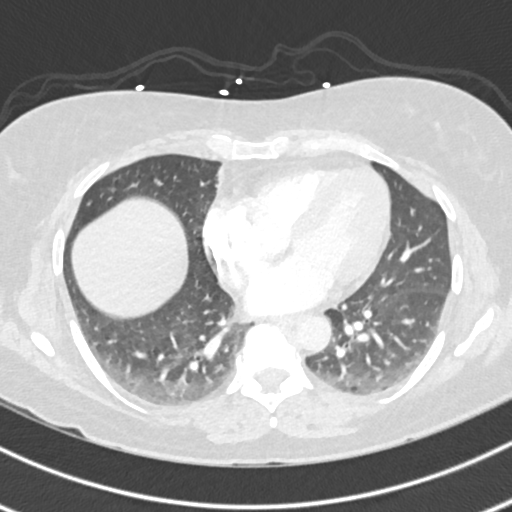
[im 210/489  mediastinal]
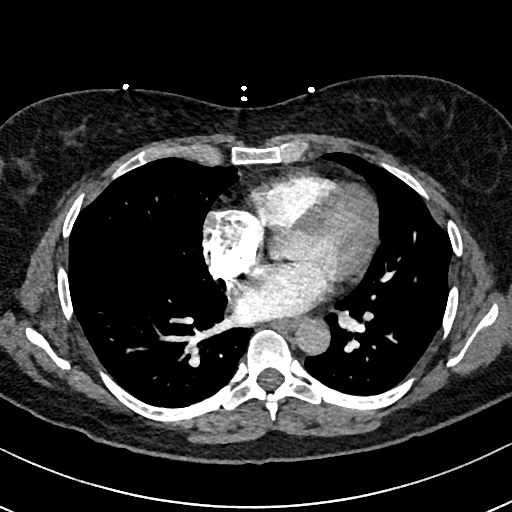
[im 233/489  lung]
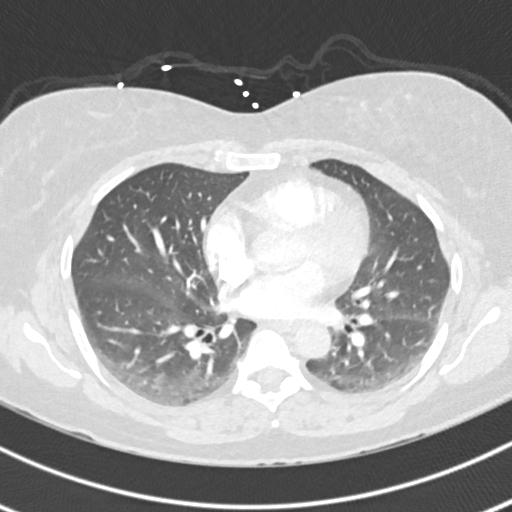
[im 256/489  mediastinal]
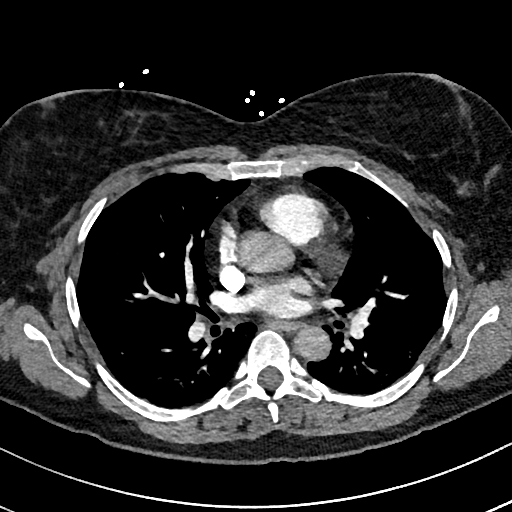
[im 279/489  lung]
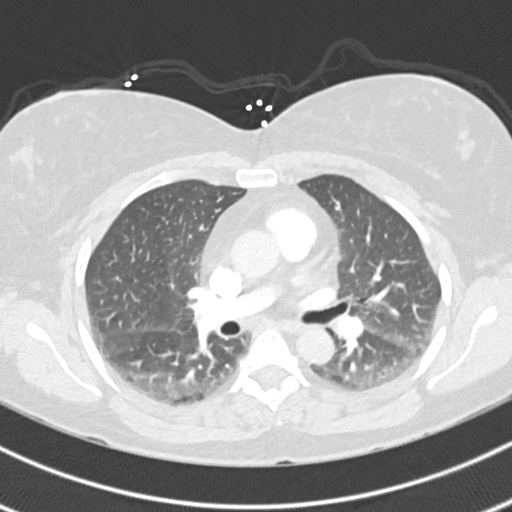
[im 303/489  mediastinal]
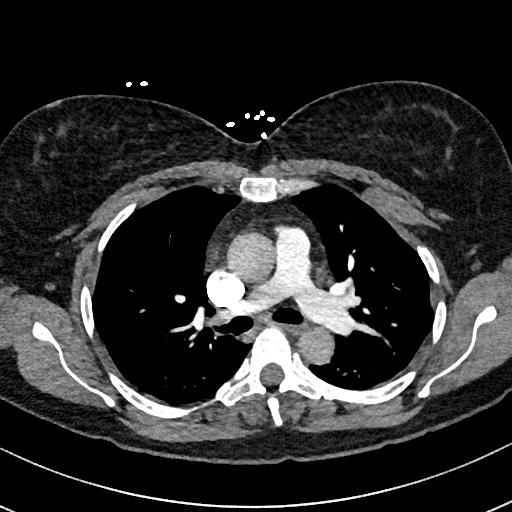
[im 326/489  lung]
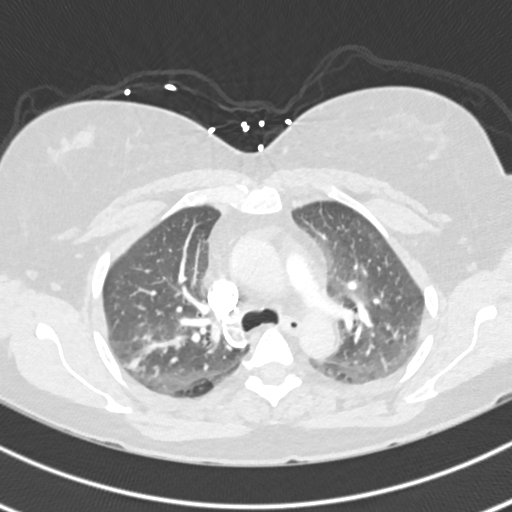
[im 372/489  mediastinal]
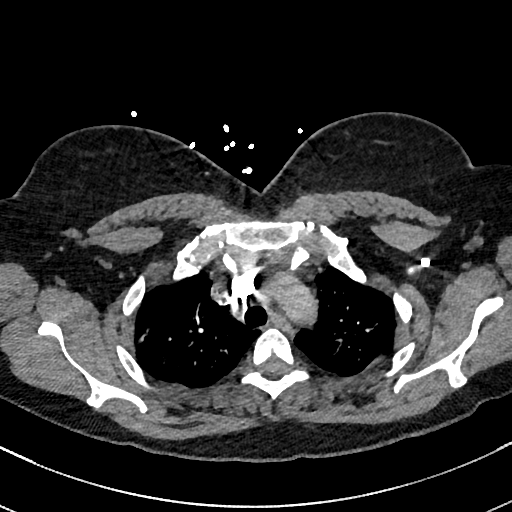
[im 396/489  lung]
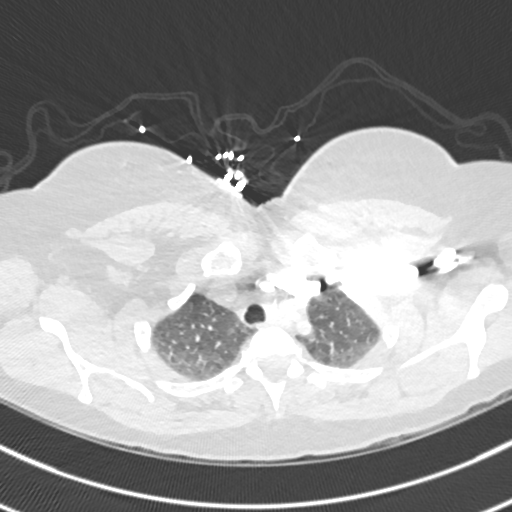
[im 419/489  mediastinal]
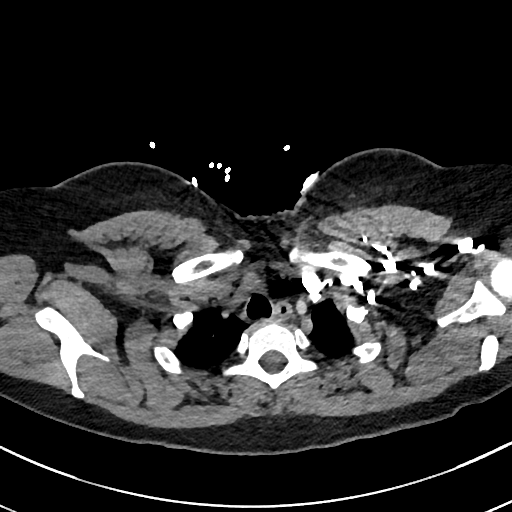
[im 442/489  lung]
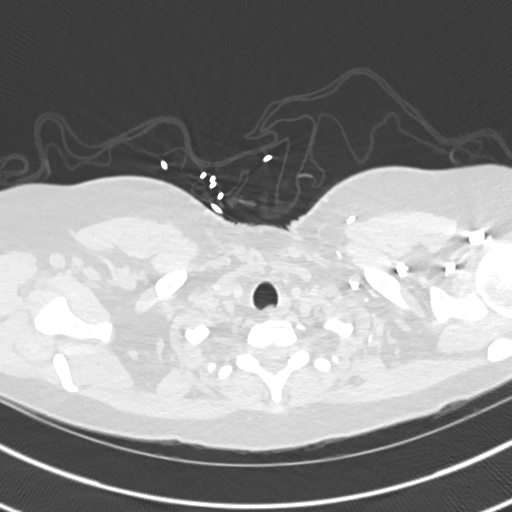
[im 465/489  mediastinal]
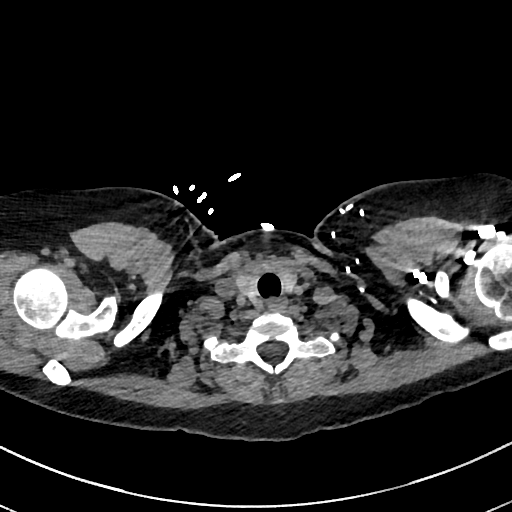

[Series 7: pe 2mm cor · coronal · 0.49mm/px · 1 of 107 slices shown]
[im 54/107  mediastinal]
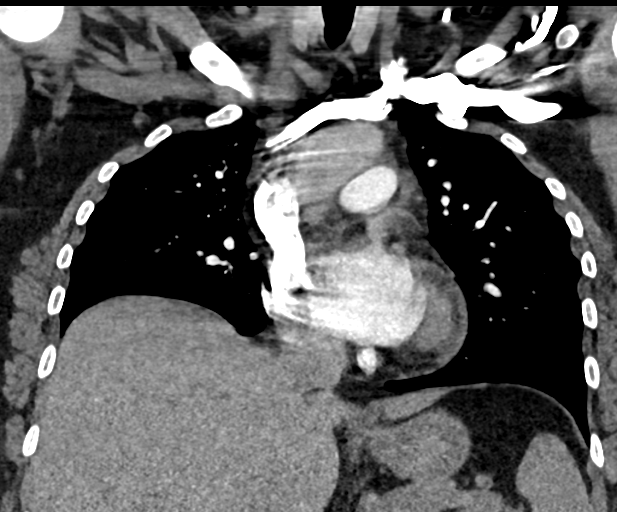

[19 of 36 positions shown; findings below may reference images not displayed]

FINDINGS: Technically adequate study with moderately good opacification of the
central and proximal segmental pulmonary arteries. No discrete
filling defects demonstrated, suggesting no evidence of significant
central pulmonary embolus. More peripheral vessels are poorly
visualized.

Normal heart size. Normal caliber thoracic aorta. Esophagus is
decompressed. No significant lymphadenopathy in the chest.

Evaluation of lungs is limited due to respiratory motion artifact.
There is dependent atelectasis in the posterior lungs with linear
atelectasis or focal consolidation in the right upper lung.
Scattered emphysematous changes in the lungs. No pleural effusions.
No pneumothorax.

Included portions of the upper abdominal organs are grossly
unremarkable. Mild degenerative changes in the spine. Focal
sclerosis demonstrated in the T4 vertebra probably benign. No
destructive bone lesions.

Review of the MIP images confirms the above findings.
IMPRESSION: No evidence of significant central pulmonary embolus. Linear
atelectasis or consolidation in the right upper lung. Dependent
changes in the posterior lungs. Scattered emphysematous changes.

## 2017-03-22 ENCOUNTER — Ambulatory Visit: Payer: Self-pay

## 2017-03-23 MED FILL — ?ATORVASTATIN 20 MG TABLET: 20 | 30 days supply | Qty: 30 | Fill #2

## 2017-03-23 MED FILL — HYDROCHLOROTHIAZIDE 25 MG T: 25 | 30 days supply | Qty: 30 | Fill #2

## 2017-04-24 MED FILL — HYDROCHLOROTHIAZIDE 25 MG T: 25 | 30 days supply | Qty: 30 | Fill #3

## 2017-04-24 MED FILL — ATORVASTATIN 20 MG TABLET: 20 | 30 days supply | Qty: 30 | Fill #3

## 2017-05-02 ENCOUNTER — Ambulatory Visit: Payer: Self-pay | Attending: Family Medicine

## 2017-05-19 ENCOUNTER — Ambulatory Visit: Payer: Self-pay | Attending: Family Medicine | Admitting: Family Medicine

## 2017-05-19 ENCOUNTER — Encounter: Payer: Self-pay | Admitting: Family Medicine

## 2017-05-19 VITALS — BP 115/78 | HR 74 | Temp 98.3°F | Ht 65.0 in | Wt 173.4 lb

## 2017-05-19 DIAGNOSIS — Z79899 Other long term (current) drug therapy: Secondary | ICD-10-CM | POA: Insufficient documentation

## 2017-05-19 DIAGNOSIS — Z636 Dependent relative needing care at home: Secondary | ICD-10-CM

## 2017-05-19 DIAGNOSIS — Z720 Tobacco use: Secondary | ICD-10-CM

## 2017-05-19 DIAGNOSIS — G8929 Other chronic pain: Secondary | ICD-10-CM

## 2017-05-19 DIAGNOSIS — M5442 Lumbago with sciatica, left side: Secondary | ICD-10-CM

## 2017-05-19 DIAGNOSIS — Z888 Allergy status to other drugs, medicaments and biological substances status: Secondary | ICD-10-CM | POA: Insufficient documentation

## 2017-05-19 DIAGNOSIS — Z880 Allergy status to penicillin: Secondary | ICD-10-CM | POA: Insufficient documentation

## 2017-05-19 DIAGNOSIS — F17210 Nicotine dependence, cigarettes, uncomplicated: Secondary | ICD-10-CM | POA: Insufficient documentation

## 2017-05-19 DIAGNOSIS — R232 Flushing: Secondary | ICD-10-CM

## 2017-05-19 DIAGNOSIS — I10 Essential (primary) hypertension: Secondary | ICD-10-CM

## 2017-05-19 DIAGNOSIS — F419 Anxiety disorder, unspecified: Secondary | ICD-10-CM

## 2017-05-19 DIAGNOSIS — E7849 Other hyperlipidemia: Secondary | ICD-10-CM

## 2017-05-19 DIAGNOSIS — K581 Irritable bowel syndrome with constipation: Secondary | ICD-10-CM

## 2017-05-19 DIAGNOSIS — F329 Major depressive disorder, single episode, unspecified: Secondary | ICD-10-CM | POA: Insufficient documentation

## 2017-05-19 DIAGNOSIS — M5416 Radiculopathy, lumbar region: Secondary | ICD-10-CM | POA: Insufficient documentation

## 2017-05-19 DIAGNOSIS — K589 Irritable bowel syndrome without diarrhea: Secondary | ICD-10-CM | POA: Insufficient documentation

## 2017-05-19 DIAGNOSIS — N951 Menopausal and female climacteric states: Secondary | ICD-10-CM | POA: Insufficient documentation

## 2017-05-19 DIAGNOSIS — Z882 Allergy status to sulfonamides status: Secondary | ICD-10-CM | POA: Insufficient documentation

## 2017-05-19 MED ORDER — ATORVASTATIN CALCIUM 20 MG PO TABS: 20.0000 mg | ORAL_TABLET | Freq: Every day | ORAL | 6 refills | Status: DC

## 2017-05-19 MED ORDER — CLONIDINE HCL 0.1 MG PO TABS: 0.1000 mg | ORAL_TABLET | Freq: Every day | ORAL | 3 refills | Status: DC

## 2017-05-19 MED ORDER — METHOCARBAMOL 750 MG PO TABS: 750.0000 mg | ORAL_TABLET | Freq: Three times a day (TID) | ORAL | 1 refills | Status: DC | PRN

## 2017-05-19 MED ORDER — HYDROCHLOROTHIAZIDE 25 MG PO TABS: 25.0000 mg | ORAL_TABLET | Freq: Every day | ORAL | 6 refills | Status: DC

## 2017-05-19 MED ORDER — NICOTINE 14 MG/24HR TD PT24: 14.0000 mg | MEDICATED_PATCH | Freq: Every day | TRANSDERMAL | 1 refills | Status: DC

## 2017-05-19 MED ORDER — CLINDAMYCIN PHOSPHATE 1 % EX SOLN: Freq: Two times a day (BID) | CUTANEOUS | 1 refills | Status: DC

## 2017-05-19 MED ORDER — LACTULOSE 10 GM/15ML PO SOLN: 10.0000 g | Freq: Three times a day (TID) | ORAL | 1 refills | Status: DC

## 2017-05-19 MED FILL — LACTULOSE Solution 10g/15ml: 10 | 21 days supply | Qty: 946 | Fill #0

## 2017-05-19 MED FILL — CLINDAMYCIN PH 1% SOLUTION: 1 | 15 days supply | Qty: 30 | Fill #0

## 2017-05-19 MED FILL — HYDROCHLOROTHIAZIDE 25 MG T: 25 | 30 days supply | Qty: 30 | Fill #0

## 2017-05-19 MED FILL — ATORVASTATIN 20 MG TABLET: 20 | 30 days supply | Qty: 30 | Fill #0

## 2017-05-19 MED FILL — cloNIDine HCL 0.1 MG TABS: 0.1 | 30 days supply | Qty: 30 | Fill #0

## 2017-05-19 MED FILL — METHOCARBAMOL 750 MG TABS: 750 | 30 days supply | Qty: 90 | Fill #0

## 2017-05-19 MED FILL — NICOTINE 14 MG/24HR PATCH: 14 | 28 days supply | Qty: 28 | Fill #0

## 2017-05-19 NOTE — Progress Notes (Signed)
Subjective:  Patient ID: Stacy Barrett, female    DOB: 1966-01-27  Age: 52 y.o. MRN: [id number]  CC: Irritable Bowel Syndrome   HPI Stacy Barrett is a 52 year old female with a history of hypertension, hyperlipidemia, depression who presents today for a follow-up visit.  She has been under a lot of stress lately from caring for her mother with brain injury and dementia as she is the only caregiver and her mom is in the process of being declared incompetent and will be appointed a Guardian.  This has her irritable bowel syndrome acting up and presenting more with constipation which she has not had in several years and has had to use enemas to move her bowels with last bowel movement this morning but it was very little.  She denies nausea, vomiting. She complains of hot flashes which are chronic every night and sometimes during the day with irregular periods  which occur every 3-4 months. Also requests help in quitting smoking; states she did not do well on Wellbutrin in the past.  Currently smokes half to 1 full pack a day.  She has been compliant with her antihypertensive and a low-sodium diet. She takes statin with no complaints of side effects. Does not exercise regularly. Anxiety and Depression is managed at [facility] and she has been compliant with all her psychotropic medications and undergoes therapy sessions every 4 weeks.  Denies suicidal ideations or intents.   Past Medical History:  Diagnosis Date  . Anxiety   . Depression   . Edema   . Hypertension   . Lumbar radiculopathy   . Pituitary adenoma ([place])   . Vertebral fracture     No past surgical history on file.  Allergies  Allergen Reactions  . Other Other (See Comments)    MSG -Very intense headaches  . Penicillins Hives  . Prednisone Itching, Swelling and Rash    Swelling of lips and face  . Sulfa Antibiotics Hives and Itching     Outpatient Medications Prior to Visit  Medication Sig Dispense Refill  .  clonazePAM (KLONOPIN) 0.5 MG tablet Take 0.5 mg by mouth at bedtime.    . gabapentin (NEURONTIN) 100 MG capsule Take 200 mg by mouth 2 (two) times daily.     . sertraline (ZOLOFT) 100 MG tablet Take 2 tablets (200 mg total) by mouth daily. For depression 60 tablet 0  . traZODone (DESYREL) 50 MG tablet Take 50 mg by mouth at bedtime.    Marland Kitchen atorvastatin (LIPITOR) 20 MG tablet Take 1 tablet (20 mg total) by mouth daily. 30 tablet 5  . clindamycin (CLEOCIN T) 1 % external solution Apply topically 2 (two) times daily. 30 mL 0  . hydrochlorothiazide (HYDRODIURIL) 25 MG tablet TAKE 1 TABLET BY MOUTH DAILY 30 tablet 5  . methocarbamol (ROBAXIN-750) 750 MG tablet Take 1 tablet (750 mg total) by mouth every 8 (eight) hours as needed for muscle spasms. 90 tablet 1  . busPIRone (BUSPAR) 10 MG tablet Take 10 mg by mouth 2 (two) times daily.    Marland Kitchen omega-3 acid ethyl esters (LOVAZA) 1 g capsule Take 1 g by mouth 2 (two) times daily.    Marland Kitchen omeprazole (PRILOSEC) 10 MG capsule Take 10 mg by mouth daily.    . traMADol (ULTRAM) 50 MG tablet Take 1 tablet (50 mg total) by mouth every 12 (twelve) hours as needed. (Patient not taking: Reported on 05/19/2017) 40 tablet 1   No facility-administered medications prior to visit.  ROS Review of Systems  Constitutional: Negative for activity change, appetite change and fatigue.  HENT: Negative for congestion, sinus pressure and sore throat.   Eyes: Negative for visual disturbance.  Respiratory: Negative for cough, chest tightness, shortness of breath and wheezing.   Cardiovascular: Negative for chest pain and palpitations.  Gastrointestinal: Positive for constipation. Negative for abdominal distention and abdominal pain.  Endocrine: Negative for polydipsia.  Genitourinary: Negative for dysuria and frequency.  Musculoskeletal: Negative for arthralgias and back pain.  Skin: Negative for rash.  Neurological: Negative for tremors, light-headedness and numbness.    Hematological: Does not bruise/bleed easily.  Psychiatric/Behavioral: Negative for agitation and behavioral problems.    Objective:  BP 115/78   Pulse 74   Temp 98.3 F (36.8 C) (Oral)   Ht _0  (1.651 m)   Wt 173 lb 6.4 oz (78.7 kg)   SpO2 98%   BMI 28.86 kg/m   BP/Weight 05/19/2017 3/50/0938 02/22/2991  Systolic BP 716 967 893  Diastolic BP 78 81 78  Wt. (Lbs) 173.4 185.2 200.6  BMI 28.86 30.82 33.38  Some encounter information is confidential and restricted. Go to Review Flowsheets activity to see all data.      Physical Exam  Constitutional: She is oriented to person, place, and time. She appears well-developed and well-nourished.  Cardiovascular: Normal rate, normal heart sounds and intact distal pulses.  No murmur heard. Pulmonary/Chest: Effort normal and breath sounds normal. She has no wheezes. She has no rales. She exhibits no tenderness.  Abdominal: Soft. Bowel sounds are normal. She exhibits no distension and no mass. There is no tenderness.  Musculoskeletal: Normal range of motion.  Neurological: She is alert and oriented to person, place, and time.  Skin: Skin is warm and dry.  Psychiatric: She has a normal mood and affect.     CMP Latest Ref Rng & Units 06/14/2016 10/28/2015 12/18/2014  Glucose 65 - 99 mg/dL 94 93 88  BUN 6 - 24 mg/dL _1 Creatinine 0.57 - 1.00 mg/dL 0.72 0.64 0.71  Sodium 134 - 144 mmol/L 143 139 135  Potassium 3.5 - 5.2 mmol/L 4.2 3.8 3.3(L)  Chloride 96 - 106 mmol/L 102 109 100(L)  CO2 18 - 29 mmol/L 26 24 21(L)  Calcium 8.7 - 10.2 mg/dL 10.2 9.7 10.7(H)  Total Protein 6.0 - 8.5 g/dL 6.3 6.5 -  Total Bilirubin 0.0 - 1.2 mg/dL 0.3 0.6 -  Alkaline Phos 39 - 117 IU/L 85 62 -  AST 0 - 40 IU/L 14 14 -  ALT 0 - 32 IU/L 12 13 -    Lipid Panel     Component Value Date/Time   CHOL 148 06/14/2016 1029   TRIG 118 06/14/2016 1029   HDL 38 (L) 06/14/2016 1029   CHOLHDL 3.9 06/14/2016 1029   CHOLHDL 8.0 (H) 10/28/2015 1104   VLDL 60  (H) 10/28/2015 1104   LDLCALC 86 06/14/2016 1029    Assessment & Plan:   1. Other hyperlipidemia Controlled Low-cholesterol diet - atorvastatin (LIPITOR) 20 MG tablet; Take 1 tablet (20 mg total) by mouth daily.  Dispense: 30 tablet; Refill: 6  2. Essential hypertension Controlled Low-cholesterol diet - hydrochlorothiazide (HYDRODIURIL) 25 MG tablet; Take 1 tablet (25 mg total) by mouth daily.  Dispense: 30 tablet; Refill: 6 - CMP14+EGFR; Future - Lipid panel; Future  3. Chronic midline low back pain with left-sided sciatica Stable - methocarbamol (ROBAXIN-750) 750 MG tablet; Take 1 tablet (750 mg total) by mouth every 8 (eight)  hours as needed for muscle spasms.  Dispense: 90 tablet; Refill: 1  4. Anxiety Uncontrolled, exacerbated by caregiver stress Currently on Zoloft prescribed at [place] therapy sessions at [facility]  5. Caregiver stress We have discussed coping mechanisms  6. Hot flashes - cloNIDine (CATAPRES) 0.1 MG tablet; Take 1 tablet (0.1 mg total) by mouth at bedtime. For Hot flashes  Dispense: 30 tablet; Refill: 3  7. Irritable bowel syndrome with constipation We will try lactulose and if symptoms are uncontrolled we will place her on Linzess - lactulose (CHRONULAC) 10 GM/15ML solution; Take 15 mLs (10 g total) by mouth 3 (three) times daily.  Dispense: 946 mL; Refill: 1  8. Tobacco abuse - nicotine (NICODERM CQ) 14 mg/24hr patch; Place 1 patch (14 mg total) onto the skin daily. Then 50m patch daily for the next 1 month  Dispense: 28 patch; Refill: 1   Meds ordered this encounter  Medications  . nicotine (NICODERM CQ) 14 mg/24hr patch    Sig: Place 1 patch (14 mg total) onto the skin daily. Then 719mpatch daily for the next 1 month    Dispense:  28 patch    Refill:  1  . atorvastatin (LIPITOR) 20 MG tablet    Sig: Take 1 tablet (20 mg total) by mouth daily.    Dispense:  30 tablet    Refill:  6  . hydrochlorothiazide (HYDRODIURIL) 25 MG tablet     Sig: Take 1 tablet (25 mg total) by mouth daily.    Dispense:  30 tablet    Refill:  6  . methocarbamol (ROBAXIN-750) 750 MG tablet    Sig: Take 1 tablet (750 mg total) by mouth every 8 (eight) hours as needed for muscle spasms.    Dispense:  90 tablet    Refill:  1  . clindamycin (CLEOCIN T) 1 % external solution    Sig: Apply topically 2 (two) times daily.    Dispense:  30 mL    Refill:  1  . lactulose (CHRONULAC) 10 GM/15ML solution    Sig: Take 15 mLs (10 g total) by mouth 3 (three) times daily.    Dispense:  946 mL    Refill:  1  . cloNIDine (CATAPRES) 0.1 MG tablet    Sig: Take 1 tablet (0.1 mg total) by mouth at bedtime. For Hot flashes    Dispense:  30 tablet    Refill:  3    Follow-up: No follow-ups on file.   EnCharlott RakesD

## 2017-05-19 NOTE — Progress Notes (Signed)
Patient is having hot flashes. Patient would like to quit smoking. Patient under a lot of stress.

## 2017-05-22 ENCOUNTER — Ambulatory Visit: Payer: Self-pay | Attending: Family Medicine

## 2017-05-22 DIAGNOSIS — I10 Essential (primary) hypertension: Secondary | ICD-10-CM | POA: Insufficient documentation

## 2017-05-22 NOTE — Progress Notes (Signed)
Patient here for lab visit only 

## 2017-05-23 ENCOUNTER — Telehealth: Payer: Self-pay

## 2017-05-23 LAB — CMP14+EGFR
A/G RATIO: 2.3 — AB (ref 1.2–2.2)
ALT: 15 IU/L (ref 0–32)
AST: 10 IU/L (ref 0–40)
Albumin: 4.3 g/dL (ref 3.5–5.5)
Alkaline Phosphatase: 69 IU/L (ref 39–117)
BUN/Creatinine Ratio: 16 (ref 9–23)
BUN: 12 mg/dL (ref 6–24)
Bilirubin Total: 0.8 mg/dL (ref 0.0–1.2)
CALCIUM: 10.2 mg/dL (ref 8.7–10.2)
CO2: 23 mmol/L (ref 20–29)
Chloride: 106 mmol/L (ref 96–106)
Creatinine, Ser: 0.76 mg/dL (ref 0.57–1.00)
GFR calc Af Amer: 104 mL/min/{1.73_m2} (ref >59)
GFR, EST NON AFRICAN AMERICAN: 90 mL/min/{1.73_m2} (ref >59)
GLOBULIN, TOTAL: 1.9 g/dL (ref 1.5–4.5)
Glucose: 91 mg/dL (ref 65–99)
POTASSIUM: 4.5 mmol/L (ref 3.5–5.2)
SODIUM: 145 mmol/L — AB (ref 134–144)
TOTAL PROTEIN: 6.2 g/dL (ref 6.0–8.5)

## 2017-05-23 LAB — LIPID PANEL
CHOLESTEROL TOTAL: 155 mg/dL (ref 100–199)
Chol/HDL Ratio: 3.3 ratio (ref 0.0–4.4)
HDL: 47 mg/dL (ref >39)
LDL Calculated: 83 mg/dL (ref 0–99)
TRIGLYCERIDES: 124 mg/dL (ref 0–149)
VLDL Cholesterol Cal: 25 mg/dL (ref 5–40)

## 2017-05-23 NOTE — Telephone Encounter (Signed)
Patient was called and informed of lab results. 

## 2017-06-26 MED FILL — HYDROCHLOROTHIAZIDE 25 MG T: 25 | 30 days supply | Qty: 30 | Fill #1

## 2017-06-26 MED FILL — cloNIDine HCL 0.1 MG TABS: 0.1 | 30 days supply | Qty: 30 | Fill #1

## 2017-06-26 MED FILL — ATORVASTATIN 20 MG TABLET: 20 | 30 days supply | Qty: 30 | Fill #1

## 2017-08-02 MED FILL — ?ATORVASTATIN 20 MG TABLET: 20 | 30 days supply | Qty: 30 | Fill #2

## 2017-08-02 MED FILL — HYDROCHLOROTHIAZIDE 25 MG T: 25 | 30 days supply | Qty: 30 | Fill #2

## 2017-08-18 ENCOUNTER — Ambulatory Visit: Payer: Self-pay | Admitting: Family Medicine

## 2017-08-25 ENCOUNTER — Ambulatory Visit: Payer: Self-pay | Admitting: Family Medicine

## 2017-08-31 ENCOUNTER — Other Ambulatory Visit: Payer: Self-pay | Admitting: Family Medicine

## 2017-08-31 DIAGNOSIS — G8929 Other chronic pain: Secondary | ICD-10-CM

## 2017-08-31 DIAGNOSIS — M5442 Lumbago with sciatica, left side: Principal | ICD-10-CM

## 2017-08-31 MED FILL — ?ATORVASTATIN 20 MG TABLET: 20 | 30 days supply | Qty: 30 | Fill #3

## 2017-08-31 MED FILL — HYDROCHLOROTHIAZIDE 25 MG T: 25 | 30 days supply | Qty: 30 | Fill #3

## 2017-10-04 MED FILL — HYDROCHLOROTHIAZIDE 25 MG T: 25 | 30 days supply | Qty: 30 | Fill #4

## 2017-10-04 MED FILL — ?ATORVASTATIN 20 MG TABLET: 20 | 30 days supply | Qty: 30 | Fill #4

## 2017-10-31 MED FILL — NICOTINE 14 MG/24HR PATCH: 14 | 28 days supply | Qty: 28 | Fill #1

## 2017-11-13 MED FILL — ATORVASTATIN 20 MG TABLET: 20 | 30 days supply | Qty: 30 | Fill #5

## 2017-11-13 MED FILL — HYDROCHLOROTHIAZIDE 25 MG T: 25 | 30 days supply | Qty: 30 | Fill #5

## 2017-11-21 ENCOUNTER — Other Ambulatory Visit: Payer: Self-pay

## 2017-11-21 ENCOUNTER — Telehealth: Payer: Self-pay | Admitting: Family Medicine

## 2017-11-21 ENCOUNTER — Ambulatory Visit (HOSPITAL_COMMUNITY)
Admission: EM | Admit: 2017-11-21 | Discharge: 2017-11-21 | Disposition: A | Discharge status: Home / Self Care | Payer: Self-pay | Attending: Family Medicine | Admitting: Family Medicine

## 2017-11-21 ENCOUNTER — Encounter (HOSPITAL_COMMUNITY): Payer: Self-pay | Admitting: Emergency Medicine

## 2017-11-21 DIAGNOSIS — R319 Hematuria, unspecified: Secondary | ICD-10-CM

## 2017-11-21 DIAGNOSIS — N390 Urinary tract infection, site not specified: Secondary | ICD-10-CM

## 2017-11-21 LAB — POCT URINALYSIS DIP (DEVICE)
BILIRUBIN URINE: NEGATIVE
Glucose, UA: NEGATIVE mg/dL
Ketones, ur: NEGATIVE mg/dL
LEUKOCYTES UA: NEGATIVE
Nitrite: NEGATIVE
PH: 6.5 (ref 5.0–8.0)
Protein, ur: NEGATIVE mg/dL
Specific Gravity, Urine: 1.015 (ref 1.005–1.030)
UROBILINOGEN UA: 0.2 mg/dL (ref 0.0–1.0)

## 2017-11-21 LAB — POCT PREGNANCY, URINE: PREG TEST UR: NEGATIVE

## 2017-11-21 MED ORDER — NITROFURANTOIN MONOHYD MACRO 100 MG PO CAPS: 100.0000 mg | ORAL_CAPSULE | Freq: Two times a day (BID) | ORAL | 0 refills | Status: DC

## 2017-11-21 NOTE — Discharge Instructions (Signed)
Urine showed blood in urine.  This could be due to urinary tract infection Urine culture sent.  We will call you with the results.   Push fluids and get plenty of rest.   Take antibiotic as directed and to completion Follow up with PCP when symptoms resolve, to have urine rechecked Return here or go to ER if you have any new or worsening symptoms such as fever, worsening abdominal pain, nausea/vomiting, flank pain, etc..Marland Kitchen

## 2017-11-21 NOTE — Telephone Encounter (Signed)
Patient called because she believes she has an infection. Patient would like to come in and give a urine sample to be tested and/or get advice on what she needs to do. Please follow up with patient.

## 2017-11-21 NOTE — ED Triage Notes (Signed)
uti for 3 days.  Patient feels the need to urinate all the time, pressure

## 2017-11-21 NOTE — ED Provider Notes (Signed)
[facility]   CC: UTI symptoms  SUBJECTIVE:  Stacy Barrett is a 52 y.o. female who complains of urinary frequency for the past 3 days.  Patient denies a precipitating event, recent sexual encounter, excessive caffeine intake.  Complains of associated lower abdominal pressure.  Pain is intermittent and worse with urination. Has tried OTC medications like ibuprofen and tylenol without relief.  Admits to similar symptoms in the past.  Denies fever, chills, nausea, vomiting, abdominal pain, flank pain, abnormal vaginal discharge or bleeding, hematuria.    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  Father with recent cancer diagnosis.  Has appointment with psychiatrist this week.    LMP: No LMP recorded.  ROS: As in HPI.  Past Medical History:  Diagnosis Date  . Anxiety   . Depression   . Edema   . Hypertension   . Lumbar radiculopathy   . Pituitary adenoma ([place])   . Vertebral fracture    History reviewed. No pertinent surgical history. Allergies  Allergen Reactions  . Other Other (See Comments)    MSG -Very intense headaches  . Penicillins Hives  . Prednisone Itching, Swelling and Rash    Swelling of lips and face  . Sulfa Antibiotics Hives and Itching   No current facility-administered medications on file prior to encounter.    Current Outpatient Medications on File Prior to Encounter  Medication Sig Dispense Refill  . sertraline (ZOLOFT) 100 MG tablet Take 2 tablets (200 mg total) by mouth daily. For depression 60 tablet 0  . atorvastatin (LIPITOR) 20 MG tablet Take 1 tablet (20 mg total) by mouth daily. 30 tablet 6  . clonazePAM (KLONOPIN) 0.5 MG tablet Take 0.5 mg by mouth at bedtime.    . gabapentin (NEURONTIN) 100 MG capsule Take 200 mg by mouth 2 (two) times daily.     . hydrochlorothiazide (HYDRODIURIL) 25 MG tablet Take 1 tablet (25 mg total) by mouth daily. 30 tablet 6  . lactulose (CHRONULAC) 10 GM/15ML solution Take 15 mLs  (10 g total) by mouth 3 (three) times daily. 946 mL 1  . nicotine (NICODERM CQ) 14 mg/24hr patch Place 1 patch (14 mg total) onto the skin daily. Then 7mg  patch daily for the next 1 month 28 patch 1  . omega-3 acid ethyl esters (LOVAZA) 1 g capsule Take 1 g by mouth 2 (two) times daily.    Marland Kitchen omeprazole (PRILOSEC) 10 MG capsule Take 10 mg by mouth daily.    . traMADol (ULTRAM) 50 MG tablet TAKE 1 TABLET BY MOUTH EVERY 12 HOURS AS NEEDED 40 tablet 1  . traZODone (DESYREL) 50 MG tablet Take 50 mg by mouth at bedtime.     Social History   Socioeconomic History  . Marital status: Unknown    Spouse name: Not on file  . Number of children: Not on file  . Years of education: Not on file  . Highest education level: Not on file  Occupational History  . Not on file  Social Needs  . Financial resource strain: Not on file  . Food insecurity:    Worry: Not on file    Inability: Not on file  . Transportation needs:    Medical: Not on file    Non-medical: Not on file  Tobacco Use  . Smoking status: Current Every Day Smoker    Packs/day: 0.25    Types: Cigarettes  . Smokeless tobacco: Never Used  . Tobacco comment: 7 cigs daily  Substance and Sexual Activity  . Alcohol use: Yes    Alcohol/week: 1.0 standard drinks    Types: 1 Glasses of wine per week    Comment: "hardly ever"  . Drug use: Yes    Types: Marijuana  . Sexual activity: Not on file  Lifestyle  . Physical activity:    Days per week: Not on file    Minutes per session: Not on file  . Stress: Not on file  Relationships  . Social connections:    Talks on phone: Not on file    Gets together: Not on file    Attends religious service: Not on file    Active member of club or organization: Not on file    Attends meetings of clubs or organizations: Not on file    Relationship status: Not on file  . Intimate partner violence:    Fear of current or ex partner: Not on file    Emotionally abused: Not on file    Physically abused:  Not on file    Forced sexual activity: Not on file  Other Topics Concern  . Not on file  Social History Narrative  . Not on file   Family History  Problem Relation Age of Onset  . CAD Mother     OBJECTIVE:  Vitals:   11/21/17 1621 11/21/17 1626  BP: 136/89 139/86  Pulse: 66   Resp: 18   Temp: (!) 97.5 F (36.4 C)   TempSrc: Oral   SpO2: 100%    General appearance: AOx3 in no acute distress HEENT: NCAT.  Oropharynx clear.  Lungs: clear to auscultation bilaterally without adventitious breath sounds Heart: regular rate and rhythm.  Radial pulses 2+ symmetrical bilaterally Abdomen: soft; non-distended; no tenderness; bowel sounds present;  no guarding Back: no CVA tenderness Extremities: no edema; symmetrical with no gross deformities Skin: warm and dry Neurologic: Ambulates from chair to exam table without difficulty Psychological: alert and cooperative; depressed mood and affect, tearful during exam  Labs Reviewed  POCT URINALYSIS DIP (DEVICE) - Abnormal; Notable for the following components:      Result Value   Hgb urine dipstick MODERATE (*)    All other components within normal limits  POCT PREGNANCY, URINE    ASSESSMENT & PLAN:  1. Urinary tract infection with hematuria, site unspecified     Meds ordered this encounter  Medications  . nitrofurantoin, macrocrystal-monohydrate, (MACROBID) 100 MG capsule    Sig: Take 1 capsule (100 mg total) by mouth 2 (two) times daily.    Dispense:  10 capsule    Refill:  0    Order Specific Question:   Supervising Provider    Answer:   Wynona Luna [[id number]]   Urine showed blood in urine.  This could be due to urinary tract infection Urine culture sent.  We will call you with the results.   Push fluids and get plenty of rest.   Take antibiotic as directed and to completion Follow up with PCP when symptoms resolve, to have urine rechecked Return here or go to ER if you have any new or worsening symptoms such as  fever, worsening abdominal pain, nausea/vomiting, flank pain, etc...  Outlined signs and symptoms indicating need for more acute intervention. Patient verbalized understanding. After Visit Summary given.     Lestine Box, PA-C 11/21/17 1653

## 2017-11-22 NOTE — Telephone Encounter (Signed)
Schedule patient for a nurse visit or visit with any provider.

## 2017-11-22 NOTE — Telephone Encounter (Signed)
Patient is coming the oct 23

## 2017-12-06 ENCOUNTER — Ambulatory Visit: Payer: Self-pay | Attending: Family Medicine | Admitting: Physician Assistant

## 2017-12-06 VITALS — BP 131/87 | HR 73 | Temp 98.5°F | Resp 16 | Wt 164.4 lb

## 2017-12-06 DIAGNOSIS — Z882 Allergy status to sulfonamides status: Secondary | ICD-10-CM | POA: Insufficient documentation

## 2017-12-06 DIAGNOSIS — I10 Essential (primary) hypertension: Secondary | ICD-10-CM | POA: Insufficient documentation

## 2017-12-06 DIAGNOSIS — R5383 Other fatigue: Secondary | ICD-10-CM

## 2017-12-06 DIAGNOSIS — R1030 Lower abdominal pain, unspecified: Secondary | ICD-10-CM

## 2017-12-06 DIAGNOSIS — F3340 Major depressive disorder, recurrent, in remission, unspecified: Secondary | ICD-10-CM

## 2017-12-06 DIAGNOSIS — Z888 Allergy status to other drugs, medicaments and biological substances status: Secondary | ICD-10-CM | POA: Insufficient documentation

## 2017-12-06 DIAGNOSIS — Z09 Encounter for follow-up examination after completed treatment for conditions other than malignant neoplasm: Secondary | ICD-10-CM

## 2017-12-06 DIAGNOSIS — Z880 Allergy status to penicillin: Secondary | ICD-10-CM | POA: Insufficient documentation

## 2017-12-06 DIAGNOSIS — F419 Anxiety disorder, unspecified: Secondary | ICD-10-CM | POA: Insufficient documentation

## 2017-12-06 DIAGNOSIS — Z79899 Other long term (current) drug therapy: Secondary | ICD-10-CM | POA: Insufficient documentation

## 2017-12-06 MED ORDER — SERTRALINE HCL 100 MG PO TABS: 100.0000 mg | ORAL_TABLET | Freq: Every day | ORAL | 0 refills | Status: DC

## 2017-12-06 NOTE — Progress Notes (Signed)
Patient ID: Stacy Barrett, female   DOB: Jun 12, 1965, 52 y.o.   MRN: [id number]       Stacy Barrett, is a 52 y.o. female  [id number]  [id number]  DOB - [birth year]  Subjective:  Chief Complaint and HPI: [name] is a 52 y.o. female here today for a follow up visit Went to ED 11/21/2017 with s/sx of UTI.  Treated with macrobid.  UTI s/sx are better.   C/o mid-abdominal pain for about 2-3 weeks.  No f/c.  No N/V/D.  Not SA.  No vaginal discharge.    Today c/o generalized fatigue.  She has been having this for months.  Working with [employer] on medications   Also concerned about CA.  Recently found out her estranged father and 2 cousins have CA.  She is over due on colonoscopy and Mammogram.  She needs to re-apply for financial assistance.    Social: unemployed, living with her mom  From A/P: Urine showed blood in urine.  This could be due to urinary tract infection Urine culture sent.  We will call you with the results.   Push fluids and get plenty of rest.   Take antibiotic as directed and to completion Follow up with PCP when symptoms resolve, to have urine rechecked Return here or go to ER if you have any new or worsening symptoms such as fever, worsening abdominal pain, nausea/vomiting, flank pain, etc...  ED/Hospital notes reviewed.    ROS:   Constitutional:  No f/c, No night sweats, No unexplained weight loss. EENT:  No vision changes, No blurry vision, No hearing changes. No mouth, throat, or ear problems.  Respiratory: No cough, No SOB Cardiac: No CP, no palpitations GI:  No abd pain, No N/V/D. GU: No Urinary s/sx Musculoskeletal: No joint pain Neuro: No headache, no dizziness, no motor weakness.  Skin: No rash Endocrine:  No polydipsia. No polyuria.  Psych: Denies SI/HI  No problems updated.  ALLERGIES: Allergies  Allergen Reactions  . Other Other (See Comments)    MSG -Very intense headaches  . Penicillins Hives  . Prednisone Itching, Swelling and  Rash    Swelling of lips and face  . Sulfa Antibiotics Hives and Itching    PAST MEDICAL HISTORY: Past Medical History:  Diagnosis Date  . Anxiety   . Depression   . Edema   . Hypertension   . Lumbar radiculopathy   . Pituitary adenoma ([place])   . Vertebral fracture     MEDICATIONS AT HOME: Prior to Admission medications   Medication Sig Start Date End Date Taking? Authorizing Provider  atorvastatin (LIPITOR) 20 MG tablet Take 1 tablet (20 mg total) by mouth daily. 05/19/17   Charlott Rakes, MD  clonazePAM (KLONOPIN) 0.5 MG tablet Take 0.5 mg by mouth at bedtime.    [provider]  gabapentin (NEURONTIN) 100 MG capsule Take 200 mg by mouth 2 (two) times daily.     [provider]  hydrochlorothiazide (HYDRODIURIL) 25 MG tablet Take 1 tablet (25 mg total) by mouth daily. 05/19/17   Charlott Rakes, MD  nicotine (NICODERM CQ) 14 mg/24hr patch Place 1 patch (14 mg total) onto the skin daily. Then 7mg  patch daily for the next 1 month 05/19/17   Charlott Rakes, MD  omega-3 acid ethyl esters (LOVAZA) 1 g capsule Take 1 g by mouth 2 (two) times daily.    [provider]  omeprazole (PRILOSEC) 10 MG capsule Take 10 mg by mouth daily.    [provider]  sertraline (ZOLOFT) 100 MG tablet Take 1 tablet (100 mg total) by mouth daily. For depression 12/06/17   Argentina Donovan, PA-C  traMADol (ULTRAM) 50 MG tablet TAKE 1 TABLET BY MOUTH EVERY 12 HOURS AS NEEDED Patient not taking: Reported on 12/06/2017 09/04/17   Charlott Rakes, MD  traZODone (DESYREL) 50 MG tablet Take 50 mg by mouth at bedtime.    [provider]     Objective:  EXAM:   Vitals:   12/06/17 1342  BP: 131/87  Pulse: 73  Resp: 16  Temp: 98.5 F (36.9 C)  TempSrc: Oral  SpO2: 98%  Weight: 164 lb 6.4 oz (74.6 kg)    General appearance : A&OX3. NAD. Non-toxic-appearing HEENT: Atraumatic and Normocephalic.  PERRLA. EOM intact.  Neck: supple, no JVD. No cervical  lymphadenopathy. No thyromegaly Chest/Lungs:  Breathing-non-labored, Good air entry bilaterally, breath sounds normal without rales, rhonchi, or wheezing  CVS: S1 S2 regular, no murmurs, gallops, rubs  Abdomen: Bowel sounds present, Non tender and not distended with no gaurding, rigidity or rebound. Extremities: Bilateral Lower Ext shows no edema, both legs are warm to touch with = pulse throughout Neurology:  CN II-XII grossly intact, Non focal.   Psych:  TP linear. J/I WNL. Normal speech. Appropriate eye contact and affect.  Skin:  No Rash  Data Review Lab Results  Component Value Date   HGBA1C 5.7 (H) 10/08/2013     Assessment & Plan   1. Fatigue, unspecified type - TSH - CBC with Differential/Platelet - Vitamin D, 25-hydroxy  2. Major depressive disorder, recurrent, in remission ([place]) Stable-continue to follow with [facility] - sertraline (ZOLOFT) 100 MG tablet; Take 1 tablet (100 mg total) by mouth daily. For depression  Dispense: 60 tablet; Refill: 0  3. Encounter for examination following treatment at hospital improved  4. Lower abdominal pain Non-acute abdomen - Comprehensive metabolic panel  5> cancer concerns-needs to get UTD on MMG/colonoscopy-will apply for orange card again and schedule at next visit.    Patient have been counseled extensively about nutrition and exercise  Return in about 1 month (around 01/06/2018) for f/up Dr Margarita Rana for fatigue.  The patient was given clear instructions to go to ER or return to medical center if symptoms don't improve, worsen or new problems develop. The patient verbalized understanding. The patient was told to call to get lab results if they haven't heard anything in the next week.     Freeman Caldron, PA-C [facility] [place], [place]   12/06/2017, 2:02 PM

## 2017-12-07 ENCOUNTER — Other Ambulatory Visit: Payer: Self-pay | Admitting: Physician Assistant

## 2017-12-07 DIAGNOSIS — E559 Vitamin D deficiency, unspecified: Secondary | ICD-10-CM

## 2017-12-07 LAB — COMPREHENSIVE METABOLIC PANEL
ALBUMIN: 4.6 g/dL (ref 3.5–5.5)
ALT: 21 IU/L (ref 0–32)
AST: 15 IU/L (ref 0–40)
Albumin/Globulin Ratio: 1.8 (ref 1.2–2.2)
Alkaline Phosphatase: 80 IU/L (ref 39–117)
BUN/Creatinine Ratio: 21 (ref 9–23)
BUN: 16 mg/dL (ref 6–24)
Bilirubin Total: 0.8 mg/dL (ref 0.0–1.2)
CALCIUM: 11.3 mg/dL — AB (ref 8.7–10.2)
CO2: 27 mmol/L (ref 20–29)
Chloride: 101 mmol/L (ref 96–106)
Creatinine, Ser: 0.76 mg/dL (ref 0.57–1.00)
GFR calc Af Amer: 104 mL/min/{1.73_m2} (ref >59)
GFR, EST NON AFRICAN AMERICAN: 90 mL/min/{1.73_m2} (ref >59)
GLOBULIN, TOTAL: 2.5 g/dL (ref 1.5–4.5)
GLUCOSE: 78 mg/dL (ref 65–99)
Potassium: 4.7 mmol/L (ref 3.5–5.2)
Sodium: 142 mmol/L (ref 134–144)
TOTAL PROTEIN: 7.1 g/dL (ref 6.0–8.5)

## 2017-12-07 LAB — CBC WITH DIFFERENTIAL/PLATELET
BASOS: 1 %
Basophils Absolute: 0.1 10*3/uL (ref 0.0–0.2)
EOS (ABSOLUTE): 0.1 10*3/uL (ref 0.0–0.4)
EOS: 1 %
Hematocrit: 47.5 % — ABNORMAL HIGH (ref 34.0–46.6)
Hemoglobin: 16.2 g/dL — ABNORMAL HIGH (ref 11.1–15.9)
IMMATURE GRANS (ABS): 0 10*3/uL (ref 0.0–0.1)
IMMATURE GRANULOCYTES: 0 %
LYMPHS: 22 %
Lymphocytes Absolute: 1.8 10*3/uL (ref 0.7–3.1)
MCH: 30.1 pg (ref 26.6–33.0)
MCHC: 34.1 g/dL (ref 31.5–35.7)
MCV: 88 fL (ref 79–97)
MONOS ABS: 0.5 10*3/uL (ref 0.1–0.9)
Monocytes: 6 %
NEUTROS PCT: 70 %
Neutrophils Absolute: 5.7 10*3/uL (ref 1.4–7.0)
PLATELETS: 218 10*3/uL (ref 150–450)
RBC: 5.38 x10E6/uL — AB (ref 3.77–5.28)
RDW: 12.2 % — AB (ref 12.3–15.4)
WBC: 8.1 10*3/uL (ref 3.4–10.8)

## 2017-12-07 LAB — VITAMIN D 25 HYDROXY (VIT D DEFICIENCY, FRACTURES): Vit D, 25-Hydroxy: 17.9 ng/mL — ABNORMAL LOW (ref 30.0–100.0)

## 2017-12-07 LAB — TSH: TSH: 1.13 u[IU]/mL (ref 0.450–4.500)

## 2017-12-07 MED ORDER — VITAMIN D (ERGOCALCIFEROL) 1.25 MG (50000 UNIT) PO CAPS: 50000.0000 [IU] | ORAL_CAPSULE | ORAL | 0 refills | Status: DC

## 2017-12-08 ENCOUNTER — Telehealth: Payer: Self-pay

## 2017-12-08 NOTE — Telephone Encounter (Signed)
Contacted pt to go over lab results pt is aware and doesn't have any questions or concerns 

## 2017-12-18 MED FILL — ATORVASTATIN 20 MG TABLET: 20 | 30 days supply | Qty: 30 | Fill #6

## 2017-12-18 MED FILL — HYDROCHLOROTHIAZIDE 25 MG T: 25 | 30 days supply | Qty: 30 | Fill #6

## 2017-12-25 MED FILL — VIT D2 1.25 MG (50,000 UNIT: 1.25 MG | 28 days supply | Qty: 4 | Fill #0

## 2018-01-08 ENCOUNTER — Ambulatory Visit: Payer: Self-pay | Admitting: Family Medicine

## 2018-01-17 ENCOUNTER — Ambulatory Visit: Payer: Self-pay | Admitting: Family Medicine

## 2018-01-17 ENCOUNTER — Ambulatory Visit: Payer: Self-pay | Attending: Family Medicine

## 2018-01-18 ENCOUNTER — Ambulatory Visit: Payer: Self-pay

## 2018-01-30 ENCOUNTER — Other Ambulatory Visit: Payer: Self-pay | Admitting: Family Medicine

## 2018-01-30 DIAGNOSIS — I10 Essential (primary) hypertension: Secondary | ICD-10-CM

## 2018-01-30 DIAGNOSIS — E7849 Other hyperlipidemia: Secondary | ICD-10-CM

## 2018-01-30 MED FILL — HYDROCHLOROTHIAZIDE 25 MG T: 25 | 30 days supply | Qty: 30 | Fill #0

## 2018-01-30 MED FILL — ATORVASTATIN 20 MG TABLET: 20 | 30 days supply | Qty: 30 | Fill #0

## 2018-03-06 ENCOUNTER — Other Ambulatory Visit: Payer: Self-pay | Admitting: Family Medicine

## 2018-03-06 DIAGNOSIS — E7849 Other hyperlipidemia: Secondary | ICD-10-CM

## 2018-03-06 DIAGNOSIS — I10 Essential (primary) hypertension: Secondary | ICD-10-CM

## 2018-03-06 MED FILL — VIT D2 1.25 MG (50,000 UNIT: 1.25 MG | 28 days supply | Qty: 4 | Fill #1

## 2018-03-08 MED FILL — ?ATORVASTATIN 20 MG TABLET: 20 | 30 days supply | Qty: 30 | Fill #0

## 2018-03-08 MED FILL — HYDROCHLOROTHIAZIDE 25 MG T: 25 | 30 days supply | Qty: 30 | Fill #0

## 2018-04-05 ENCOUNTER — Ambulatory Visit: Payer: Self-pay | Attending: Family Medicine | Admitting: Family Medicine

## 2018-04-05 ENCOUNTER — Encounter: Payer: Self-pay | Admitting: Family Medicine

## 2018-04-05 VITALS — BP 125/84 | HR 69 | Temp 98.0°F | Ht 65.0 in | Wt 167.0 lb

## 2018-04-05 DIAGNOSIS — Z124 Encounter for screening for malignant neoplasm of cervix: Secondary | ICD-10-CM

## 2018-04-05 DIAGNOSIS — I10 Essential (primary) hypertension: Secondary | ICD-10-CM

## 2018-04-05 DIAGNOSIS — E559 Vitamin D deficiency, unspecified: Secondary | ICD-10-CM

## 2018-04-05 DIAGNOSIS — E7849 Other hyperlipidemia: Secondary | ICD-10-CM

## 2018-04-05 DIAGNOSIS — Z0000 Encounter for general adult medical examination without abnormal findings: Secondary | ICD-10-CM

## 2018-04-05 DIAGNOSIS — Z720 Tobacco use: Secondary | ICD-10-CM

## 2018-04-05 DIAGNOSIS — Z1239 Encounter for other screening for malignant neoplasm of breast: Secondary | ICD-10-CM

## 2018-04-05 DIAGNOSIS — K581 Irritable bowel syndrome with constipation: Secondary | ICD-10-CM

## 2018-04-05 MED ORDER — ATORVASTATIN CALCIUM 20 MG PO TABS: 20.0000 mg | ORAL_TABLET | Freq: Every day | ORAL | 6 refills | Status: DC

## 2018-04-05 MED ORDER — NICOTINE 14 MG/24HR TD PT24: 14.0000 mg | MEDICATED_PATCH | Freq: Every day | TRANSDERMAL | 1 refills | Status: DC

## 2018-04-05 MED ORDER — HYDROCHLOROTHIAZIDE 25 MG PO TABS: 25.0000 mg | ORAL_TABLET | Freq: Every day | ORAL | 6 refills | Status: DC

## 2018-04-05 MED ORDER — POLYETHYLENE GLYCOL 3350 17 GM/SCOOP PO POWD: 17.0000 g | Freq: Every day | ORAL | 1 refills | Status: DC

## 2018-04-05 MED FILL — ?ATORVASTATIN 20 MG TABLET: 20 | 30 days supply | Qty: 30 | Fill #0

## 2018-04-05 MED FILL — POLYETHYLENE GLYCOL 3350 PO: 17 | 14 days supply | Qty: 238 | Fill #0

## 2018-04-05 MED FILL — HYDROCHLOROTHIAZIDE 25 MG T: 25 | 30 days supply | Qty: 30 | Fill #0

## 2018-04-05 NOTE — Progress Notes (Signed)
Patient is having constipation. 

## 2018-04-05 NOTE — Patient Instructions (Signed)

## 2018-04-05 NOTE — Progress Notes (Signed)
Subjective:  Patient ID: Stacy Barrett, female    DOB: [birth year]  Age: 53 y.o. MRN: [id number]  CC: Annual Exam and Gynecologic Exam   HPI Stacy Barrett is a 53 year old female who presents for complete physical exam.  She complains of constipation which is typical of her IBS and currently does not use medications for it.  Notes her bowels about every fourth day to 1 week.  Past Medical History:  Diagnosis Date  . Anxiety   . Depression   . Edema   . Hypertension   . Lumbar radiculopathy   . Pituitary adenoma ([place])   . Vertebral fracture     No past surgical history on file.  Family History  Problem Relation Age of Onset  . CAD Mother     Allergies  Allergen Reactions  . Other Other (See Comments)    MSG -Very intense headaches  . Penicillins Hives  . Prednisone Itching, Swelling and Rash    Swelling of lips and face  . Sulfa Antibiotics Hives and Itching    Outpatient Medications Prior to Visit  Medication Sig Dispense Refill  . gabapentin (NEURONTIN) 100 MG capsule Take 200 mg by mouth 2 (two) times daily.     . nicotine (NICODERM CQ) 14 mg/24hr patch Place 1 patch (14 mg total) onto the skin daily. Then 7mg  patch daily for the next 1 month 28 patch 1  . omega-3 acid ethyl esters (LOVAZA) 1 g capsule Take 1 g by mouth 2 (two) times daily.    . sertraline (ZOLOFT) 100 MG tablet Take 1 tablet (100 mg total) by mouth daily. For depression 60 tablet 0  . Vitamin D, Ergocalciferol, (DRISDOL) 50000 units CAPS capsule Take 1 capsule (50,000 Units total) by mouth every 7 (seven) days. 16 capsule 0  . atorvastatin (LIPITOR) 20 MG tablet TAKE 1 TABLET (20 MG TOTAL) BY MOUTH DAILY. MUST MAKE APPT FOR FURTHER REFILLS 30 tablet 0  . hydrochlorothiazide (HYDRODIURIL) 25 MG tablet TAKE 1 TABLET (25 MG TOTAL) BY MOUTH DAILY. MUST MAKE APPT FOR FURTHER REFILLS 30 tablet 0  . clonazePAM (KLONOPIN) 0.5 MG tablet Take 0.5 mg by mouth at bedtime.    Marland Kitchen omeprazole (PRILOSEC) 10 MG  capsule Take 10 mg by mouth daily.    . traMADol (ULTRAM) 50 MG tablet TAKE 1 TABLET BY MOUTH EVERY 12 HOURS AS NEEDED (Patient not taking: Reported on 12/06/2017) 40 tablet 1  . traZODone (DESYREL) 50 MG tablet Take 50 mg by mouth at bedtime.     No facility-administered medications prior to visit.      ROS Review of Systems General: negative for fever, weight loss, appetite change Eyes: no visual symptoms. ENT: no ear symptoms, no sinus tenderness, no nasal congestion or sore throat. Neck: no pain  Respiratory: no wheezing, shortness of breath, cough Cardiovascular: no chest pain, no dyspnea on exertion, no pedal edema, no orthopnea. Gastrointestinal: no abdominal pain, no diarrhea, +constipation Genito-Urinary: no urinary frequency, no dysuria, no polyuria. Hematologic: no bruising Endocrine: no cold or heat intolerance Neurological: no headaches, no seizures, no tremors Musculoskeletal: no joint pains, no joint swelling Skin: no pruritus, no rash. Psychological: no depression, no anxiety,    Objective:  BP 125/84   Pulse 69   Temp 98 F (36.7 C) (Oral)   Ht 5\' 5"  (1.651 m)   Wt 167 lb (75.8 kg)   SpO2 100%   BMI 27.79 kg/m   BP/Weight 04/05/2018 12/06/2017 35/06/9739  Systolic BP 638  700 174  Diastolic BP 84 87 86  Wt. (Lbs) 167 164.4 -  BMI 27.79 27.36 -  Some encounter information is confidential and restricted. Go to Review Flowsheets activity to see all data.      Physical Exam Constitutional: normal appearing,  Eyes: PERRLA HEENT: Head is atraumatic, normal sinuses, normal oropharynx, normal appearing tonsils and palate, tympanic membrane is normal bilaterally. Neck: normal range of motion, no thyromegaly, no JVD Cardiovascular: normal rate and rhythm, normal heart sounds, no murmurs, rub or gallop, no pedal edema Respiratory: Normal breath sounds, clear to auscultation bilaterally, no wheezes, no rales, no rhonchi Breasts: Normal appearance, no  tenderness, no masses Abdomen: soft, not tender to palpation, normal bowel sounds, no enlarged organs Musculoskeletal: Full ROM, no tenderness in joints Genitourinary: Normal external genitalia, normal vagina, normal cervix, no cervical motion tenderness, normal adnexa Skin: warm and dry, no lesions. Neurological: alert, oriented x3, cranial nerves I-XII grossly intact , normal motor strength, normal sensation. Psychological: normal mood.   CMP Latest Ref Rng & Units 12/06/2017 05/22/2017 06/14/2016  Glucose 65 - 99 mg/dL 78 91 94  BUN 6 - 24 mg/dL 16 12 19   Creatinine 0.57 - 1.00 mg/dL 0.76 0.76 0.72  Sodium 134 - 144 mmol/L 142 145(H) 143  Potassium 3.5 - 5.2 mmol/L 4.7 4.5 4.2  Chloride 96 - 106 mmol/L 101 106 102  CO2 20 - 29 mmol/L 27 23 26   Calcium 8.7 - 10.2 mg/dL 11.3(H) 10.2 10.2  Total Protein 6.0 - 8.5 g/dL 7.1 6.2 6.3  Total Bilirubin 0.0 - 1.2 mg/dL 0.8 0.8 0.3  Alkaline Phos 39 - 117 IU/L 80 69 85  AST 0 - 40 IU/L 15 10 14   ALT 0 - 32 IU/L 21 15 12     Lipid Panel     Component Value Date/Time   CHOL 155 05/22/2017 1136   TRIG 124 05/22/2017 1136   HDL 47 05/22/2017 1136   CHOLHDL 3.3 05/22/2017 1136   CHOLHDL 8.0 (H) 10/28/2015 1104   VLDL 60 (H) 10/28/2015 1104   LDLCALC 83 05/22/2017 1136    CBC    Component Value Date/Time   WBC 8.1 12/06/2017 1416   WBC 9.3 12/18/2014 1853   RBC 5.38 (H) 12/06/2017 1416   RBC 5.18 (H) 12/18/2014 1853   HGB 16.2 (H) 12/06/2017 1416   HCT 47.5 (H) 12/06/2017 1416   PLT 218 12/06/2017 1416   MCV 88 12/06/2017 1416   MCH 30.1 12/06/2017 1416   MCH 29.7 12/18/2014 1853   MCHC 34.1 12/06/2017 1416   MCHC 35.6 12/18/2014 1853   RDW 12.2 (L) 12/06/2017 1416   LYMPHSABS 1.8 12/06/2017 1416   EOSABS 0.1 12/06/2017 1416   BASOSABS 0.1 12/06/2017 1416    Lab Results  Component Value Date   HGBA1C 5.7 (H) 10/08/2013    Assessment & Plan:   1. Annual physical exam Counseled on 150 minutes of exercise per week, healthy  eating (including decreased daily intake of saturated fats, cholesterol, added sugars, sodium), STI prevention, routine healthcare maintenance. - VITAMIN D 25 Hydroxy (Vit-D Deficiency, Fractures) - Ambulatory referral to Dentistry  2. Screening for breast cancer - MM DIGITAL SCREENING BILATERAL; Future  3. Screening for cervical cancer - Cytology - PAP  4. Vitamin D insufficiency Completed supplementation with Drisdol  5. Irritable bowel syndrome with constipation - polyethylene glycol powder (GLYCOLAX/MIRALAX) powder; Take 17 g by mouth daily.  Dispense: 3350 g; Refill: 1  6. Essential hypertension - hydrochlorothiazide (HYDRODIURIL) 25  MG tablet; Take 1 tablet (25 mg total) by mouth daily.  Dispense: 30 tablet; Refill: 6  7. Other hyperlipidemia - atorvastatin (LIPITOR) 20 MG tablet; Take 1 tablet (20 mg total) by mouth daily.  Dispense: 30 tablet; Refill: 6   Meds ordered this encounter  Medications  . polyethylene glycol powder (GLYCOLAX/MIRALAX) powder    Sig: Take 17 g by mouth daily.    Dispense:  3350 g    Refill:  1  . hydrochlorothiazide (HYDRODIURIL) 25 MG tablet    Sig: Take 1 tablet (25 mg total) by mouth daily.    Dispense:  30 tablet    Refill:  6  . atorvastatin (LIPITOR) 20 MG tablet    Sig: Take 1 tablet (20 mg total) by mouth daily.    Dispense:  30 tablet    Refill:  6    Follow-up: 6 months follow up of chronic medical conditions      Charlott Rakes, MD, FAAFP. [facility] [place], [place]   04/05/2018, 11:25 AM

## 2018-04-06 LAB — VITAMIN D 25 HYDROXY (VIT D DEFICIENCY, FRACTURES): VIT D 25 HYDROXY: 35.7 ng/mL (ref 30.0–100.0)

## 2018-04-10 LAB — CYTOLOGY - PAP
Diagnosis: NEGATIVE
HPV: NOT DETECTED

## 2018-04-13 MED FILL — NICOTINE 14 MG/24HR PATCH: 14 | 28 days supply | Qty: 28 | Fill #0

## 2018-04-17 ENCOUNTER — Telehealth: Payer: Self-pay

## 2018-04-17 NOTE — Telephone Encounter (Signed)
Patient was called and informed of lab results. 

## 2018-04-17 NOTE — Telephone Encounter (Signed)
-----   Message from Charlott Rakes, MD sent at 04/10/2018  5:39 PM EST ----- Pap smear is negative for malignancy

## 2018-04-19 NOTE — Telephone Encounter (Signed)
-----   Message from Charlott Rakes, MD sent at 04/06/2018 12:54 PM EST ----- Vitamin D is normal.  Okay to rely on dietary sources of vitamin D

## 2018-04-19 NOTE — Telephone Encounter (Signed)
Patient was called and informed of lab results. 

## 2018-05-17 MED FILL — HYDROCHLOROTHIAZIDE 25 MG T: 25 | 30 days supply | Qty: 30 | Fill #1

## 2018-05-17 MED FILL — ?ATORVASTATIN 20 MG TABLET: 20 | 30 days supply | Qty: 30 | Fill #1

## 2018-05-29 MED FILL — ?ATORVASTATIN 20 MG TABLET: 20 | 30 days supply | Qty: 30 | Fill #2

## 2018-06-29 MED FILL — HYDROCHLOROTHIAZIDE 25 MG T: 25 | 30 days supply | Qty: 30 | Fill #2

## 2018-06-29 MED FILL — ?ATORVASTATIN 20 MG TABLET: 20 | 30 days supply | Qty: 30 | Fill #3

## 2018-08-14 MED FILL — ?ATORVASTATIN 20 MG TABLET: 20 | 30 days supply | Qty: 30 | Fill #4

## 2018-08-14 MED FILL — ?HYDROCHLOROTHIAZIDE 25MG T: 25 | 30 days supply | Qty: 30 | Fill #3

## 2018-09-13 MED FILL — ?ATORVASTATIN 20 MG TABLET: 20 | 30 days supply | Qty: 30 | Fill #5

## 2018-09-13 MED FILL — ?HYDROCHLOROTHIAZIDE 25MG T: 25 | 30 days supply | Qty: 30 | Fill #4

## 2018-10-17 MED FILL — ?HYDROCHLOROTHIAZIDE 25MG T: 25 | 30 days supply | Qty: 30 | Fill #5

## 2018-10-17 MED FILL — ?ATORVASTATIN 20 MG TABLET: 20 | 30 days supply | Qty: 30 | Fill #6

## 2018-11-30 ENCOUNTER — Other Ambulatory Visit: Payer: Self-pay | Admitting: Family Medicine

## 2018-11-30 DIAGNOSIS — E7849 Other hyperlipidemia: Secondary | ICD-10-CM

## 2018-11-30 MED FILL — ?HYDROCHLOROTHIAZIDE 25MG T: 25 | 30 days supply | Qty: 30 | Fill #6

## 2018-12-03 MED FILL — ?ATORVASTATIN 20 MG TABLET: 20 | 30 days supply | Qty: 30 | Fill #0

## 2019-01-07 ENCOUNTER — Other Ambulatory Visit: Payer: Self-pay | Admitting: Family Medicine

## 2019-01-07 DIAGNOSIS — I10 Essential (primary) hypertension: Secondary | ICD-10-CM

## 2019-01-16 MED FILL — ?ATORVASTATIN 20 MG TABLET: 20 | 30 days supply | Qty: 30 | Fill #1

## 2019-01-16 MED FILL — ?HYDROCHLOROTHIAZIDE 25MG T: 25 | 30 days supply | Qty: 30 | Fill #0

## 2019-01-23 ENCOUNTER — Ambulatory Visit: Payer: Self-pay

## 2019-02-12 MED FILL — ?ATORVASTATIN 20 MG TABLET: 20 | 30 days supply | Qty: 30 | Fill #2

## 2019-02-12 MED FILL — ?HYDROCHLOROTHIAZIDE 25MG T: 25 | 30 days supply | Qty: 30 | Fill #1

## 2019-03-27 MED FILL — ?HYDROCHLOROTHIAZIDE 25MG T: 25 | 30 days supply | Qty: 30 | Fill #2

## 2019-03-27 MED FILL — ?ATORVASTATIN 20 MG TABLET: 20 | 30 days supply | Qty: 30 | Fill #3

## 2019-05-07 ENCOUNTER — Other Ambulatory Visit: Payer: Self-pay | Admitting: Family Medicine

## 2019-05-07 DIAGNOSIS — I10 Essential (primary) hypertension: Secondary | ICD-10-CM

## 2019-05-07 MED FILL — ?ATORVASTATIN 20 MG TABLET: 20 | 30 days supply | Qty: 30 | Fill #4

## 2019-05-08 ENCOUNTER — Other Ambulatory Visit: Payer: Self-pay | Admitting: Pharmacist

## 2019-05-08 DIAGNOSIS — I10 Essential (primary) hypertension: Secondary | ICD-10-CM

## 2019-05-08 MED ORDER — HYDROCHLOROTHIAZIDE 25 MG PO TABS: 25.0000 mg | ORAL_TABLET | Freq: Every day | ORAL | 0 refills | Status: DC

## 2019-05-08 MED FILL — HYDROCHLOROTHIAZIDE 25 MG T: 25 | 30 days supply | Qty: 30 | Fill #0

## 2019-05-20 ENCOUNTER — Other Ambulatory Visit: Payer: Self-pay | Admitting: Family Medicine

## 2019-05-20 DIAGNOSIS — I10 Essential (primary) hypertension: Secondary | ICD-10-CM

## 2019-06-10 ENCOUNTER — Ambulatory Visit: Payer: Self-pay | Attending: Internal Medicine

## 2019-06-10 DIAGNOSIS — Z23 Encounter for immunization: Secondary | ICD-10-CM

## 2019-06-10 NOTE — Progress Notes (Signed)
   Covid-19 Vaccination Clinic  Name:  ALIX SEYMORE    MRN: [id number] DOB: 1965-12-30  06/10/2019  Ms. Savitt was observed post Covid-19 immunization for 15 minutes without incident. She was provided with Vaccine Information Sheet and instruction to access the V-Safe system.   Ms. Tewolde was instructed to call 911 with any severe reactions post vaccine: Marland Kitchen Difficulty breathing  . Swelling of face and throat  . A fast heartbeat  . A bad rash all over body  . Dizziness and weakness   Immunizations Administered    Name Date Dose VIS Date Route   Pfizer COVID-19 Vaccine 06/10/2019  2:37 PM 0.3 mL 04/10/2018 Intramuscular   Manufacturer: [place]   Lot: [id number]   [place]: [id number]

## 2019-06-12 ENCOUNTER — Ambulatory Visit: Payer: Self-pay | Admitting: Family Medicine

## 2019-06-13 ENCOUNTER — Ambulatory Visit: Payer: Self-pay

## 2019-06-20 MED FILL — HYDROCHLOROTHIAZIDE 25 MG T: 25 | 30 days supply | Qty: 30 | Fill #0

## 2019-06-20 MED FILL — ATORVASTATIN CALCIUM 20 MG: 20 | 30 days supply | Qty: 30 | Fill #4

## 2019-07-18 ENCOUNTER — Telehealth: Payer: Self-pay | Admitting: Family Medicine

## 2019-07-18 ENCOUNTER — Ambulatory Visit: Payer: Self-pay | Admitting: Family Medicine

## 2019-07-18 DIAGNOSIS — E7849 Other hyperlipidemia: Secondary | ICD-10-CM

## 2019-07-18 DIAGNOSIS — I10 Essential (primary) hypertension: Secondary | ICD-10-CM

## 2019-07-18 NOTE — Telephone Encounter (Signed)
Pt need refill until she see the pcp for atorvastatin (LIPITOR) 20 MG tablet hydrochlorothiazide (HYDRODIURIL) 25 MG tablet  Please send it to [facility], [address]  [place], [place] [address]

## 2019-07-19 MED ORDER — HYDROCHLOROTHIAZIDE 25 MG PO TABS: 25.0000 mg | ORAL_TABLET | Freq: Every day | ORAL | 0 refills | Status: DC

## 2019-07-19 MED ORDER — ATORVASTATIN CALCIUM 20 MG PO TABS: 20.0000 mg | ORAL_TABLET | Freq: Every day | ORAL | 0 refills | Status: DC

## 2019-07-19 MED FILL — HYDROCHLOROTHIAZIDE 25 MG T: 25 | 30 days supply | Qty: 30 | Fill #0

## 2019-07-19 MED FILL — ATORVASTATIN CALCIUM 20 MG: 20 | 30 days supply | Qty: 30 | Fill #0

## 2019-07-19 NOTE — Telephone Encounter (Signed)
Patient was called and a voicemail was left informing patient that medications have been refilled.

## 2019-07-19 NOTE — Telephone Encounter (Signed)
Patient has not been seen since 04/05/2018 she has a upcoming visit on 08/20/19 with [place].

## 2019-07-19 NOTE — Addendum Note (Signed)
Addended by: Karle Plumber B on: 07/19/2019 01:30 PM   Modules accepted: Orders

## 2019-07-31 ENCOUNTER — Other Ambulatory Visit: Payer: Self-pay

## 2019-07-31 ENCOUNTER — Ambulatory Visit: Payer: Self-pay | Attending: Family Medicine

## 2019-08-13 ENCOUNTER — Telehealth: Payer: Self-pay | Admitting: Family Medicine

## 2019-08-13 NOTE — Telephone Encounter (Signed)
Pt was sent a letter from financial dept. Inform them, that the application they submitted was incomplete, since they were missing some documentation at the time of the appointment, Pt need to reschedule and resubmit all new papers and application for CAFA and [place], P.S. old documents has been sent back by mail to the Pt and Pt. need to make a new appt. 

## 2019-08-20 ENCOUNTER — Ambulatory Visit: Payer: Self-pay | Admitting: Family Medicine

## 2019-09-18 ENCOUNTER — Ambulatory Visit: Payer: Self-pay | Admitting: Family Medicine

## 2019-09-18 ENCOUNTER — Ambulatory Visit: Payer: Self-pay | Admitting: *Deleted

## 2019-09-18 NOTE — Telephone Encounter (Signed)
Called patient back to reassess temperature and given information of how to schedule covid testing. Patient reports she has not taken temperature. Encouraged covid testing due to spreading to disabled mother . Information reviewed so patient can set up appt that will work for her on [email or web address]. Patient verbalized understanding.

## 2019-09-18 NOTE — Telephone Encounter (Signed)
flu like symptoms- requesting to speak with nurse    Patient requesting to speak with RN. She states she has body aches, coughing, sneezing      c/o severe fatigue and body aches now. Mild headache, low back pain no appetite, stomach feels "unwell", minimal cough, urine dark in color. Possible fever reported due to feeling "like I have a fever". Patient has not checked fever and was drinking coffee while on call. Instructed patient to wait and check temperature. Missed scheduled appt. Today with PCP due to persistent symptoms. Symptoms better since start on or around July 25. Patient reports yesterday and today is the first days she has been able to get out of bed. Care advise given. Patient reported she could not be tested for covid today due to caring for disabled mother. Encouraged patient to get tested due to possibility of spreading sickness to her mother. Reviewed isolation precautions for covid and/ or sickness.  Patient verbalized understanding of care advice and to call back or seek help at urgent care or ED if symptoms worsen.   Reason for Disposition  [1] MODERATE weakness (i.e., interferes with work, school, normal activities) AND [2] persists > 3 days  Answer Assessment - Initial Assessment Questions 1. DESCRIPTION: "Describe how you are feeling."     "terrible" with body aches, joint pain, mild headache, fatigue, low back pain, no appetite, stomach feels "unwell" minimal cough, possible fever 2. SEVERITY: "How bad is it?"  "Can you stand and walk?"   - MILD - Feels weak or tired, but does not interfere with work, school or normal activities   - [place] to stand and walk; weakness interferes with work, school, or normal activities   - SEVERE - Unable to stand or walk     Moderate now . July 25- August 1 severe 3. ONSET:  "When did the weakness begin?"     July 25 4. CAUSE: "What do you think is causing the weakness?"     Does not know maybe flu or covid 5. MEDICINES: "Have  you recently started a new medicine or had a change in the amount of a medicine?"     no 6. OTHER SYMPTOMS: "Do you have any other symptoms?" (e.g., chest pain, fever, cough, SOB, vomiting, diarrhea, bleeding, other areas of pain)     Has not checked for fever, diarrhea 5 days ago.  7. PREGNANCY: "Is there any chance you are pregnant?" "When was your last menstrual period?"      Post menopausal  Protocols used: WEAKNESS (GENERALIZED) AND FATIGUE-A-AH

## 2019-09-18 NOTE — Telephone Encounter (Signed)
Noted  

## 2019-12-10 ENCOUNTER — Other Ambulatory Visit (HOSPITAL_COMMUNITY): Payer: Self-pay | Admitting: Psychiatry

## 2019-12-10 ENCOUNTER — Other Ambulatory Visit: Payer: Self-pay

## 2019-12-10 ENCOUNTER — Telehealth (INDEPENDENT_AMBULATORY_CARE_PROVIDER_SITE_OTHER): Payer: No Payment, Other | Admitting: Psychiatry

## 2019-12-10 ENCOUNTER — Encounter (HOSPITAL_COMMUNITY): Payer: Self-pay

## 2019-12-10 DIAGNOSIS — F331 Major depressive disorder, recurrent, moderate: Secondary | ICD-10-CM | POA: Diagnosis not present

## 2019-12-10 DIAGNOSIS — E7849 Other hyperlipidemia: Secondary | ICD-10-CM

## 2019-12-10 DIAGNOSIS — E559 Vitamin D deficiency, unspecified: Secondary | ICD-10-CM

## 2019-12-10 DIAGNOSIS — I10 Essential (primary) hypertension: Secondary | ICD-10-CM

## 2019-12-10 DIAGNOSIS — F419 Anxiety disorder, unspecified: Secondary | ICD-10-CM

## 2019-12-10 MED ORDER — BUSPIRONE HCL 10 MG PO TABS: 10.0000 mg | ORAL_TABLET | Freq: Two times a day (BID) | ORAL | 2 refills | Status: DC

## 2019-12-10 MED ORDER — VITAMIN D (ERGOCALCIFEROL) 1.25 MG (50000 UNIT) PO CAPS: 50000.0000 [IU] | ORAL_CAPSULE | ORAL | 0 refills | Status: DC

## 2019-12-10 MED ORDER — GABAPENTIN 300 MG PO CAPS: 300.0000 mg | ORAL_CAPSULE | Freq: Three times a day (TID) | ORAL | 2 refills | Status: DC

## 2019-12-10 MED ORDER — ATORVASTATIN CALCIUM 20 MG PO TABS: 20.0000 mg | ORAL_TABLET | Freq: Every day | ORAL | 2 refills | Status: DC

## 2019-12-10 MED ORDER — HYDROCHLOROTHIAZIDE 25 MG PO TABS: 25.0000 mg | ORAL_TABLET | Freq: Every day | ORAL | 0 refills | Status: DC

## 2019-12-10 MED ORDER — ARIPIPRAZOLE 5 MG PO TABS: 5.0000 mg | ORAL_TABLET | Freq: Every day | ORAL | 2 refills | Status: DC

## 2019-12-10 MED ORDER — SERTRALINE HCL 100 MG PO TABS: 150.0000 mg | ORAL_TABLET | Freq: Every day | ORAL | 2 refills | Status: DC

## 2019-12-10 NOTE — Progress Notes (Signed)
Psychiatric Initial Adult Assessment   Patient Identification: Stacy Barrett MRN:  [id number] Date of Evaluation:  12/10/2019 Referral Source: [facility] Chief Complaint:   Chief Complaint    Anxiety; Depression     Visit Diagnosis:    ICD-10-CM   1. Moderate episode of recurrent major depressive disorder (HCC)  F33.1   2. Other hyperlipidemia  E78.49 atorvastatin (LIPITOR) 20 MG tablet  3. Essential hypertension  I10 hydrochlorothiazide (HYDRODIURIL) 25 MG tablet  4. Vitamin D deficiency  E55.9 Vitamin D, Ergocalciferol, (DRISDOL) 1.25 MG (50000 UNIT) CAPS capsule  5. Anxiety  F41.9     History of Present Illness:   Stacy Barrett is a 54 yo [place] female who presents for an initial visit to establish care "as a follow-up to keep my medications going". She was a long-term client at [facility], receiving treatment for depression and anxiety. "My medications are finally just right and working well for me- as best they can." Stacy Barrett is the sole caregiver of her 63 yo mother, who suffered from a head injury some years back and recently had two heart attacks. "I was living completely independently and had a good job. I had to give up my freedom and my life when she needed more care. She is all that I have, but it is not easy. She needs constant care. It has improved "a little bit recently, since she got out of the rehab facility- she is a little more independent now." Stacy Barrett does not have family members or in-home caregivers to provide relief, but she does enjoy her times alone in the early mornings or the evenings after her mother is bed. Over the last week, Stacy Barrett rates her depression symptoms as "4-5" on a scale of 1-10, "but that was because I pretty stressed when she first came back home. It's getting better." Denies SI/HI, hallucinations, racing thoughts/decreased need for sleep. She smokes  PPD tobacco (no vaping). This is increased lately from 7 ciggs/day "due to my nerves from what has been going on with my mom". She  did use the nicotine patch in the past, but is not ready to quit right now and "I'm not going to use if until I am ready to quit." Uses THC "occasionally, maybe 2x/wk". Rare ETOH, no other substances. She appreciates the mental health care and support  she received at [facility], but did not find the individual group or individual psychotherapy helpful. "The therapists that I saw was 54 yrs old and just encouraged me to be more positive, and my life is hard. That is. Not particularly helpful. And the group therapy involved listening to people whose lives were worse than mine, so that wasn't helping either. I'm okay without therapy."       Stacy Barrett is an only child, and she was particularly close to her father until she was 63 yrs old. "He abandoned me when he divorced my mom. He basically divorced [place] both. That is still really hard for me." He passed away in 05/07/2022 of this year. She feels like her "relationships with men, including my dad, have traumatized me." She was not close to her mother in childhood, whom she suspects likely had "borderline personality disorder". Stacy Barrett is divorced and has no children. Other than her psychotropic medications, she had an RX for lipitor and hydrochlorothiazide provided by her PCP (6 mos ago),  but "I did not make it to pick it up. I've been too embarrassed to call to get it called back in again. But I have  swollen ankles now and I have an appointment with my PCP scheduled for 12/16/2019 and I know I need it." She is exercising and eating whole foods, and her sleep has improved. "My hormones are stabilizing". Takes occasional trazodone for insomnia but "not very often".  Associated Signs/Symptoms: Depression Symptoms:  depressed mood, anxiety, (Hypo) Manic Symptoms:  none Anxiety Symptoms:  Excessive Worry, Psychotic Symptoms:  none PTSD Symptoms: Had a traumatic exposure:  frequent abandonment by men in her life, including her father  Past Psychiatric History: depression,  anxiety  Previous Psychotropic Medications: Yes   Substance Abuse History in the last 12 months:  No.  Consequences of Substance Abuse: NA  Past Medical History:  Past Medical History:  Diagnosis Date  . Anxiety   . Depression   . Edema   . Hypertension   . Lumbar radiculopathy   . Pituitary adenoma ([place])   . Vertebral fracture    History reviewed. No pertinent surgical history.  Family Psychiatric History: mother with personality d/o  Family History:  Family History  Problem Relation Age of Onset  . CAD Mother     Social History:   Social History   Socioeconomic History  . Marital status: Unknown    Spouse name: Not on file  . Number of children: Not on file  . Years of education: Not on file  . Highest education level: Not on file  Occupational History  . Not on file  Tobacco Use  . Smoking status: Current Every Day Smoker    Packs/day: 0.50    Types: Cigarettes  . Smokeless tobacco: Never Used  Vaping Use  . Vaping Use: Former  Substance and Sexual Activity  . Alcohol use: Yes    Alcohol/week: 1.0 standard drink    Types: 1 Glasses of wine per week    Comment: "hardly ever"  . Drug use: Yes    Frequency: 2.0 times per week    Types: Marijuana  . Sexual activity: Not on file  Other Topics Concern  . Not on file  Social History Narrative  . Not on file   Social Determinants of Health   Financial Resource Strain:   . Difficulty of Paying Living Expenses: Not on file  Food Insecurity:   . Worried About Charity fundraiser in the Last Year: Not on file  . Ran Out of Food in the Last Year: Not on file  Transportation Needs:   . Lack of Transportation (Medical): Not on file  . Lack of Transportation (Non-Medical): Not on file  Physical Activity:   . Days of Exercise per Week: Not on file  . Minutes of Exercise per Session: Not on file  Stress:   . Feeling of Stress : Not on file  Social Connections:   . Frequency of Communication with Friends  and Family: Not on file  . Frequency of Social Gatherings with Friends and Family: Not on file  . Attends Religious Services: Not on file  . Active Member of Clubs or Organizations: Not on file  . Attends Archivist Meetings: Not on file  . Marital Status: Not on file    Additional Social History: taking care of her elderly mother  Allergies:   Allergies  Allergen Reactions  . Other Other (See Comments)    MSG -Very intense headaches  . Penicillins Hives  . Prednisone Itching, Swelling and Rash    Swelling of lips and face  . Sulfa Antibiotics Hives and Itching  Metabolic Disorder Labs: Lab Results  Component Value Date   HGBA1C 5.7 (H) 10/08/2013   MPG 117 (H) 10/08/2013   Lab Results  Component Value Date   PROLACTIN 37.5 (H) 10/28/2015   PROLACTIN 36.8 07/18/2014   Lab Results  Component Value Date   CHOL 155 05/22/2017   TRIG 124 05/22/2017   HDL 47 05/22/2017   CHOLHDL 3.3 05/22/2017   VLDL 60 (H) 10/28/2015   LDLCALC 83 05/22/2017   LDLCALC 86 06/14/2016   Lab Results  Component Value Date   TSH 1.130 12/06/2017    Therapeutic Level Labs: Lab Results  Component Value Date   LITHIUM <0.25 (L) 10/03/2013   No results found for: CBMZ No results found for: VALPROATE  Current Medications: Current Outpatient Medications  Medication Sig Dispense Refill  . atorvastatin (LIPITOR) 20 MG tablet Take 1 tablet (20 mg total) by mouth daily. 30 tablet 2  . hydrochlorothiazide (HYDRODIURIL) 25 MG tablet Take 1 tablet (25 mg total) by mouth daily. Must have office visit for refills 30 tablet 0  . Vitamin D, Ergocalciferol, (DRISDOL) 1.25 MG (50000 UNIT) CAPS capsule Take 1 capsule (50,000 Units total) by mouth every 7 (seven) days. 16 capsule 0  . ARIPiprazole (ABILIFY) 5 MG tablet Take 1 tablet (5 mg total) by mouth daily. 30 tablet 2  . busPIRone (BUSPAR) 10 MG tablet Take 1 tablet (10 mg total) by mouth 2 (two) times daily. 60 tablet 2  . gabapentin  (NEURONTIN) 300 MG capsule Take 1 capsule (300 mg total) by mouth 3 (three) times daily. 90 capsule 2  . sertraline (ZOLOFT) 100 MG tablet Take 1.5 tablets (150 mg total) by mouth daily. 45 tablet 2   No current facility-administered medications for this visit.    Musculoskeletal: Strength & Muscle Tone: within normal limits Gait & Station: normal Patient leans: N/A  Psychiatric Specialty Exam: Review of Systems  Musculoskeletal: Positive for back pain.  Psychiatric/Behavioral: Positive for dysphoric mood. The patient is nervous/anxious.   All other systems reviewed and are negative.   There were no vitals taken for this visit.There is no height or weight on file to calculate BMI.  General Appearance: Casual  Eye Contact:  Good  Speech:  Normal Rate  Volume:  Normal  Mood:  Anxious and Depressed  Affect:  Congruent  Thought Process:  Coherent and Descriptions of Associations: Intact  Orientation:  Full (Time, Place, and Person)  Thought Content:  WDL and Logical  Suicidal Thoughts:  No  Homicidal Thoughts:  No  Memory:  Immediate;   Good Recent;   Good Remote;   Good  Judgement:  Good  Insight:  Good  Psychomotor Activity:  Normal  Concentration:  Concentration: Good and Attention Span: Good  Recall:  Good  Fund of Knowledge:Good  Language: Good  Akathisia:  No  Handed:  Right  AIMS (if indicated):  not done  Assets:  Housing Leisure Time Physical Health Resilience Social Support  ADL's:  Intact  Cognition: WNL  Sleep:  Good   Screenings: AUDIT     Admission (Discharged) from 10/06/2013 in [facility]  Alcohol Use Disorder Identification Test Final Score (AUDIT) 0    GAD-7     Office Visit from 04/05/2018 in [place] Office Visit from 12/06/2017 in [place] Office Visit from 05/19/2017 in [place] Office Visit from 06/13/2016 in [facility] Office Visit from 10/23/2015 in [place]  Total GAD-7 Score 9 8 10 7 17     PHQ2-9     Office Visit from 04/05/2018 in [place] Office Visit from 12/06/2017 in [place] Office Visit from 05/19/2017 in [place] Office Visit from 06/13/2016 in [place] Office Visit from 10/23/2015 in [place]  PHQ-2 Total Score 5 2 4 2 5   PHQ-9 Total Score 19 6 11 5 20      Virtual Visit via Video Note  I connected with Stacy Barrett on 12/10/19 at  3:00 PM EDT by a video enabled telemedicine application and verified that I am speaking with the correct person using two identifiers.  Location: Patient: home Provider: home   I discussed the limitations of evaluation and management by telemedicine and the availability of in person appointments. The patient expressed understanding and agreed to proceed.   Follow Up Instructions: Follow up in 3 months   I discussed the assessment and treatment plan with the patient. The patient was provided an opportunity to ask questions and all were answered. The patient agreed with the plan and demonstrated an understanding of the instructions.   The patient was advised to call back or seek an in-person evaluation if the symptoms worsen or if the condition fails to improve as anticipated.  I provided 45 minutes of non-face-to-face time during this encounter.  Assessment and Plan:  Major depressive disorder, recurrent, moderate: -Continue Zoloft 150 mg daily -Continue Abilify 5 mg daily -Follow up in 3 months -Recommend therapy, client not interested   Anxiety: -Continue Buspar 10 mg BID -Continue gabapentin 300 mg TID   Waylan Boga, NP 10/26/20213:39 PM

## 2019-12-11 MED FILL — ATORVASTATIN CALCIUM 20 MG: 20 | 30 days supply | Qty: 30 | Fill #0

## 2019-12-11 MED FILL — VIT D2 1.25 MG (50,000 UNIT: 1.25 MG | 84 days supply | Qty: 12 | Fill #0

## 2019-12-11 MED FILL — HYDROCHLOROTHIAZIDE 25 MG T: 25 | 30 days supply | Qty: 30 | Fill #0

## 2019-12-16 ENCOUNTER — Encounter: Payer: Self-pay | Admitting: Family Medicine

## 2019-12-16 ENCOUNTER — Other Ambulatory Visit: Payer: Self-pay | Admitting: Family Medicine

## 2019-12-16 ENCOUNTER — Ambulatory Visit: Payer: Self-pay | Attending: Family Medicine | Admitting: Family Medicine

## 2019-12-16 ENCOUNTER — Other Ambulatory Visit: Payer: Self-pay

## 2019-12-16 VITALS — BP 168/96 | HR 80 | Ht 65.0 in | Wt 192.0 lb

## 2019-12-16 DIAGNOSIS — Z1231 Encounter for screening mammogram for malignant neoplasm of breast: Secondary | ICD-10-CM

## 2019-12-16 DIAGNOSIS — I10 Essential (primary) hypertension: Secondary | ICD-10-CM

## 2019-12-16 DIAGNOSIS — R600 Localized edema: Secondary | ICD-10-CM

## 2019-12-16 DIAGNOSIS — E7849 Other hyperlipidemia: Secondary | ICD-10-CM

## 2019-12-16 DIAGNOSIS — Z0000 Encounter for general adult medical examination without abnormal findings: Secondary | ICD-10-CM

## 2019-12-16 DIAGNOSIS — Z1211 Encounter for screening for malignant neoplasm of colon: Secondary | ICD-10-CM

## 2019-12-16 DIAGNOSIS — E669 Obesity, unspecified: Secondary | ICD-10-CM

## 2019-12-16 DIAGNOSIS — Z23 Encounter for immunization: Secondary | ICD-10-CM

## 2019-12-16 MED ORDER — HYDROCHLOROTHIAZIDE 25 MG PO TABS: 25.0000 mg | ORAL_TABLET | Freq: Every day | ORAL | 6 refills | Status: DC

## 2019-12-16 MED ORDER — ATORVASTATIN CALCIUM 20 MG PO TABS
20.0000 mg | ORAL_TABLET | Freq: Every day | ORAL | 6 refills | Status: DC
  Filled 2020-06-17: qty 30, 30d supply, fill #0
  Filled 2020-08-10 – 2020-09-01 (×2): qty 30, 30d supply, fill #1
  Filled 2020-10-08: qty 30, 30d supply, fill #2
  Filled 2020-11-19: qty 30, 30d supply, fill #3

## 2019-12-16 NOTE — Patient Instructions (Signed)
Health Maintenance, Female Adopting a healthy lifestyle and getting preventive care are important in promoting health and wellness. Ask your health care provider about:  The right schedule for you to have regular tests and exams.  Things you can do on your own to prevent diseases and keep yourself healthy. What should I know about diet, weight, and exercise? Eat a healthy diet   Eat a diet that includes plenty of vegetables, fruits, low-fat dairy products, and lean protein.  Do not eat a lot of foods that are high in solid fats, added sugars, or sodium. Maintain a healthy weight Body mass index (BMI) is used to identify weight problems. It estimates body fat based on height and weight. Your health care provider can help determine your BMI and help you achieve or maintain a healthy weight. Get regular exercise Get regular exercise. This is one of the most important things you can do for your health. Most adults should:  Exercise for at least 150 minutes each week. The exercise should increase your heart rate and make you sweat (moderate-intensity exercise).  Do strengthening exercises at least twice a week. This is in addition to the moderate-intensity exercise.  Spend less time sitting. Even light physical activity can be beneficial. Watch cholesterol and blood lipids Have your blood tested for lipids and cholesterol at 55 years of age, then have this test every 5 years. Have your cholesterol levels checked more often if:  Your lipid or cholesterol levels are high.  You are older than 55 years of age.  You are at high risk for heart disease. What should I know about cancer screening? Depending on your health history and family history, you may need to have cancer screening at various ages. This may include screening for:  Breast cancer.  Cervical cancer.  Colorectal cancer.  Skin cancer.  Lung cancer. What should I know about heart disease, diabetes, and high blood  pressure? Blood pressure and heart disease  High blood pressure causes heart disease and increases the risk of stroke. This is more likely to develop in people who have high blood pressure readings, are of African descent, or are overweight.  Have your blood pressure checked: ? Every 3-5 years if you are 18-39 years of age. ? Every year if you are 40 years old or older. Diabetes Have regular diabetes screenings. This checks your fasting blood sugar level. Have the screening done:  Once every three years after age 40 if you are at a normal weight and have a low risk for diabetes.  More often and at a younger age if you are overweight or have a high risk for diabetes. What should I know about preventing infection? Hepatitis B If you have a higher risk for hepatitis B, you should be screened for this virus. Talk with your health care provider to find out if you are at risk for hepatitis B infection. Hepatitis C Testing is recommended for:  Everyone born from 1945 through 1965.  Anyone with known risk factors for hepatitis C. Sexually transmitted infections (STIs)  Get screened for STIs, including gonorrhea and chlamydia, if: ? You are sexually active and are younger than 55 years of age. ? You are older than 55 years of age and your health care provider tells you that you are at risk for this type of infection. ? Your sexual activity has changed since you were last screened, and you are at increased risk for chlamydia or gonorrhea. Ask your health care provider if   you are at risk.  Ask your health care provider about whether you are at high risk for HIV. Your health care provider may recommend a prescription medicine to help prevent HIV infection. If you choose to take medicine to prevent HIV, you should first get tested for HIV. You should then be tested every 3 months for as long as you are taking the medicine. Pregnancy  If you are about to stop having your period (premenopausal) and  you may become pregnant, seek counseling before you get pregnant.  Take 400 to 800 micrograms (mcg) of folic acid every day if you become pregnant.  Ask for birth control (contraception) if you want to prevent pregnancy. Osteoporosis and menopause Osteoporosis is a disease in which the bones lose minerals and strength with aging. This can result in bone fractures. If you are 65 years old or older, or if you are at risk for osteoporosis and fractures, ask your health care provider if you should:  Be screened for bone loss.  Take a calcium or vitamin D supplement to lower your risk of fractures.  Be given hormone replacement therapy (HRT) to treat symptoms of menopause. Follow these instructions at home: Lifestyle  Do not use any products that contain nicotine or tobacco, such as cigarettes, e-cigarettes, and chewing tobacco. If you need help quitting, ask your health care provider.  Do not use street drugs.  Do not share needles.  Ask your health care provider for help if you need support or information about quitting drugs. Alcohol use  Do not drink alcohol if: ? Your health care provider tells you not to drink. ? You are pregnant, may be pregnant, or are planning to become pregnant.  If you drink alcohol: ? Limit how much you use to 0-1 drink a day. ? Limit intake if you are breastfeeding.  Be aware of how much alcohol is in your drink. In the [place] one drink equals one 12 oz bottle of beer (355 mL), one 5 oz glass of wine (148 mL), or one 1 oz glass of hard liquor (44 mL). General instructions  Schedule regular health, dental, and eye exams.  Stay current with your vaccines.  Tell your health care provider if: ? You often feel depressed. ? You have ever been abused or do not feel safe at home. Summary  Adopting a healthy lifestyle and getting preventive care are important in promoting health and wellness.  Follow your health care provider's instructions about healthy  diet, exercising, and getting tested or screened for diseases.  Follow your health care provider's instructions on monitoring your cholesterol and blood pressure. This information is not intended to replace advice given to you by your health care provider. Make sure you discuss any questions you have with your health care provider. Document Revised: 01/24/2018 Document Reviewed: 01/24/2018 Elsevier Patient Education  2020 [employer]  

## 2019-12-16 NOTE — Progress Notes (Signed)
Subjective:  Patient ID: Stacy Barrett, female    DOB: Jul 12, 1965  Age: 54 y.o. MRN: [id number]  CC: Annual Exam and Gynecologic Exam   HPI SHAJUAN MUSSO is a 54 year old female with history of hypertension, hyperlipidemia, tobacco abuse, anxiety who presents for an annual physical exam.  She has been out of her antihypertensive as she never made it to the pharmacy to pick up on hydrochlorothiazide due to losing her Dad and not wanting to get out of the house.  Currently seeing a psychotherapist and psychiatrist.  Has also not been compliant with her statin. She has noticed pedal edema more in her left ankle.  Also has intermittent ankle pain and endorses a history of previously twisting her ankle a couple of years back. Past Medical History:  Diagnosis Date  . Anxiety   . Depression   . Edema   . Hypertension   . Lumbar radiculopathy   . Pituitary adenoma ([place])   . Vertebral fracture     No past surgical history on file.  Family History  Problem Relation Age of Onset  . CAD Mother     Allergies  Allergen Reactions  . Other Other (See Comments)    MSG -Very intense headaches  . Penicillins Hives  . Prednisone Itching, Swelling and Rash    Swelling of lips and face  . Sulfa Antibiotics Hives and Itching    Outpatient Medications Prior to Visit  Medication Sig Dispense Refill  . ARIPiprazole (ABILIFY) 5 MG tablet Take 1 tablet (5 mg total) by mouth daily. 30 tablet 2  . busPIRone (BUSPAR) 10 MG tablet Take 1 tablet (10 mg total) by mouth 2 (two) times daily. 60 tablet 2  . gabapentin (NEURONTIN) 300 MG capsule Take 1 capsule (300 mg total) by mouth 3 (three) times daily. 90 capsule 2  . sertraline (ZOLOFT) 100 MG tablet Take 1.5 tablets (150 mg total) by mouth daily. 45 tablet 2  . Vitamin D, Ergocalciferol, (DRISDOL) 1.25 MG (50000 UNIT) CAPS capsule Take 1 capsule (50,000 Units total) by mouth every 7 (seven) days. 16 capsule 0  . atorvastatin (LIPITOR) 20 MG tablet  Take 1 tablet (20 mg total) by mouth daily. (Patient not taking: Reported on 12/16/2019) 30 tablet 2  . hydrochlorothiazide (HYDRODIURIL) 25 MG tablet Take 1 tablet (25 mg total) by mouth daily. Must have office visit for refills (Patient not taking: Reported on 12/16/2019) 30 tablet 0   No facility-administered medications prior to visit.     ROS Review of Systems  Constitutional: Negative for activity change, appetite change and fatigue.  HENT: Negative for congestion, sinus pressure and sore throat.   Eyes: Negative for visual disturbance.  Respiratory: Negative for cough, chest tightness, shortness of breath and wheezing.   Cardiovascular: Positive for leg swelling. Negative for chest pain and palpitations.  Gastrointestinal: Negative for abdominal distention, abdominal pain and constipation.  Endocrine: Negative for polydipsia.  Genitourinary: Negative for dysuria and frequency.  Musculoskeletal: Negative for arthralgias and back pain.  Skin: Negative for rash.  Neurological: Negative for tremors, light-headedness and numbness.  Hematological: Does not bruise/bleed easily.  Psychiatric/Behavioral: Negative for agitation and behavioral problems.    Objective:  BP (!) 168/96   Pulse 80   Ht '5\' 5"'  (1.651 m)   Wt 192 lb (87.1 kg)   SpO2 99%   BMI 31.95 kg/m   BP/Weight 12/16/2019 04/05/2018 32/67/1245  Systolic BP 809 983 382  Diastolic BP 96 84 87  Wt. (Lbs)  192 167 164.4  BMI 31.95 27.79 27.36  Some encounter information is confidential and restricted. Go to Review Flowsheets activity to see all data.      Physical Exam Constitutional:      General: She is not in acute distress.    Appearance: She is well-developed. She is not diaphoretic.  HENT:     Head: Normocephalic.     Right Ear: External ear normal.     Left Ear: External ear normal.     Nose: Nose normal.  Eyes:     Conjunctiva/sclera: Conjunctivae normal.     Pupils: Pupils are equal, round, and reactive  to light.  Neck:     Vascular: No JVD.  Cardiovascular:     Rate and Rhythm: Normal rate and regular rhythm.     Heart sounds: Normal heart sounds. No murmur heard.  No gallop.   Pulmonary:     Effort: Pulmonary effort is normal. No respiratory distress.     Breath sounds: Normal breath sounds. No wheezing or rales.  Chest:     Chest wall: No tenderness.     Breasts:        Right: No swelling, mass or tenderness.        Left: No swelling, mass or tenderness.  Abdominal:     General: Bowel sounds are normal. There is no distension.     Palpations: Abdomen is soft. There is no mass.     Tenderness: There is no abdominal tenderness.  Genitourinary:    Comments: External genitalia, vagina, cervix, adnexa-normal Musculoskeletal:        General: No tenderness. Normal range of motion.     Cervical back: Normal range of motion.     Left lower leg: Edema (1+ non pitting ankle edema) present.  Skin:    General: Skin is warm and dry.  Neurological:     Mental Status: She is alert and oriented to person, place, and time.     Deep Tendon Reflexes: Reflexes are normal and symmetric.     CMP Latest Ref Rng & Units 12/06/2017 05/22/2017 06/14/2016  Glucose 65 - 99 mg/dL 78 91 94  BUN 6 - 24 mg/dL '16 12 19  ' Creatinine 0.57 - 1.00 mg/dL 0.76 0.76 0.72  Sodium 134 - 144 mmol/L 142 145(H) 143  Potassium 3.5 - 5.2 mmol/L 4.7 4.5 4.2  Chloride 96 - 106 mmol/L 101 106 102  CO2 20 - 29 mmol/L '27 23 26  ' Calcium 8.7 - 10.2 mg/dL 11.3(H) 10.2 10.2  Total Protein 6.0 - 8.5 g/dL 7.1 6.2 6.3  Total Bilirubin 0.0 - 1.2 mg/dL 0.8 0.8 0.3  Alkaline Phos 39 - 117 IU/L 80 69 85  AST 0 - 40 IU/L '15 10 14  ' ALT 0 - 32 IU/L '21 15 12    ' Lipid Panel     Component Value Date/Time   CHOL 155 05/22/2017 1136   TRIG 124 05/22/2017 1136   HDL 47 05/22/2017 1136   CHOLHDL 3.3 05/22/2017 1136   CHOLHDL 8.0 (H) 10/28/2015 1104   VLDL 60 (H) 10/28/2015 1104   LDLCALC 83 05/22/2017 1136    CBC    Component  Value Date/Time   WBC 8.1 12/06/2017 1416   WBC 9.3 12/18/2014 1853   RBC 5.38 (H) 12/06/2017 1416   RBC 5.18 (H) 12/18/2014 1853   HGB 16.2 (H) 12/06/2017 1416   HCT 47.5 (H) 12/06/2017 1416   PLT 218 12/06/2017 1416   MCV 88 12/06/2017 1416  MCH 30.1 12/06/2017 1416   MCH 29.7 12/18/2014 1853   MCHC 34.1 12/06/2017 1416   MCHC 35.6 12/18/2014 1853   RDW 12.2 (L) 12/06/2017 1416   LYMPHSABS 1.8 12/06/2017 1416   EOSABS 0.1 12/06/2017 1416   BASOSABS 0.1 12/06/2017 1416    Lab Results  Component Value Date   HGBA1C 5.7 (H) 10/08/2013    Assessment & Plan:  1. Annual physical exam Counseled on 150 minutes of exercise per week, healthy eating (including decreased daily intake of saturated fats, cholesterol, added sugars, sodium), routine healthcare maintenance.   2. Other hyperlipidemia Likely to be uncontrolled due to noncompliance with atorvastatin Advised to resume atorvastatin Low-cholesterol - atorvastatin (LIPITOR) 20 MG tablet; Take 1 tablet (20 mg total) by mouth daily.  Dispense: 30 tablet; Refill: 6  3. Essential hypertension Uncontrolled due to running out of antihypertensive which I have refilled Counseled on blood pressure goal of less than 130/80, low-sodium, DASH diet, medication compliance, 150 minutes of moderate intensity exercise per week. Discussed medication compliance, adverse effects. - CMP14+EGFR - hydrochlorothiazide (HYDRODIURIL) 25 MG tablet; Take 1 tablet (25 mg total) by mouth daily. Must have office visit for refills  Dispense: 30 tablet; Refill: 6  4. Encounter for screening mammogram for malignant neoplasm of breast - MM DIGITAL SCREENING BILATERAL; Future  5. Screening for colon cancer - Fecal occult blood, imunochemical  6. Obesity (BMI 30.0-34.9) Due to being less active and more sedentary She is motivated to start exercising again and cutting out calories and avoiding late meals  7. Pedal edema Likely due to running out of her  antihypertensive which is a diuretic Advised to use ankle support an ankle brace in the event of ankle pain Low-sodium diet   Meds ordered this encounter  Medications  . atorvastatin (LIPITOR) 20 MG tablet    Sig: Take 1 tablet (20 mg total) by mouth daily.    Dispense:  30 tablet    Refill:  6  . hydrochlorothiazide (HYDRODIURIL) 25 MG tablet    Sig: Take 1 tablet (25 mg total) by mouth daily. Must have office visit for refills    Dispense:  30 tablet    Refill:  6    Follow-up: Return in about 3 months (around 03/17/2020) for Chronic disease management.       Charlott Rakes, MD, FAAFP. [facility] and [place], [place]   12/16/2019, 10:45 AM

## 2019-12-16 NOTE — Addendum Note (Signed)
Addended by: Gomez Cleverly on: 12/16/2019 11:13 AM   Modules accepted: Orders

## 2019-12-16 NOTE — Progress Notes (Signed)
Swelling in ankles.

## 2019-12-17 LAB — CBC WITH DIFFERENTIAL/PLATELET
Basophils Absolute: 0.1 10*3/uL (ref 0.0–0.2)
Basos: 1 %
EOS (ABSOLUTE): 0.1 10*3/uL (ref 0.0–0.4)
Eos: 1 %
Hematocrit: 43.8 % (ref 34.0–46.6)
Hemoglobin: 14.7 g/dL (ref 11.1–15.9)
Immature Grans (Abs): 0 10*3/uL (ref 0.0–0.1)
Immature Granulocytes: 0 %
Lymphocytes Absolute: 1.8 10*3/uL (ref 0.7–3.1)
Lymphs: 22 %
MCH: 28.5 pg (ref 26.6–33.0)
MCHC: 33.6 g/dL (ref 31.5–35.7)
MCV: 85 fL (ref 79–97)
Monocytes Absolute: 0.5 10*3/uL (ref 0.1–0.9)
Monocytes: 6 %
Neutrophils Absolute: 6 10*3/uL (ref 1.4–7.0)
Neutrophils: 70 %
Platelets: 225 10*3/uL (ref 150–450)
RBC: 5.16 x10E6/uL (ref 3.77–5.28)
RDW: 13.1 % (ref 11.7–15.4)
WBC: 8.5 10*3/uL (ref 3.4–10.8)

## 2019-12-17 LAB — CMP14+EGFR
ALT: 13 IU/L (ref 0–32)
AST: 14 IU/L (ref 0–40)
Albumin/Globulin Ratio: 1.8 (ref 1.2–2.2)
Albumin: 4.2 g/dL (ref 3.8–4.9)
Alkaline Phosphatase: 113 IU/L (ref 44–121)
BUN/Creatinine Ratio: 15 (ref 9–23)
BUN: 16 mg/dL (ref 6–24)
Bilirubin Total: 0.6 mg/dL (ref 0.0–1.2)
CO2: 25 mmol/L (ref 20–29)
Calcium: 10.9 mg/dL — ABNORMAL HIGH (ref 8.7–10.2)
Chloride: 102 mmol/L (ref 96–106)
Creatinine, Ser: 1.05 mg/dL — ABNORMAL HIGH (ref 0.57–1.00)
GFR calc Af Amer: 69 mL/min/{1.73_m2} (ref >59)
GFR calc non Af Amer: 60 mL/min/{1.73_m2} (ref >59)
Globulin, Total: 2.3 g/dL (ref 1.5–4.5)
Glucose: 89 mg/dL (ref 65–99)
Potassium: 4.6 mmol/L (ref 3.5–5.2)
Sodium: 137 mmol/L (ref 134–144)
Total Protein: 6.5 g/dL (ref 6.0–8.5)

## 2019-12-17 LAB — T4, FREE: Free T4: 1.19 ng/dL (ref 0.82–1.77)

## 2019-12-17 LAB — TSH: TSH: 1.27 u[IU]/mL (ref 0.450–4.500)

## 2019-12-19 ENCOUNTER — Other Ambulatory Visit: Payer: Self-pay

## 2019-12-19 ENCOUNTER — Other Ambulatory Visit: Payer: Self-pay | Admitting: Family Medicine

## 2019-12-19 LAB — CYTOLOGY - PAP
Comment: NEGATIVE
Diagnosis: NEGATIVE
Diagnosis: REACTIVE
High risk HPV: NEGATIVE

## 2019-12-20 ENCOUNTER — Other Ambulatory Visit: Payer: Self-pay

## 2019-12-23 ENCOUNTER — Other Ambulatory Visit: Payer: Self-pay

## 2019-12-23 ENCOUNTER — Ambulatory Visit: Payer: Self-pay | Attending: Family Medicine

## 2019-12-24 LAB — PTH, INTACT AND CALCIUM
Calcium: 10.8 mg/dL — ABNORMAL HIGH (ref 8.7–10.2)
PTH: 57 pg/mL (ref 15–65)

## 2019-12-25 ENCOUNTER — Ambulatory Visit: Payer: Self-pay | Attending: Family Medicine

## 2019-12-25 ENCOUNTER — Other Ambulatory Visit: Payer: Self-pay

## 2020-01-07 ENCOUNTER — Telehealth: Payer: Self-pay | Admitting: Family Medicine

## 2020-01-07 NOTE — Telephone Encounter (Signed)
Pt was sent a letter from financial dept. Inform them, that the application they submitted was incomplete, since they were missing some documentation at the time of the appointment, Pt need to reschedule and resubmit all new papers and application for CAFA and [place], P.S. old documents has been sent back by mail to the Pt and Pt. need to make a new appt. 

## 2020-01-21 MED FILL — ATORVASTATIN CALCIUM 20 MG: 20 | 30 days supply | Qty: 30 | Fill #1

## 2020-01-22 MED FILL — HYDROCHLOROTHIAZIDE 25 MG T: 25 | 30 days supply | Qty: 30 | Fill #0

## 2020-03-10 ENCOUNTER — Telehealth (HOSPITAL_COMMUNITY): Payer: No Payment, Other

## 2020-03-16 MED FILL — ATORVASTATIN CALCIUM 20 MG: 20 | 30 days supply | Qty: 30 | Fill #2

## 2020-03-17 ENCOUNTER — Encounter: Payer: Self-pay | Admitting: Family Medicine

## 2020-03-17 ENCOUNTER — Ambulatory Visit: Payer: Self-pay | Attending: Family Medicine | Admitting: Family Medicine

## 2020-03-17 ENCOUNTER — Other Ambulatory Visit: Payer: Self-pay | Admitting: Family Medicine

## 2020-03-17 ENCOUNTER — Other Ambulatory Visit: Payer: Self-pay

## 2020-03-17 VITALS — BP 141/86 | HR 85 | Ht 65.0 in | Wt 198.0 lb

## 2020-03-17 DIAGNOSIS — G8929 Other chronic pain: Secondary | ICD-10-CM

## 2020-03-17 DIAGNOSIS — I10 Essential (primary) hypertension: Secondary | ICD-10-CM

## 2020-03-17 DIAGNOSIS — R600 Localized edema: Secondary | ICD-10-CM

## 2020-03-17 DIAGNOSIS — M25562 Pain in left knee: Secondary | ICD-10-CM

## 2020-03-17 DIAGNOSIS — J3089 Other allergic rhinitis: Secondary | ICD-10-CM

## 2020-03-17 MED ORDER — FUROSEMIDE 20 MG PO TABS: 20.0000 mg | ORAL_TABLET | Freq: Every day | ORAL | 3 refills | Status: DC | PRN

## 2020-03-17 MED ORDER — LISINOPRIL 5 MG PO TABS: 5.0000 mg | ORAL_TABLET | Freq: Every day | ORAL | 6 refills | Status: DC

## 2020-03-17 MED ORDER — CETIRIZINE HCL 10 MG PO TABS: 10.0000 mg | ORAL_TABLET | Freq: Every day | ORAL | 1 refills | Status: DC

## 2020-03-17 MED ORDER — DICLOFENAC SODIUM 1 % EX GEL: 4.0000 g | Freq: Four times a day (QID) | CUTANEOUS | 1 refills | Status: DC

## 2020-03-17 MED ORDER — FLUTICASONE PROPIONATE 50 MCG/ACT NA SUSP: 2.0000 | Freq: Every day | NASAL | 6 refills | Status: DC

## 2020-03-17 MED FILL — DICLOFENAC SODIUM 1% GEL: 1 | 6 days supply | Qty: 100 | Fill #0

## 2020-03-17 MED FILL — LISINOPRIL 5 MG TABLET: 5 | 30 days supply | Qty: 30 | Fill #0

## 2020-03-17 MED FILL — FUROSEMIDE 20 MG TABS: 20 | 30 days supply | Qty: 30 | Fill #0

## 2020-03-17 MED FILL — FLUTICASONE PROP 50 MCG SPR: 50 | 30 days supply | Qty: 16 | Fill #0

## 2020-03-17 NOTE — Progress Notes (Signed)
Having trouble with allergies. Has swelling in ankles and left knee.

## 2020-03-17 NOTE — Patient Instructions (Signed)
Edema  Edema is when you have too much fluid in your body or under your skin. Edema may make your legs, feet, and ankles swell up. Swelling is also common in looser tissues, like around your eyes. This is a common condition. It gets more common as you get older. There are many possible causes of edema. Eating too much salt (sodium) and being on your feet or sitting for a long time can cause edema in your legs, feet, and ankles. Hot weather may make edema worse. Edema is usually painless. Your skin may look swollen or shiny. Follow these instructions at home:  Keep the swollen body part raised (elevated) above the level of your heart when you are sitting or lying down.  Do not sit still or stand for a long time.  Do not wear tight clothes. Do not wear garters on your upper legs.  Exercise your legs. This can help the swelling go down.  Wear elastic bandages or support stockings as told by your doctor.  Eat a low-salt (low-sodium) diet to reduce fluid as told by your doctor.  Depending on the cause of your swelling, you may need to limit how much fluid you drink (fluid restriction).  Take over-the-counter and prescription medicines only as told by your doctor. Contact a doctor if:  Treatment is not working.  You have heart, liver, or kidney disease and have symptoms of edema.  You have sudden and unexplained weight gain. Get help right away if:  You have shortness of breath or chest pain.  You cannot breathe when you lie down.  You have pain, redness, or warmth in the swollen areas.  You have heart, liver, or kidney disease and get edema all of a sudden.  You have a fever and your symptoms get worse all of a sudden. Summary  Edema is when you have too much fluid in your body or under your skin.  Edema may make your legs, feet, and ankles swell up. Swelling is also common in looser tissues, like around your eyes.  Raise (elevate) the swollen body part above the level of your  heart when you are sitting or lying down.  Follow your doctor's instructions about diet and how much fluid you can drink (fluid restriction). This information is not intended to replace advice given to you by your health care provider. Make sure you discuss any questions you have with your health care provider. Document Revised: 11/27/2019 Document Reviewed: 11/27/2019 Elsevier Patient Education  2021 Else[employer] Inc.  

## 2020-03-17 NOTE — Progress Notes (Signed)
Subjective:  Patient ID: Stacy Barrett, female    DOB: 1965-03-29  Age: 56 y.o. MRN: [id number]  CC: Hypertension   HPI Stacy Barrett  is a 55 year old female with history of hypertension, hyperlipidemia, tobacco abuse, anxiety who presents for follow-up visit.  She complains of swelling in her ankles and left knee for the last 2-3 months. She had sprained her ankles about 8 years ago. She has had an old L knee injury. L knee pain is in the lateral aspect with edema behind her knee. Swelling is noticed at the end of the day. Pain also shoots from ankles up to her lateral legs and is worse with weightbearing. Denies presence of dyspnea but she has gained some weight which she attributes to being sedentary.  Also complains of problems with her allergies with post nasal drip after doing yard work.  She used an OTC nasal spray with no much relief. Compliant with her antihypertensive and her statin. She is concerned that her last blood work revealed hypercalcemia.  Past Medical History:  Diagnosis Date  . Anxiety   . Depression   . Edema   . Hypertension   . Lumbar radiculopathy   . Pituitary adenoma ([place])   . Vertebral fracture     No past surgical history on file.  Family History  Problem Relation Age of Onset  . CAD Mother     Allergies  Allergen Reactions  . Other Other (See Comments)    MSG -Very intense headaches  . Penicillins Hives  . Prednisone Itching, Swelling and Rash    Swelling of lips and face  . Sulfa Antibiotics Hives and Itching    Outpatient Medications Prior to Visit  Medication Sig Dispense Refill  . ARIPiprazole (ABILIFY) 5 MG tablet Take 1 tablet (5 mg total) by mouth daily. 30 tablet 2  . atorvastatin (LIPITOR) 20 MG tablet Take 1 tablet (20 mg total) by mouth daily. 30 tablet 6  . busPIRone (BUSPAR) 10 MG tablet Take 1 tablet (10 mg total) by mouth 2 (two) times daily. 60 tablet 2  . gabapentin (NEURONTIN) 300 MG capsule Take 1 capsule (300 mg  total) by mouth 3 (three) times daily. 90 capsule 2  . hydrochlorothiazide (HYDRODIURIL) 25 MG tablet Take 1 tablet (25 mg total) by mouth daily. Must have office visit for refills 30 tablet 6  . sertraline (ZOLOFT) 100 MG tablet Take 1.5 tablets (150 mg total) by mouth daily. 45 tablet 2  . Vitamin D, Ergocalciferol, (DRISDOL) 1.25 MG (50000 UNIT) CAPS capsule Take 1 capsule (50,000 Units total) by mouth every 7 (seven) days. 16 capsule 0   No facility-administered medications prior to visit.     ROS Review of Systems  Constitutional: Negative for activity change, appetite change and fatigue.  HENT: Negative for congestion, sinus pressure and sore throat.   Eyes: Negative for visual disturbance.  Respiratory: Negative for cough, chest tightness, shortness of breath and wheezing.   Cardiovascular: Negative for chest pain and palpitations.  Gastrointestinal: Negative for abdominal distention, abdominal pain and constipation.  Endocrine: Negative for polydipsia.  Genitourinary: Negative for dysuria and frequency.  Musculoskeletal:       See HPI  Skin: Negative for rash.  Neurological: Negative for tremors, light-headedness and numbness.  Hematological: Does not bruise/bleed easily.  Psychiatric/Behavioral: Negative for agitation and behavioral problems.    Objective:  BP (!) 141/86   Pulse 85   Ht 5\' 5"  (1.651 m)   Wt 198 lb (89.8  kg)   SpO2 99%   BMI 32.95 kg/m   BP/Weight 03/17/2020 12/16/2019 [id number]  Systolic BP [id number]  Diastolic BP 86 96 84  Wt. (Lbs) 198 192 167  BMI 32.95 31.95 27.79  Some encounter information is confidential and restricted. Go to Review Flowsheets activity to see all data.      Physical Exam Constitutional:      Appearance: She is well-developed.  Neck:     Vascular: No JVD.  Cardiovascular:     Rate and Rhythm: Normal rate.     Heart sounds: Normal heart sounds. No murmur heard.   Pulmonary:     Effort: Pulmonary effort is normal.      Breath sounds: Normal breath sounds. No wheezing or rales.  Chest:     Chest wall: No tenderness.  Abdominal:     General: Bowel sounds are normal. There is no distension.     Palpations: Abdomen is soft. There is no mass.     Tenderness: There is no abdominal tenderness.  Musculoskeletal:        General: No tenderness. Normal range of motion.     Right lower leg: No edema.     Left lower leg: No edema.     Comments: Full range of motion of both ankles with no tenderness elicited Full range of motion of both knees with no tenderness elicited  Neurological:     Mental Status: She is alert and oriented to person, place, and time.  Psychiatric:        Mood and Affect: Mood normal.     CMP Latest Ref Rng & Units 12/23/2019 12/16/2019 12/06/2017  Glucose 65 - 99 mg/dL - 89 78  BUN 6 - 24 mg/dL - 16 16  Creatinine 0.57 - 1.00 mg/dL - 1.05(H) 0.76  Sodium 134 - 144 mmol/L - 137 142  Potassium 3.5 - 5.2 mmol/L - 4.6 4.7  Chloride 96 - 106 mmol/L - 102 101  CO2 20 - 29 mmol/L - 25 27  Calcium 8.7 - 10.2 mg/dL 10.8(H) 10.9(H) 11.3(H)  Total Protein 6.0 - 8.5 g/dL - 6.5 7.1  Total Bilirubin 0.0 - 1.2 mg/dL - 0.6 0.8  Alkaline Phos 44 - 121 IU/L - 113 80  AST 0 - 40 IU/L - 14 15  ALT 0 - 32 IU/L - 13 21    Lipid Panel     Component Value Date/Time   CHOL 155 05/22/2017 1136   TRIG 124 05/22/2017 1136   HDL 47 05/22/2017 1136   CHOLHDL 3.3 05/22/2017 1136   CHOLHDL 8.0 (H) 10/28/2015 1104   VLDL 60 (H) 10/28/2015 1104   LDLCALC 83 05/22/2017 1136    CBC    Component Value Date/Time   WBC 8.5 12/16/2019 1057   WBC 9.3 12/18/2014 1853   RBC 5.16 12/16/2019 1057   RBC 5.18 (H) 12/18/2014 1853   HGB 14.7 12/16/2019 1057   HCT 43.8 12/16/2019 1057   PLT 225 12/16/2019 1057   MCV 85 12/16/2019 1057   MCH 28.5 12/16/2019 1057   MCH 29.7 12/18/2014 1853   MCHC 33.6 12/16/2019 1057   MCHC 35.6 12/18/2014 1853   RDW 13.1 12/16/2019 1057   LYMPHSABS 1.8 12/16/2019 1057    EOSABS 0.1 12/16/2019 1057   BASOSABS 0.1 12/16/2019 1057    Lab Results  Component Value Date   HGBA1C 5.7 (H) 10/08/2013    Assessment & Plan:  1. Pedal edema Could be dependent edema Advised to elevate  feet Use compression stockings - furosemide (LASIX) 20 MG tablet; Take 1 tablet (20 mg total) by mouth daily as needed for edema.  Dispense: 30 tablet; Refill: 3  2. Essential hypertension Slightly above goal She has been out of her antihypertensive Switch from hydrochlorothiazide to lisinopril due to hypercalcemia which can be associated with the former Counseled on blood pressure goal of less than 130/80, low-sodium, DASH diet, medication compliance, 150 minutes of moderate intensity exercise per week. Discussed medication compliance, adverse effects. - lisinopril (ZESTRIL) 5 MG tablet; Take 1 tablet (5 mg total) by mouth daily.  Dispense: 30 tablet; Refill: 6 - Basic Metabolic Panel; Future  3. Non-seasonal allergic rhinitis due to other allergic trigger Placed on Zyrtec and Flonase - cetirizine (ZYRTEC) 10 MG tablet; Take 1 tablet (10 mg total) by mouth daily.  Dispense: 30 tablet; Refill: 1 - fluticasone (FLONASE) 50 MCG/ACT nasal spray; Place 2 sprays into both nostrils daily.  Dispense: 16 g; Refill: 6  4. Hypercalcemia Likely secondary to HCTZ which has been discontinued  5. Chronic pain of left knee She would rather use a topical NSAID Placed on Voltaren gel and if symptoms persist consider imaging - diclofenac Sodium (VOLTAREN) 1 % GEL; Apply 4 g topically 4 (four) times daily.  Dispense: 100 g; Refill: 1   Meds ordered this encounter  Medications  . cetirizine (ZYRTEC) 10 MG tablet    Sig: Take 1 tablet (10 mg total) by mouth daily.    Dispense:  30 tablet    Refill:  1  . fluticasone (FLONASE) 50 MCG/ACT nasal spray    Sig: Place 2 sprays into both nostrils daily.    Dispense:  16 g    Refill:  6  . diclofenac Sodium (VOLTAREN) 1 % GEL    Sig: Apply 4 g  topically 4 (four) times daily.    Dispense:  100 g    Refill:  1  . furosemide (LASIX) 20 MG tablet    Sig: Take 1 tablet (20 mg total) by mouth daily as needed for edema.    Dispense:  30 tablet    Refill:  3  . lisinopril (ZESTRIL) 5 MG tablet    Sig: Take 1 tablet (5 mg total) by mouth daily.    Dispense:  30 tablet    Refill:  6    Discontinue hydrochlorothiazide    Follow-up: Return in about 6 months (around 09/14/2020) for Medical conditions.       Charlott Rakes, MD, FAAFP. [facility] and [place], [place]   03/17/2020, 4:07 PM

## 2020-04-14 ENCOUNTER — Other Ambulatory Visit: Payer: Self-pay

## 2020-04-16 ENCOUNTER — Other Ambulatory Visit: Payer: Self-pay

## 2020-04-16 ENCOUNTER — Ambulatory Visit: Payer: Self-pay | Attending: Family Medicine

## 2020-04-16 DIAGNOSIS — I10 Essential (primary) hypertension: Secondary | ICD-10-CM

## 2020-04-17 LAB — BASIC METABOLIC PANEL
BUN/Creatinine Ratio: 19 (ref 9–23)
BUN: 17 mg/dL (ref 6–24)
CO2: 24 mmol/L (ref 20–29)
Calcium: 10.5 mg/dL — ABNORMAL HIGH (ref 8.7–10.2)
Chloride: 104 mmol/L (ref 96–106)
Creatinine, Ser: 0.88 mg/dL (ref 0.57–1.00)
Glucose: 87 mg/dL (ref 65–99)
Potassium: 4.6 mmol/L (ref 3.5–5.2)
Sodium: 142 mmol/L (ref 134–144)
eGFR: 77 mL/min/{1.73_m2} (ref >59)

## 2020-04-20 ENCOUNTER — Other Ambulatory Visit (HOSPITAL_COMMUNITY): Payer: Self-pay | Admitting: Psychiatry

## 2020-04-20 ENCOUNTER — Encounter (HOSPITAL_COMMUNITY): Payer: Self-pay

## 2020-04-20 ENCOUNTER — Telehealth (INDEPENDENT_AMBULATORY_CARE_PROVIDER_SITE_OTHER): Payer: No Payment, Other | Admitting: Psychiatry

## 2020-04-20 ENCOUNTER — Other Ambulatory Visit: Payer: Self-pay

## 2020-04-20 DIAGNOSIS — F331 Major depressive disorder, recurrent, moderate: Secondary | ICD-10-CM

## 2020-04-20 MED ORDER — BUSPIRONE HCL 10 MG PO TABS: 10.0000 mg | ORAL_TABLET | Freq: Three times a day (TID) | ORAL | 2 refills | Status: DC

## 2020-04-20 MED ORDER — SERTRALINE HCL 100 MG PO TABS: 150.0000 mg | ORAL_TABLET | Freq: Every day | ORAL | 2 refills | Status: DC

## 2020-04-20 MED ORDER — ARIPIPRAZOLE 5 MG PO TABS: 10.0000 mg | ORAL_TABLET | Freq: Every day | ORAL | 2 refills | Status: DC
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
## 2020-05-05 ENCOUNTER — Other Ambulatory Visit (HOSPITAL_COMMUNITY): Payer: Self-pay | Admitting: Psychiatry

## 2020-05-05 MED FILL — LISINOPRIL 5 MG TABLET: 5 | 30 days supply | Qty: 30 | Fill #1

## 2020-05-05 MED FILL — ATORVASTATIN CALCIUM 20 MG: 20 | 30 days supply | Qty: 30 | Fill #0

## 2020-05-05 MED FILL — FUROSEMIDE 20 MG TABS: 20 | 30 days supply | Qty: 30 | Fill #1

## 2020-05-07 ENCOUNTER — Other Ambulatory Visit (HOSPITAL_COMMUNITY): Payer: Self-pay | Admitting: Psychiatry

## 2020-05-16 ENCOUNTER — Other Ambulatory Visit: Payer: Self-pay

## 2020-06-16 MED FILL — Fluticasone Propionate Nasal Susp 50 MCG/ACT: NASAL | 30 days supply | Qty: 16 | Fill #0 | Status: AC

## 2020-06-16 MED FILL — Furosemide Tab 20 MG: ORAL | 30 days supply | Qty: 30 | Fill #0 | Status: AC

## 2020-06-16 MED FILL — Lisinopril Tab 5 MG: ORAL | 30 days supply | Qty: 30 | Fill #0 | Status: AC

## 2020-06-16 MED FILL — Ergocalciferol Cap 1.25 MG (50000 Unit): ORAL | 28 days supply | Qty: 4 | Fill #0 | Status: AC

## 2020-06-17 ENCOUNTER — Other Ambulatory Visit: Payer: Self-pay

## 2020-06-18 ENCOUNTER — Other Ambulatory Visit: Payer: Self-pay

## 2020-06-22 ENCOUNTER — Other Ambulatory Visit: Payer: Self-pay

## 2020-06-22 ENCOUNTER — Encounter (HOSPITAL_COMMUNITY): Payer: No Payment, Other | Admitting: Psychiatry

## 2020-07-20 NOTE — Progress Notes (Signed)
This encounter was created in error - please disregard.  This encounter was created in error - please disregard.

## 2020-08-10 ENCOUNTER — Other Ambulatory Visit: Payer: Self-pay

## 2020-08-10 ENCOUNTER — Other Ambulatory Visit (HOSPITAL_COMMUNITY): Payer: Self-pay | Admitting: Psychiatry

## 2020-08-10 DIAGNOSIS — E559 Vitamin D deficiency, unspecified: Secondary | ICD-10-CM

## 2020-08-10 MED FILL — Furosemide Tab 20 MG: ORAL | 30 days supply | Qty: 30 | Fill #1 | Status: CN

## 2020-08-10 MED FILL — Lisinopril Tab 5 MG: ORAL | 30 days supply | Qty: 30 | Fill #1 | Status: CN

## 2020-08-14 ENCOUNTER — Other Ambulatory Visit: Payer: Self-pay

## 2020-08-14 ENCOUNTER — Other Ambulatory Visit (HOSPITAL_COMMUNITY): Payer: Self-pay | Admitting: Psychiatry

## 2020-08-14 DIAGNOSIS — E559 Vitamin D deficiency, unspecified: Secondary | ICD-10-CM

## 2020-08-16 MED ORDER — VITAMIN D (ERGOCALCIFEROL) 1.25 MG (50000 UNIT) PO CAPS
ORAL_CAPSULE | ORAL | 0 refills | Status: DC
  Filled 2020-08-16 – 2020-09-01 (×2): qty 4, 28d supply, fill #0
  Filled 2020-10-13: qty 4, 28d supply, fill #1
  Filled 2020-11-19: qty 4, 28d supply, fill #2
  Filled 2021-02-24: qty 4, 28d supply, fill #3
  Filled 2021-02-24 – 2021-03-03 (×2): qty 4, 28d supply, fill #0

## 2020-08-18 ENCOUNTER — Other Ambulatory Visit: Payer: Self-pay

## 2020-08-25 ENCOUNTER — Other Ambulatory Visit: Payer: Self-pay

## 2020-08-31 ENCOUNTER — Other Ambulatory Visit: Payer: Self-pay

## 2020-09-01 ENCOUNTER — Other Ambulatory Visit: Payer: Self-pay

## 2020-09-01 ENCOUNTER — Other Ambulatory Visit (HOSPITAL_COMMUNITY): Payer: Self-pay | Admitting: Psychiatry

## 2020-09-01 MED FILL — Furosemide Tab 20 MG: ORAL | 30 days supply | Qty: 30 | Fill #1 | Status: AC

## 2020-09-01 MED FILL — Sertraline HCl Tab 100 MG: ORAL | 30 days supply | Qty: 45 | Fill #0 | Status: AC

## 2020-09-01 MED FILL — Lisinopril Tab 5 MG: ORAL | 30 days supply | Qty: 30 | Fill #1 | Status: AC

## 2020-09-03 ENCOUNTER — Other Ambulatory Visit: Payer: Self-pay

## 2020-10-08 ENCOUNTER — Other Ambulatory Visit (HOSPITAL_COMMUNITY): Payer: Self-pay | Admitting: Family Medicine

## 2020-10-08 ENCOUNTER — Other Ambulatory Visit: Payer: Self-pay

## 2020-10-08 MED FILL — Furosemide Tab 20 MG: ORAL | 30 days supply | Qty: 30 | Fill #2 | Status: AC

## 2020-10-08 MED FILL — Lisinopril Tab 5 MG: ORAL | 30 days supply | Qty: 30 | Fill #2 | Status: AC

## 2020-10-08 MED FILL — Buspirone HCl Tab 10 MG: ORAL | 30 days supply | Qty: 90 | Fill #0 | Status: AC

## 2020-10-08 NOTE — Telephone Encounter (Signed)
Requested medication (s) are due for refill today - yes  Requested medication (s) are on the active medication list -yes  Future visit scheduled -no  Last refill: 04/18/20 #30 2RF  Notes to clinic: Request RF: outside provider  Requested Prescriptions  Pending Prescriptions Disp Refills   ARIPiprazole (ABILIFY) 5 MG tablet 30 tablet 2    Sig: TAKE 1 TABLET (5 MG TOTAL) BY MOUTH DAILY.     There is no refill protocol information for this order    Refused Prescriptions Disp Refills   sertraline (ZOLOFT) 100 MG tablet 45 tablet 2    Sig: TAKE 1.5 TABLETS (150 MG TOTAL) BY MOUTH DAILY.     There is no refill protocol information for this order       Requested Prescriptions  Pending Prescriptions Disp Refills   ARIPiprazole (ABILIFY) 5 MG tablet 30 tablet 2    Sig: TAKE 1 TABLET (5 MG TOTAL) BY MOUTH DAILY.     There is no refill protocol information for this order    Refused Prescriptions Disp Refills   sertraline (ZOLOFT) 100 MG tablet 45 tablet 2    Sig: TAKE 1.5 TABLETS (150 MG TOTAL) BY MOUTH DAILY.     There is no refill protocol information for this order

## 2020-10-08 NOTE — Telephone Encounter (Signed)
Per medication record: sertraline RF- outside provider: 09/12/20 #45 2RF- too soon

## 2020-10-09 ENCOUNTER — Other Ambulatory Visit: Payer: Self-pay

## 2020-10-12 ENCOUNTER — Other Ambulatory Visit: Payer: Self-pay

## 2020-10-12 MED FILL — Cetirizine HCl Tab 10 MG: ORAL | Qty: 30 | Fill #0 | Status: CN

## 2020-10-12 MED FILL — Fluticasone Propionate Nasal Susp 50 MCG/ACT: NASAL | 30 days supply | Qty: 16 | Fill #1 | Status: AC

## 2020-10-13 ENCOUNTER — Other Ambulatory Visit: Payer: Self-pay

## 2020-10-13 MED FILL — Diclofenac Sodium Gel 1% (1.16% Diethylamine Equiv): CUTANEOUS | 6 days supply | Qty: 100 | Fill #0 | Status: AC

## 2020-10-13 NOTE — Telephone Encounter (Addendum)
Patient states she's  completely out of her ARIPiprazole (ABILIFY) 5 MG tablet, sertraline (ZOLOFT) 100 MG tablet and would like PCP to expedited. Patient states without her medication she goes into a deep into depression. 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   [facility] Phone:  [phone]  Fax:  [phone]

## 2020-10-13 NOTE — Telephone Encounter (Signed)
Will route to prescribing provider- NP Lord.

## 2020-10-14 ENCOUNTER — Other Ambulatory Visit: Payer: Self-pay

## 2020-11-19 ENCOUNTER — Other Ambulatory Visit (HOSPITAL_COMMUNITY): Payer: Self-pay | Admitting: Psychiatry

## 2020-11-19 MED FILL — Buspirone HCl Tab 10 MG: ORAL | 30 days supply | Qty: 90 | Fill #1 | Status: CN

## 2020-11-19 MED FILL — Lisinopril Tab 5 MG: ORAL | 30 days supply | Qty: 30 | Fill #3 | Status: AC

## 2020-11-20 ENCOUNTER — Other Ambulatory Visit: Payer: Self-pay

## 2020-11-23 ENCOUNTER — Other Ambulatory Visit: Payer: Self-pay

## 2020-11-26 ENCOUNTER — Other Ambulatory Visit: Payer: Self-pay

## 2021-01-27 ENCOUNTER — Encounter (HOSPITAL_COMMUNITY): Payer: Self-pay | Admitting: Registered Nurse

## 2021-01-27 ENCOUNTER — Ambulatory Visit (HOSPITAL_COMMUNITY)
Admission: EM | Admit: 2021-01-27 | Discharge: 2021-01-27 | Disposition: A | Discharge status: Home / Self Care | Payer: No Payment, Other | Attending: Registered Nurse | Admitting: Registered Nurse

## 2021-01-27 DIAGNOSIS — Z79899 Other long term (current) drug therapy: Secondary | ICD-10-CM | POA: Insufficient documentation

## 2021-01-27 DIAGNOSIS — R030 Elevated blood-pressure reading, without diagnosis of hypertension: Secondary | ICD-10-CM | POA: Insufficient documentation

## 2021-01-27 DIAGNOSIS — Z760 Encounter for issue of repeat prescription: Secondary | ICD-10-CM | POA: Insufficient documentation

## 2021-01-27 DIAGNOSIS — F411 Generalized anxiety disorder: Secondary | ICD-10-CM | POA: Diagnosis present

## 2021-01-27 DIAGNOSIS — F331 Major depressive disorder, recurrent, moderate: Secondary | ICD-10-CM | POA: Diagnosis present

## 2021-01-27 MED ORDER — SERTRALINE HCL 100 MG PO TABS: 150.0000 mg | ORAL_TABLET | Freq: Every day | ORAL | 0 refills | Status: DC

## 2021-01-27 NOTE — Progress Notes (Signed)
Patient presents to the [facility] and states that she is having problems with anxiety.  Patient states that she was formerly a patient at [facility] and was prescribed Abilify, but states that she has to discontinue the medication. She was also prescribed Zoloft, Gabapentin and Buspirone.  Patient states that seven to eight years ago that her mother was diagnosed with dementia and she had to move in with her.  She states that her mother's condition was much worse than she thought and she states that she has been feeling overwhelmed extremely anxious.  Patient states that in the process of caring for her mother that she lost her job.  Patient states that she recently lost her father in February of this year.  Patient is depressed and tearful, but she denies SI/HI/Psychosis.  Patient denies any substance use.  Patient states that she has not been sleeping well. Patient does not feel like she needs to be hospitalized, but states that she needs to ge back on her medications.

## 2021-01-27 NOTE — ED Notes (Signed)
Discharge instructions provided and Pt stated understanding. Pt alert, orient and ambulatory prior to d/c from facility. Personal belongings returned. Safety maintained.  

## 2021-01-27 NOTE — ED Provider Notes (Addendum)
Behavioral Health Urgent Care Medical Screening Exam  Patient Name: Stacy Barrett MRN: [id number] Date of Evaluation: 01/27/21 Chief Complaint:   Diagnosis:  Final diagnoses:  MDD (major depressive disorder), recurrent episode, moderate (HCC)  GAD (generalized anxiety disorder)    History of Present illness: Stacy Barrett is a 55 y.o. female. patient presented to [facility] as a walk in with complaints of worsening anxiety, depression, and running out of her medication  Stacy Barrett, 55 y.o., female patient seen face to face by this provider, consulted with Dr. Ernie Hew; and chart reviewed on 01/27/21.  On evaluation Stacy Barrett reports she is a former patient of [facility] Sessions and has also been seen at [facility].  Reports she is taking Zoloft, Buspar, Gabapentin, and Abilify. Reports she stop taking the Abilify related to reading on Internet that it can cause problems with prolactin, and she has a history of pituitary problems and didn't want any thing that would interfere with that. States she took her last Zoloft today.  States that her primary care provider gave prescriptions for her Gabapentin and Buspar but none of her other medications.  The last time I ran out of my Zoloft I had to be hospitalized because of my anxiety.  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  Patient states she had planned on coming to [facility] before her medication ran out but never had a chance.  States she can come to Open [facility] to walk in appointment to do intake for medication management and therapy intake.   During evaluation Stacy Barrett is sitting up right in chair in no acute distress.  She is alert/oriented x 4; calm/cooperative; and mood congruent with affect.  She is speaking in a clear tone at moderate volume, and normal pace; with good eye contact.  /Her thought process is coherent and relevant; There is no indication that she is currently responding to internal/external stimuli or experiencing  delusional thought content; and she has denied suicidal/self-harm/homicidal ideation, psychosis, and paranoia.   Patient has remained calm throughout assessment and has answered questions appropriately.  Patient encouraged to come to [facility] open access prior to holiday.    At this time Stacy Barrett is educated and verbalizes understanding of mental health resources and other crisis services in the community. She is instructed to call 911 and present to the nearest emergency room should she experience any suicidal/homicidal ideation, auditory/visual/hallucinations, or detrimental worsening of her mental health condition.     Psychiatric Specialty Exam  Presentation  General Appearance:Appropriate for Environment; Casual  Eye Contact:Good  Speech:Clear and Coherent; Normal Rate  Speech Volume:Normal  Handedness:Right   Mood and Affect  Mood:Anxious; Depressed  Affect:Congruent   Thought Process  Thought Processes:Coherent; Goal Directed  Descriptions of Associations:Intact  Orientation:Full (Time, Place and Person)  Thought Content:Logical; WDL    Hallucinations:None  Ideas of Reference:None  Suicidal Thoughts:No  Homicidal Thoughts:No   Sensorium  Memory:Immediate Good; Recent Good; Remote Good  Judgment:Intact  Insight:Present   Executive Functions  Concentration:Good  Attention Span:Good  [place] of Knowledge:Good  Language:Good   Psychomotor Activity  Psychomotor Activity:Normal   Assets  Assets:Communication Skills; Desire for Improvement; Housing; Leisure Time; Resilience; Social Support   Sleep  Sleep:Fair  Number of hours: No data recorded  Nutritional Assessment (For OBS and FBC admissions only) Has the patient had a weight loss or gain of 10 pounds or more in the last 3 months?:  No Has the patient had a decrease in food intake/or appetite?: No Does the patient have dental problems?: No Does the patient have eating  habits or behaviors that may be indicators of an eating disorder including binging or inducing vomiting?: No Has the patient recently lost weight without trying?: 0 Has the patient been eating poorly because of a decreased appetite?: 0 Malnutrition Screening Tool Score: 0    Physical Exam: Physical Exam Vitals and nursing note reviewed. Exam conducted with a chaperone present.  Constitutional:      General: She is not in acute distress.    Appearance: Normal appearance. She is not ill-appearing.  HENT:     Head: Normocephalic.  Eyes:     Pupils: Pupils are equal, round, and reactive to light.  Cardiovascular:     Rate and Rhythm: Tachycardia present.     Comments: Elevated blood pressure; states related to anxiety/stress.  Referred to primary doctor for follow up  Pulmonary:     Effort: Pulmonary effort is normal.  Musculoskeletal:        General: Normal range of motion.     Cervical back: Normal range of motion.  Skin:    General: Skin is warm and dry.  Neurological:     Mental Status: She is alert and oriented to person, place, and time.  Psychiatric:        Attention and Perception: Attention and perception normal.        Mood and Affect: Mood is anxious and depressed. Affect is tearful.        Speech: Speech normal.        Behavior: Behavior normal. Behavior is cooperative.        Thought Content: Thought content normal. Thought content is not paranoid or delusional. Thought content does not include homicidal or suicidal ideation.        Cognition and Memory: Cognition and memory normal.        Judgment: Judgment normal.   Review of Systems  Constitutional: Negative.   HENT: Negative.    Eyes: Negative.   Respiratory: Negative.    Cardiovascular: Negative.   Gastrointestinal: Negative.   Genitourinary: Negative.   Musculoskeletal: Negative.   Skin: Negative.   Neurological: Negative.   Endo/Heme/Allergies: Negative.   Psychiatric/Behavioral:  Positive for  depression. Negative for hallucinations, substance abuse and suicidal ideas. The patient is nervous/anxious and has insomnia.   Blood pressure (!) 159/100, pulse (!) 101, temperature 98.8 F (37.1 C), temperature source Oral, resp. rate 18, SpO2 99 %. There is no height or weight on file to calculate BMI.  Musculoskeletal: Strength & Muscle Tone: within normal limits Gait & Station: normal Patient leans: N/A   [place] MSE Discharge Disposition for Follow up and Recommendations: Based on my evaluation the patient does not appear to have an emergency medical condition and can be discharged with resources and follow up care in outpatient services for Medication Management and [place].   Specialty: Urgent Care Why: New Therapy walkin:  Monday-Wednesday from 7:30am-12:30pm.  New Medication management walkin Monday-Friday from 7:30 am to 11:00am.  Patient will be taken in the order that they come.  You may not be seen on the same day as walkin. first come first Midwife information: [facility] [place]        Call  [place].   Specialty: Professional Counselor Why:  schedule an appointment for medication management and therapy Contact information: [facility] of the [place] [address] [phone]         Call  [facility].   Why: In-office and online appointments available Contact information: [place] [address] [phone]                   Discharge Instructions      Follow up with your primary doctor related to high blood pressure      Cheryle Dark, NP 01/27/2021, 4:38 PM

## 2021-01-27 NOTE — Discharge Instructions (Addendum)
Follow up with your primary doctor related to high blood pressure

## 2021-02-08 ENCOUNTER — Telehealth (HOSPITAL_COMMUNITY): Payer: Self-pay | Admitting: Family Medicine

## 2021-02-08 NOTE — BH Assessment (Signed)
Care Management - [facility] Follow Up Discharges  ° °Writer attempted to make contact with patient today and was unsuccessful.  Writer left a HIPPA compliant voice message.  ° °Per chart review, patient was provided with outpatient resources. ° °

## 2021-02-24 ENCOUNTER — Other Ambulatory Visit: Payer: Self-pay

## 2021-02-24 ENCOUNTER — Other Ambulatory Visit: Payer: Self-pay | Admitting: Family Medicine

## 2021-02-24 DIAGNOSIS — R600 Localized edema: Secondary | ICD-10-CM

## 2021-02-24 MED FILL — Lisinopril Tab 5 MG: ORAL | 30 days supply | Qty: 30 | Fill #0 | Status: CN

## 2021-02-24 MED FILL — Lisinopril Tab 5 MG: ORAL | 30 days supply | Qty: 30 | Fill #4 | Status: CN

## 2021-02-24 MED FILL — Fluticasone Propionate Nasal Susp 50 MCG/ACT: NASAL | 30 days supply | Qty: 16 | Fill #0 | Status: CN

## 2021-02-25 ENCOUNTER — Other Ambulatory Visit (HOSPITAL_COMMUNITY): Payer: Self-pay | Admitting: Psychiatry

## 2021-02-25 ENCOUNTER — Telehealth (HOSPITAL_COMMUNITY): Payer: Self-pay | Admitting: Psychiatry

## 2021-02-25 MED ORDER — SERTRALINE HCL 100 MG PO TABS: 150.0000 mg | ORAL_TABLET | Freq: Every day | ORAL | 1 refills | Status: DC

## 2021-02-25 MED ORDER — GABAPENTIN 300 MG PO CAPS: ORAL_CAPSULE | ORAL | 1 refills | Status: DC

## 2021-02-25 MED ORDER — BUSPIRONE HCL 10 MG PO TABS: 10.0000 mg | ORAL_TABLET | Freq: Two times a day (BID) | ORAL | 1 refills | Status: DC

## 2021-02-25 NOTE — Telephone Encounter (Signed)
Patient presented in office to see Theodoro Clock, to restart/refill medications. Patient was last seen by [facility] on 04/20/20. Patient has since been seen at [facility] in December 2022. Patient states that she is out of all medications except zoloft, which was filled by [facility]. Patient reports that she discontinued Abilify after doing research on it. She is agreeable to rescheduling at [facility] next available on 03/23/21. Patient is requesting refills on Zoloft, Gabapentin, and Buspar. Informed patient that this message will be sent to provider and nursing staff to follow up.

## 2021-02-25 NOTE — Progress Notes (Signed)
Rx refilled, appointment on 2/7.  Waylan Boga, PMHNP

## 2021-03-03 ENCOUNTER — Other Ambulatory Visit: Payer: Self-pay

## 2021-03-03 ENCOUNTER — Other Ambulatory Visit: Payer: Self-pay | Admitting: Family Medicine

## 2021-03-03 DIAGNOSIS — E7849 Other hyperlipidemia: Secondary | ICD-10-CM

## 2021-03-03 DIAGNOSIS — R600 Localized edema: Secondary | ICD-10-CM

## 2021-03-03 MED FILL — Lisinopril Tab 5 MG: ORAL | 30 days supply | Qty: 30 | Fill #0 | Status: AC

## 2021-03-03 MED FILL — Fluticasone Propionate Nasal Susp 50 MCG/ACT: NASAL | 30 days supply | Qty: 16 | Fill #0 | Status: AC

## 2021-03-03 NOTE — Telephone Encounter (Signed)
Requested medication (s) are due for refill today: yes  Requested medication (s) are on the active medication list: yes  Last refill:  03/17/20 #30/3  Future visit scheduled: no  Notes to clinic:  pt is overdue for an appt, pt called, mailbox full     Requested Prescriptions  Pending Prescriptions Disp Refills   furosemide (LASIX) 20 MG tablet 30 tablet 3    Sig: TAKE 1 TABLET (20 MG TOTAL) BY MOUTH DAILY AS NEEDED FOR EDEMA.     Cardiovascular:  Diuretics - Loop Failed - 03/03/2021  4:44 PM      Failed - Ca in normal range and within 360 days    Calcium  Date Value Ref Range Status  04/16/2020 10.5 (H) 8.7 - 10.2 mg/dL Final          Failed - Last BP in normal range    BP Readings from Last 1 Encounters:  03/17/20 (!) 141/86          Failed - Valid encounter within last 6 months    Recent Outpatient Visits           11 months ago Pedal edema   [place], [place], MD   1 year ago Annual physical exam   [place], Charlane Ferretti, MD   2 years ago Annual physical exam   [place], Charlane Ferretti, MD   3 years ago Fatigue, unspecified type   [place], [place], [place]   3 years ago [place], [place], MD              Passed - K in normal range and within 360 days    Potassium  Date Value Ref Range Status  04/16/2020 4.6 3.5 - 5.2 mmol/L Final          Passed - Na in normal range and within 360 days    Sodium  Date Value Ref Range Status  04/16/2020 142 134 - 144 mmol/L Final          Passed - Cr in normal range and within 360 days    Creat  Date Value Ref Range Status  10/28/2015 0.64 0.50 - 1.05 mg/dL Final    Comment:      For patients > or = 57 years of age: The upper reference limit for Creatinine is approximately 13% higher for people identified  as African-American.      Creatinine, Ser  Date Value Ref Range Status  04/16/2020 0.88 0.57 - 1.00 mg/dL Final

## 2021-03-03 NOTE — Telephone Encounter (Signed)
Requested medications are due for refill today.  yes  Requested medications are on the active medications list.  yes  Last refill. 12/16/2019  Future visit scheduled.   no  Notes to clinic.  Failed protocol D/t expired labs.     Requested Prescriptions  Pending Prescriptions Disp Refills   atorvastatin (LIPITOR) 20 MG tablet 30 tablet 6    Sig: Take 1 tablet (20 mg total) by mouth daily.     Cardiovascular:  Antilipid - Statins Failed - 03/03/2021 12:22 AM      Failed - Total Cholesterol in normal range and within 360 days    Cholesterol, Total  Date Value Ref Range Status  05/22/2017 155 100 - 199 mg/dL Final          Failed - LDL in normal range and within 360 days    LDL Calculated  Date Value Ref Range Status  05/22/2017 83 0 - 99 mg/dL Final          Failed - HDL in normal range and within 360 days    HDL  Date Value Ref Range Status  05/22/2017 47 >39 mg/dL Final          Failed - Triglycerides in normal range and within 360 days    Triglycerides  Date Value Ref Range Status  05/22/2017 124 0 - 149 mg/dL Final          Passed - Patient is not pregnant      Passed - Valid encounter within last 12 months    Recent Outpatient Visits           11 months ago Pedal edema   [place], Charlane Ferretti, MD   1 year ago Annual physical exam   [place], [place], MD   2 years ago Annual physical exam   [place], [place], MD   3 years ago Fatigue, unspecified type   [place], [place], [place]   3 years ago Anxiety   [facility] Charlott Rakes, MD

## 2021-03-04 ENCOUNTER — Other Ambulatory Visit: Payer: Self-pay

## 2021-03-05 ENCOUNTER — Other Ambulatory Visit: Payer: Self-pay

## 2021-03-09 ENCOUNTER — Encounter (HOSPITAL_COMMUNITY): Payer: Self-pay

## 2021-03-09 ENCOUNTER — Other Ambulatory Visit: Payer: Self-pay

## 2021-03-09 ENCOUNTER — Telehealth (HOSPITAL_COMMUNITY): Payer: No Payment, Other | Admitting: Psychiatry

## 2021-03-13 ENCOUNTER — Other Ambulatory Visit: Payer: Self-pay | Admitting: Family Medicine

## 2021-03-13 DIAGNOSIS — R600 Localized edema: Secondary | ICD-10-CM

## 2021-03-13 NOTE — Telephone Encounter (Signed)
Last RF: 03/17/20 Needs appointment.  MyChart message sent to pt to make appointment. Last appt > 1 year ago  Requested Prescriptions  Pending Prescriptions Disp Refills   furosemide (LASIX) 20 MG tablet 30 tablet 3    Sig: TAKE 1 TABLET (20 MG TOTAL) BY MOUTH DAILY AS NEEDED FOR EDEMA.     Cardiovascular:  Diuretics - Loop Failed - 03/13/2021  1:27 AM      Failed - Ca in normal range and within 360 days    Calcium  Date Value Ref Range Status  04/16/2020 10.5 (H) 8.7 - 10.2 mg/dL Final          Failed - Last BP in normal range    BP Readings from Last 1 Encounters:  03/17/20 (!) 141/86          Failed - Valid encounter within last 6 months    Recent Outpatient Visits           12 months ago Pedal edema   [place], [place], MD   1 year ago Annual physical exam   [place], Charlane Ferretti, MD   2 years ago Annual physical exam   [place], Charlane Ferretti, MD   3 years ago Fatigue, unspecified type   [place], [place], [place]   3 years ago [place], [place], MD              Passed - K in normal range and within 360 days    Potassium  Date Value Ref Range Status  04/16/2020 4.6 3.5 - 5.2 mmol/L Final          Passed - Na in normal range and within 360 days    Sodium  Date Value Ref Range Status  04/16/2020 142 134 - 144 mmol/L Final          Passed - Cr in normal range and within 360 days    Creat  Date Value Ref Range Status  10/28/2015 0.64 0.50 - 1.05 mg/dL Final    Comment:      For patients > or = 57 years of age: The upper reference limit for Creatinine is approximately 13% higher for people identified as African-American.      Creatinine, Ser  Date Value Ref Range Status  04/16/2020 0.88 0.57 - 1.00 mg/dL Final

## 2021-03-17 ENCOUNTER — Other Ambulatory Visit (HOSPITAL_COMMUNITY): Payer: Self-pay

## 2021-03-18 ENCOUNTER — Other Ambulatory Visit: Payer: Self-pay

## 2021-03-18 ENCOUNTER — Ambulatory Visit (INDEPENDENT_AMBULATORY_CARE_PROVIDER_SITE_OTHER): Payer: No Payment, Other | Admitting: Psychiatry

## 2021-03-18 DIAGNOSIS — F411 Generalized anxiety disorder: Secondary | ICD-10-CM | POA: Diagnosis not present

## 2021-03-18 NOTE — Progress Notes (Signed)
[facility] MD/PA/NP OP Progress Note  03/19/2021 12:54 AM Stacy Barrett  MRN:  [id number]  Virtual Visit via Telephone Note  I connected with Stacy Barrett on 03/18/2021 at  1:00 PM EST by telephone and verified that I am speaking with the correct person using two identifiers.  Location: Patient: home Provider: clinic   I discussed the limitations, risks, security and privacy concerns of performing an evaluation and management service by telephone and the availability of in person appointments. I also discussed with the patient that there may be a patient responsible charge related to this service. The patient expressed understanding and agreed to proceed.   I discussed the assessment and treatment plan with the patient. The patient was provided an opportunity to ask questions and all were answered. The patient agreed with the plan and demonstrated an understanding of the instructions.   The patient was advised to call back or seek an in-person evaluation if the symptoms worsen or if the condition fails to improve as anticipated.  I provided 15 minutes of non-face-to-face time during this encounter.   Franne Grip, NP   Chief Complaint: Medication management  HPI:   Stacy Barrett is a 56 year old female presenting to [facility] with reports of worsening anxiety. Patient has a diagnostic history of major depressive disorder, generalized anxiety disorder, and panic disorder. Stacy Barrett symptoms are managed with Buspar, Neurontin, and Zoloft.  Patient believes her medication regimen is effective but reports that she is out of her medication. Patient made aware that refills were completed, e-scribed, and can be picked up at her preferred pharmacy. Patient denies suicidal ideation, homicidal ideations, auditory or visual hallucinations.   Visit Diagnosis:    ICD-10-CM   1. GAD (generalized anxiety disorder)  F41.1       Past Psychiatric History: See  below  Past Medical History:  Past Medical History:  Diagnosis Date   Anxiety    Depression    Edema    Hypertension    Lumbar radiculopathy    Pituitary adenoma ([place])    Vertebral fracture    No past surgical history on file.  Family Psychiatric History: none known  Family History:  Family History  Problem Relation Age of Onset   CAD Mother     Social History:  Social History   Socioeconomic History   Marital status: Unknown    Spouse name: Not on file   Number of children: Not on file   Years of education: Not on file   Highest education level: Not on file  Occupational History   Not on file  Tobacco Use   Smoking status: Every Day    Packs/day: 0.50    Types: Cigarettes   Smokeless tobacco: Never  Vaping Use   Vaping Use: Former  Substance and Sexual Activity   Alcohol use: Yes    Alcohol/week: 1.0 standard drink    Types: 1 Glasses of wine per week    Comment: "hardly ever"   Drug use: Yes    Frequency: 2.0 times per week    Types: Marijuana   Sexual activity: Not on file  Other Topics Concern   Not on file  Social History Narrative   Not on file   Social Determinants of Health   Financial Resource Strain: Not on file  Food Insecurity: Not on file  Transportation Needs: Not on file  Physical Activity: Not on file  Stress: Not on file  Social Connections: Not on file  Allergies:  Allergies  Allergen Reactions   Other Other (See Comments)    MSG -Very intense headaches   Penicillins Hives   Prednisone Itching, Swelling and Rash    Swelling of lips and face   Sulfa Antibiotics Hives and Itching    Metabolic Disorder Labs: Lab Results  Component Value Date   HGBA1C 5.7 (H) 10/08/2013   MPG 117 (H) 10/08/2013   Lab Results  Component Value Date   PROLACTIN 37.5 (H) 10/28/2015   PROLACTIN 36.8 07/18/2014   Lab Results  Component Value Date   CHOL 155 05/22/2017   TRIG 124 05/22/2017   HDL 47 05/22/2017   CHOLHDL 3.3 05/22/2017    VLDL 60 (H) 10/28/2015   LDLCALC 83 05/22/2017   LDLCALC 86 06/14/2016   Lab Results  Component Value Date   TSH 1.270 12/16/2019   TSH 1.130 12/06/2017    Therapeutic Level Labs: Lab Results  Component Value Date   LITHIUM <0.25 (L) 10/03/2013   No results found for: VALPROATE No components found for:  CBMZ  Current Medications: Current Outpatient Medications  Medication Sig Dispense Refill   atorvastatin (LIPITOR) 20 MG tablet Take 1 tablet (20 mg total) by mouth daily. 30 tablet 6   busPIRone (BUSPAR) 10 MG tablet Take 1 tablet (10 mg total) by mouth 2 (two) times daily. 60 tablet 1   cetirizine (ZYRTEC) 10 MG tablet TAKE 1 TABLET (10 MG TOTAL) BY MOUTH DAILY. 30 tablet 1   fluticasone (FLONASE) 50 MCG/ACT nasal spray PLACE 2 SPRAYS INTO BOTH NOSTRILS DAILY. 16 g 6   furosemide (LASIX) 20 MG tablet TAKE 1 TABLET (20 MG TOTAL) BY MOUTH DAILY AS NEEDED FOR EDEMA. 30 tablet 3   gabapentin (NEURONTIN) 300 MG capsule TAKE 1 CAPSULE BY MOUTH THREE TIMES A DAY 90 capsule 1   hydrochlorothiazide (HYDRODIURIL) 25 MG tablet TAKE 1 TABLET (25 MG TOTAL) BY MOUTH DAILY. MUST HAVE OFFICE VISIT FOR REFILLS 30 tablet 6   lisinopril (ZESTRIL) 5 MG tablet TAKE 1 TABLET (5 MG TOTAL) BY MOUTH DAILY. 30 tablet 6   sertraline (ZOLOFT) 100 MG tablet Take 1.5 tablets (150 mg total) by mouth daily. 45 tablet 1   Vitamin D, Ergocalciferol, (DRISDOL) 1.25 MG (50000 UNIT) CAPS capsule Take 1 capsule by mouth every 7 (seven) days. 16 capsule 0   No current facility-administered medications for this visit.     Musculoskeletal: Strength & Muscle Tone: within normal limits Gait & Station: normal Patient leans: N/A  Psychiatric Specialty Exam: Review of Systems  Psychiatric/Behavioral:  Negative for hallucinations and suicidal ideas. The patient is nervous/anxious.   All other systems reviewed and are negative.  There were no vitals taken for this visit.There is no height or weight on file to  calculate BMI.  General Appearance: NA  Eye Contact:  NA  Speech:  Clear and Coherent  Volume:  Normal  Mood:  Anxious  Affect:  NA  Thought Process:  Coherent  Orientation:  Full (Time, Place, and Person)  Thought Content: Logical   Suicidal Thoughts:  No  Homicidal Thoughts:  No  Memory:  Immediate;   Good Recent;   Good Remote;   Good  Judgement:  Good  Insight:  Good  Psychomotor Activity:  NA  Concentration:  Concentration: Good and Attention Span: Good  Recall:  Good  Fund of Knowledge: Good  Language: Good  Akathisia:  NA  Handed:  Right  AIMS (if indicated): not done  Assets:  Communication Skills  Desire for Improvement  ADL's:  Intact  Cognition: WNL  Sleep:  Good   Screenings: AIMS    Flowsheet Row Video Visit from 04/20/2020 in [place] Total Score 0      AUDIT    Flowsheet Row Admission (Discharged) from 10/06/2013 in [facility]  Alcohol Use Disorder Identification Test Final Score (AUDIT) 0      GAD-7    Flowsheet Row Office Visit from 03/17/2020 in [place] Office Visit from 12/16/2019 in [place] Office Visit from 04/05/2018 in [place] Office Visit from 12/06/2017 in [place] Office Visit from 05/19/2017 in [place]  Total GAD-7 Score 4 3 9 8 10       PHQ2-9    Flowsheet Row Video Visit from 04/20/2020 in [facility] Office Visit from 03/17/2020 in [place] Office Visit from 12/16/2019 in [place] Office Visit from 04/05/2018 in [place] Office Visit from 12/06/2017 in [place]  PHQ-2 Total Score 1 2 2 5 2   PHQ-9 Total Score -- 5 5 19 6       Flowsheet Row Video  Visit from 04/20/2020 in [place] No Risk        Assessment and Plan: Dhyana Bastone. Barrett presents to [facility] with reports of anxiety symptoms, requesting medications to be refilled. Patient made aware that medications were previously e-scribed to her preferred pharmacy by an alternative provider. Patient agreed to pick up medications.  Diagnosis:    ICD-10-CM   1. GAD (generalized anxiety disorder)  F41.1         Franne Grip, NP 03/19/2021, 12:54 AM

## 2021-03-23 ENCOUNTER — Encounter (HOSPITAL_COMMUNITY): Payer: Self-pay

## 2021-03-23 ENCOUNTER — Telehealth (HOSPITAL_COMMUNITY): Payer: No Payment, Other | Admitting: Psychiatry

## 2021-04-23 ENCOUNTER — Other Ambulatory Visit: Payer: Self-pay

## 2021-04-23 ENCOUNTER — Other Ambulatory Visit: Payer: Self-pay | Admitting: Family Medicine

## 2021-04-23 DIAGNOSIS — I10 Essential (primary) hypertension: Secondary | ICD-10-CM

## 2021-04-23 NOTE — Telephone Encounter (Signed)
Requested medication (s) are due for refill today: expired medication ? ?Requested medication (s) are on the active medication list: yes ? ?Last refill:  2//1/22-2/23/23 #30 6 refills ? ?Future visit scheduled: no ? ?Notes to clinic:  expired medication. Do you want to renew Rx? Contacted patient to schedule OV . Patient reports she will have to call back. Requesting to pick up medications today . ? ? ?  ?Requested Prescriptions  ?Pending Prescriptions Disp Refills  ? lisinopril (ZESTRIL) 5 MG tablet 30 tablet 6  ?  Sig: TAKE 1 TABLET (5 MG TOTAL) BY MOUTH DAILY.  ?  ? Cardiovascular:  ACE Inhibitors Failed - 04/23/2021 10:37 AM  ?  ?  Failed - Cr in normal range and within 180 days  ?  Creat  ?Date Value Ref Range Status  ?10/28/2015 0.64 0.50 - 1.05 mg/dL Final  ?  Comment:  ?    ?For patients > or = 57 years of age: The upper reference limit for ?Creatinine is approximately 13% higher for people identified as ?African-American. ?  ?  ? ?Creatinine, Ser  ?Date Value Ref Range Status  ?04/16/2020 0.88 0.57 - 1.00 mg/dL Final  ?  ?  ?  ?  Failed - K in normal range and within 180 days  ?  Potassium  ?Date Value Ref Range Status  ?04/16/2020 4.6 3.5 - 5.2 mmol/L Final  ?  ?  ?  ?  Failed - Last BP in normal range  ?  BP Readings from Last 1 Encounters:  ?03/17/20 (!) 141/86  ?  ?  ?  ?  Failed - Valid encounter within last 6 months  ?  Recent Outpatient Visits   ? ?      ? 1 year ago Pedal edema  ? [facility] Charlott Rakes, MD  ? 1 year ago Annual physical exam  ? [place], [place]  ? 3 years ago Annual physical exam  ? [place], Charlane Ferretti, MD  ? 3 years ago Fatigue, unspecified type  ? [place], [place], [place]  ? 3 years ago Anxiety  ? [place] Charlott Rakes, MD  ? ?  ?  ? ?  ?  ?  Passed - Patient is not pregnant  ?  ?  ? ?

## 2021-04-23 NOTE — Telephone Encounter (Signed)
Contacted patient to schedule future appt. Patient reports she will have to call back and schedule appt.  ?

## 2021-04-28 ENCOUNTER — Other Ambulatory Visit (HOSPITAL_COMMUNITY): Payer: Self-pay

## 2021-05-17 ENCOUNTER — Telehealth (HOSPITAL_COMMUNITY): Payer: No Payment, Other | Admitting: Psychiatry

## 2021-06-02 ENCOUNTER — Ambulatory Visit (INDEPENDENT_AMBULATORY_CARE_PROVIDER_SITE_OTHER): Payer: No Payment, Other | Admitting: Psychiatry

## 2021-06-02 DIAGNOSIS — F411 Generalized anxiety disorder: Secondary | ICD-10-CM

## 2021-06-02 MED ORDER — SERTRALINE HCL 100 MG PO TABS: 150.0000 mg | ORAL_TABLET | Freq: Every day | ORAL | 1 refills | Status: DC

## 2021-06-02 MED ORDER — BUSPIRONE HCL 10 MG PO TABS: 10.0000 mg | ORAL_TABLET | Freq: Three times a day (TID) | ORAL | 1 refills | Status: DC

## 2021-06-02 MED ORDER — GABAPENTIN 300 MG PO CAPS: ORAL_CAPSULE | ORAL | 1 refills | Status: DC

## 2021-06-02 NOTE — Progress Notes (Signed)
[facility] MD/PA/NP OP Progress Note ? ?06/02/2021 4:45 PM ?Stacy Barrett  ?MRN:  [id number] ?Virtual Visit via Telephone Note ? ?I connected with Stacy Barrett on 06/02/21 at  4:30 PM EDT by telephone and verified that I am speaking with the correct person using two identifiers. ? ?Location: ?Patient: home ?Provider: offsite ?  ?I discussed the limitations, risks, security and privacy concerns of performing an evaluation and management service by telephone and the availability of in person appointments. I also discussed with the patient that there may be a patient responsible charge related to this service. The patient expressed understanding and agreed to proceed. ? ? ?  ?I discussed the assessment and treatment plan with the patient. The patient was provided an opportunity to ask questions and all were answered. The patient agreed with the plan and demonstrated an understanding of the instructions. ?  ?The patient was advised to call back or seek an in-person evaluation if the symptoms worsen or if the condition fails to improve as anticipated. ? ?I provided 10 minutes of non-face-to-face time during this encounter. ? ? ?Franne Grip, NP  ? ?Chief Complaint: Medication management ? ?HPI: Stacy Barrett is a 56 year old female presenting to [facility] behavioral health outpatient for follow-up psychiatric evaluation.  Patient has a psychiatric history of major depressive disorder, panic attacks, and generalized anxiety disorder.  Her symptoms are managed with Zoloft 150 mg daily, gabapentin 300 mg 3 times daily, and BuSpar 10 mg twice daily.  Patient reports continued anxiety despite medication compliance.  Patient denies adverse medication effects.  Patient in agreement with increase in BuSpar to 10 mg 3 times daily to manage anxiety symptoms.  Medication benefits versus risks discussed. ? ?Visit Diagnosis:  ?  ICD-10-CM   ?1. GAD (generalized anxiety disorder)  F41.1   ?  ? ? ?Past Psychiatric History: Major  depressive disorder, panic attacks and generalized anxiety disorder ? ?Past Medical History:  ?Past Medical History:  ?Diagnosis Date  ? Anxiety   ? Depression   ? Edema   ? Hypertension   ? Lumbar radiculopathy   ? Pituitary adenoma ([place])   ? Vertebral fracture   ? No past surgical history on file. ? ?Family Psychiatric History: N/A ? ?Family History:  ?Family History  ?Problem Relation Age of Onset  ? CAD Mother   ? ? ?Social History:  ?Social History  ? ?Socioeconomic History  ? Marital status: Unknown  ?  Spouse name: Not on file  ? Number of children: Not on file  ? Years of education: Not on file  ? Highest education level: Not on file  ?Occupational History  ? Not on file  ?Tobacco Use  ? Smoking status: Every Day  ?  Packs/day: 0.50  ?  Types: Cigarettes  ? Smokeless tobacco: Never  ?Vaping Use  ? Vaping Use: Former  ?Substance and Sexual Activity  ? Alcohol use: Yes  ?  Alcohol/week: 1.0 standard drink  ?  Types: 1 Glasses of wine per week  ?  Comment: "hardly ever"  ? Drug use: Yes  ?  Frequency: 2.0 times per week  ?  Types: Marijuana  ? Sexual activity: Not on file  ?Other Topics Concern  ? Not on file  ?Social History Narrative  ? Not on file  ? ?Social Determinants of Health  ? ?Financial Resource Strain: Not on file  ?Food Insecurity: Not on file  ?Transportation Needs: Not on file  ?Physical Activity: Not on file  ?Stress:  Not on file  ?Social Connections: Not on file  ? ? ?Allergies:  ?Allergies  ?Allergen Reactions  ? Other Other (See Comments)  ?  MSG -Very intense headaches  ? Penicillins Hives  ? Prednisone Itching, Swelling and Rash  ?  Swelling of lips and face  ? Sulfa Antibiotics Hives and Itching  ? ? ?Metabolic Disorder Labs: ?Lab Results  ?Component Value Date  ? HGBA1C 5.7 (H) 10/08/2013  ? MPG 117 (H) 10/08/2013  ? ?Lab Results  ?Component Value Date  ? PROLACTIN 37.5 (H) 10/28/2015  ? PROLACTIN 36.8 07/18/2014  ? ?Lab Results  ?Component Value Date  ? CHOL 155 05/22/2017  ? TRIG 124  05/22/2017  ? HDL 47 05/22/2017  ? CHOLHDL 3.3 05/22/2017  ? VLDL 60 (H) 10/28/2015  ? [place] 83 05/22/2017  ? [place] 86 06/14/2016  ? ?Lab Results  ?Component Value Date  ? TSH 1.270 12/16/2019  ? TSH 1.130 12/06/2017  ? ? ?Therapeutic Level Labs: ?Lab Results  ?Component Value Date  ? LITHIUM <0.25 (L) 10/03/2013  ? ?No results found for: VALPROATE ?No components found for:  CBMZ ? ?Current Medications: ?Current Outpatient Medications  ?Medication Sig Dispense Refill  ? atorvastatin (LIPITOR) 20 MG tablet Take 1 tablet (20 mg total) by mouth daily. 30 tablet 6  ? busPIRone (BUSPAR) 10 MG tablet Take 1 tablet (10 mg total) by mouth 3 (three) times daily. 90 tablet 1  ? cetirizine (ZYRTEC) 10 MG tablet TAKE 1 TABLET (10 MG TOTAL) BY MOUTH DAILY. 30 tablet 1  ? fluticasone (FLONASE) 50 MCG/ACT nasal spray PLACE 2 SPRAYS INTO BOTH NOSTRILS DAILY. 16 g 6  ? furosemide (LASIX) 20 MG tablet TAKE 1 TABLET (20 MG TOTAL) BY MOUTH DAILY AS NEEDED FOR EDEMA. 30 tablet 3  ? gabapentin (NEURONTIN) 300 MG capsule TAKE 1 CAPSULE BY MOUTH THREE TIMES A DAY 90 capsule 1  ? hydrochlorothiazide (HYDRODIURIL) 25 MG tablet TAKE 1 TABLET (25 MG TOTAL) BY MOUTH DAILY. MUST HAVE OFFICE VISIT FOR REFILLS 30 tablet 6  ? lisinopril (ZESTRIL) 5 MG tablet TAKE 1 TABLET (5 MG TOTAL) BY MOUTH DAILY. 30 tablet 6  ? sertraline (ZOLOFT) 100 MG tablet Take 1.5 tablets (150 mg total) by mouth daily. 45 tablet 1  ? Vitamin D, Ergocalciferol, (DRISDOL) 1.25 MG (50000 UNIT) CAPS capsule Take 1 capsule by mouth every 7 (seven) days. 16 capsule 0  ? ?No current facility-administered medications for this visit.  ? ? ? ?Musculoskeletal: ?Strength & Muscle Tone:  n/a virtual visit ?Gait & Station:  n/a ?Patient leans: N/A ? ?Psychiatric Specialty Exam: ?Review of Systems  ?Psychiatric/Behavioral:  Negative for hallucinations, self-injury and suicidal ideas. The patient is nervous/anxious.   ?All other systems reviewed and are negative.  ?There were no  vitals taken for this visit.There is no height or weight on file to calculate BMI.  ?General Appearance: NA  ?Eye Contact:  NA  ?Speech:  Clear and Coherent  ?Volume:  Normal  ?Mood:  anxious  ?Affect:  NA  ?Thought Process:  Goal Directed  ?Orientation:  Full (Time, Place, and Person)  ?Thought Content: Logical   ?Suicidal Thoughts:  No  ?Homicidal Thoughts:  No  ?Memory:   good  ?Judgement:  Good  ?Insight:  Good  ?Psychomotor Activity:  NA  ?Concentration:  good  ?Recall:  Good  ?Fund of Knowledge: Good  ?Language: Good  ?Akathisia:  NA  ?Handed:  Right  ?AIMS (if indicated): not done  ?Assets:  Communication  Skills ?Desire for Improvement  ?ADL's:  Intact  ?Cognition: WNL  ?Sleep:  Good  ? ?Screenings: ?AIMS   ? ?Flowsheet Row [facility]20/2020 [facility]  ?AIMS Total Score 0  ? ?  ? ?AUDIT   ? ?Flowsheet Row Admission (Discharged) from 10/06/2013 in [facility]  ?Alcohol Use Disorder Identification Test Final Score (AUDIT) 0  ? ?  ? ?GAD-7   ? ?[place] Office Visit from 03/17/2020 in [place] Office Visit from 12/16/2019 in [place] Office Visit from 04/05/2018 in [place] Office Visit from 12/06/2017 in [place] Office Visit from 05/19/2017 in [place]  ?Total GAD-7 Score '4 3 9 8 10  '$ ? ?  ? ?PHQ2-9   ? ?Flowsheet Row Video Visit from 04/20/2020 in [facility] Office Visit from 03/17/2020 in [place] Office Visit from 12/16/2019 in [place] Office Visit from 04/05/2018 in [place] Office Visit from 12/06/2017 in [place]  ?PHQ-2 Total Score '1 2 2 5 2  '$ ?PHQ-9 Total Score -- '5 5 19 6  '$ ? ?  ? ?Flowsheet Row Video Visit from  04/20/2020 in [facility]  ?C-SSRS RISK CATEGORY No Risk  ? ?  ? ? ? ?Assessment and Plan: Stacy Barrett is a 56 year old female presenting to [facility] behavioral health outpatient for

## 2021-06-22 ENCOUNTER — Ambulatory Visit: Payer: Self-pay | Admitting: *Deleted

## 2021-06-22 ENCOUNTER — Ambulatory Visit: Payer: Self-pay | Admitting: Family Medicine

## 2021-06-22 NOTE — Telephone Encounter (Signed)
She needs an appointment.  Can be virtual. ?

## 2021-06-22 NOTE — Telephone Encounter (Signed)
?Chief Complaint: anxiety and panic attacks, had to cancel appt  ?Symptoms: crying , anxiety now due to cancelling appt today. Overwhelmed, feeling trouble completing tasks everyday. Procrastinates on house work.  ?Frequency: worsening over the past 6 months ?Pertinent Negatives: Patient denies chest pain difficulty breathing heart racing now. Denies wanting to harm self or others  ?Disposition: '[]'$ ED /'[]'$ Urgent Care (no appt availability in office) / '[]'$ Appointment(In office/virtual)/ '[]'$  [facility] [facility]/ '[]'$ Home Care/ '[]'$ Refused Recommended Disposition /'[]'$ [facility] [facility]/ '[x]'$  Follow-up with PCP ?Additional Notes:  ? ?Patient is only caregiver for her mother and prior to leaving for appt patient's mother was talking confused and patient reports she can not leave her mother . Will need to call back and reschedule if she can get a sitter to stay with her mother during appt. Please advise if any available times patient can be seen for medication refills. Patient recommended to call hot line text "[phone]" if needed or call her counselor at [facility] Patient reports she would like to see her PCP . Please advise . Emotional support given to patient and she reported less anxiety at end of call. Recommended to call 911 if needed. ? ? Reason for Disposition ? Patient sounds very upset or troubled to the triager ? ?Answer Assessment - Initial Assessment Questions ?1. CONCERN: "Did anything happen that prompted you to call today?"  ?    Patient's mother woke up confused and patient is her caregiver ?2. ANXIETY SYMPTOMS: "Can you describe how you (your loved one; patient) have been feeling?" (e.g., tense, restless, panicky, anxious, keyed up, overwhelmed, sense of impending doom).  ?    Overwhelmed as a caregiver for mother  ?3. ONSET: "How long have you been feeling this way?" (e.g., hours, days, weeks) ?    Worsening over the past 6 months ?4. SEVERITY: "How would you rate the level of anxiety?" (e.g., 0 - 10; or mild,  moderate, severe). ?    Moderate , severe at this time ?5. FUNCTIONAL IMPAIRMENT: "How have these feelings affected your ability to do daily activities?" "Have you had more difficulty than usual doing your normal daily activities?" (e.g., getting better, same, worse; self-care, school, work, interactions) ?    Procrastinating chores, unable to complete tasks at times ?6. HISTORY: "Have you felt this way before?" "Have you ever been diagnosed with an anxiety problem in the past?" (e.g., generalized anxiety disorder, panic attacks, PTSD). If Yes, ask: "How was this problem treated?" (e.g., medicines, counseling, etc.) ?    Yes , has been seen at [facility] ?7. RISK OF HARM - SUICIDAL IDEATION: "Do you ever have thoughts of hurting or killing yourself?" If Yes, ask:  "Do you have these feelings now?" "Do you have a plan on how you would do this?" ?    Yes but no plan and reports she has no thoughts of hurting self now or others ?8. TREATMENT:  "What has been done so far to treat this anxiety?" (e.g., medicines, relaxation strategies). "What has helped?" ?    medication ?9. TREATMENT - THERAPIST: "Do you have a counselor or therapist? Name?" ?    Yes at [facility] behavioral health ?10. POTENTIAL TRIGGERS: "Do you drink caffeinated beverages (e.g., coffee, colas, teas), and how much daily?" "Do you drink alcohol or use any drugs?" "Have you started any new medicines recently?" ?    na ?10. PATIENT SUPPORT: "Who is with you now?" "Who do you live with?" "Do you have family or friends who  you can talk to?"  ?      Patient's mother but she is confused ?11. OTHER SYMPTOMS: "Do you have any other symptoms?" (e.g., feeling depressed, trouble concentrating, trouble sleeping, trouble breathing, palpitations or fast heartbeat, chest pain, sweating, nausea, or diarrhea) ?      Anxiety, trouble completing tasks, overwhelmed with caregiving  ?12. PREGNANCY: "Is there any chance you are pregnant?" "When was your last menstrual  period?" ?      na ? ?Protocols used: Anxiety and Panic Attack-A-AH ? ?

## 2021-06-22 NOTE — Telephone Encounter (Signed)
FYI

## 2021-06-23 NOTE — Telephone Encounter (Signed)
Contacted pt to schedule a virtual appt pt didn't answer and was unable to lvm due to vm being full  ?

## 2021-07-14 ENCOUNTER — Telehealth (HOSPITAL_COMMUNITY): Payer: No Payment, Other | Admitting: Psychiatry

## 2021-07-22 ENCOUNTER — Encounter (HOSPITAL_COMMUNITY): Payer: Self-pay | Admitting: Psychiatry

## 2021-07-22 ENCOUNTER — Telehealth (INDEPENDENT_AMBULATORY_CARE_PROVIDER_SITE_OTHER): Payer: No Payment, Other | Admitting: Psychiatry

## 2021-07-22 DIAGNOSIS — F331 Major depressive disorder, recurrent, moderate: Secondary | ICD-10-CM | POA: Diagnosis not present

## 2021-07-22 DIAGNOSIS — F411 Generalized anxiety disorder: Secondary | ICD-10-CM | POA: Diagnosis not present

## 2021-07-22 MED ORDER — ARIPIPRAZOLE 2 MG PO TABS: 2.0000 mg | ORAL_TABLET | Freq: Every day | ORAL | 3 refills | Status: DC

## 2021-07-22 MED ORDER — GABAPENTIN 300 MG PO CAPS: ORAL_CAPSULE | ORAL | 3 refills | Status: DC

## 2021-07-22 MED ORDER — SERTRALINE HCL 100 MG PO TABS: 150.0000 mg | ORAL_TABLET | Freq: Every day | ORAL | 3 refills | Status: DC

## 2021-07-22 MED ORDER — BUSPIRONE HCL 10 MG PO TABS: 10.0000 mg | ORAL_TABLET | Freq: Three times a day (TID) | ORAL | 3 refills | Status: DC

## 2021-07-22 NOTE — Progress Notes (Signed)
BH MD/PA/NP OP Progress Note Virtual Visit via Telephone Note  I connected with Stacy Barrett on 07/22/21 at  4:00 PM EDT by telephone and verified that I am speaking with the correct person using two identifiers.  Location: Patient: home Provider: Clinic   I discussed the limitations, risks, security and privacy concerns of performing an evaluation and management service by telephone and the availability of in person appointments. I also discussed with the patient that there may be a patient responsible charge related to this service. The patient expressed understanding and agreed to proceed.   I provided 30 minutes of non-face-to-face time during this encounter.  07/22/2021 2:18 PM Stacy Barrett  MRN:  [id number]  Chief Complaint: "My anxiety gets the best of me and I can not complete task" HPI: 56 year old female seen today for follow-up psychiatric evaluation.  She has a psychiatric history of anxiety, depression, panic disorder, and tobacco dependence.  She is currently managed on BuSpar 10 mg 3 times daily, gabapentin 300 mg 3 times daily, and Zoloft 150 mg daily.  She informed Probation officer that her medications are somewhat effective in managing her psychiatric addition.  Today she is well-groomed, pleasant, cooperative, engaged in conversation, and maintained eye contact.  Patient tearful throughout the exam.  She notes that her anxiety gets the best of her and she cannot complete tasks for herself or her mother.  Patient notes that she cares for her mother who health is declining.  She notes that her mother had head trauma and seems to be suffering from dementia.  She reports that she has behavioral outburst.  Patient notes that she is overwhelmed with the responsibility of caring for her mother.  She notes that she does not have siblings or other support systems.  She reports that she cries daily at the thought of losing her mother.  Provider conducted a GAD-7 wand she score an 18.  Provider  also conducted PHQ-9 wand she score a 19.  She endorses having adequate appetite.  She notes that her sleep fluctuates.  Today she denies SI/HI/VAH or paranoia.  Patient reports that her mind races, she is irritable, distractible, and has fluctuations in mood at times.  Patient reports that she has several physical comorbidities that exacerbate her anxiety and depression.  She reports having leg, foot, back, and knee pain  She notes gabapentin is somewhat effective in managing her pain.  Provider recommended increasing gabapentin to help with mood, anxiety, and pain.  Patient notes however that gabapentin makes her legs feel heavy and she notes that she does not want to fall.  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  He notes that this gets her out of the house.  Provider suggested starting therapy.  Patient notes that at this time she is not interested in therapy but will consider it in future.  Today she is agreeable to starting Abilify 2 mg to help manage mood.  She informed Probation officer in the past she found that Abilify was effective in managing her psychiatric conditions however notes that she discontinued due to fear of side effects such as increased cholesterol and diabetes.  Provider informed patient that Abilify could potentially cause metabolic syndrome.  She endorsed understanding and notes that she would like to get a second try. Potential side effects of medication and risks vs benefits of treatment vs non-treatment were explained and discussed. All questions were answered.  No other concerns at  this time.  Visit Diagnosis:    ICD-10-CM   1. Moderate episode of recurrent major depressive disorder (HCC)  F33.1 ARIPiprazole (ABILIFY) 2 MG tablet    busPIRone (BUSPAR) 10 MG tablet    gabapentin (NEURONTIN) 300 MG capsule    sertraline (ZOLOFT) 100 MG tablet    2. GAD (generalized anxiety disorder)  F41.1 busPIRone (BUSPAR) 10 MG tablet     gabapentin (NEURONTIN) 300 MG capsule    sertraline (ZOLOFT) 100 MG tablet      Past Psychiatric History: anxiety, depression, panic disorder, and tobacco dependence  Past Medical History:  Past Medical History:  Diagnosis Date   Anxiety    Depression    Edema    Hypertension    Lumbar radiculopathy    Pituitary adenoma ([place])    Vertebral fracture    History reviewed. No pertinent surgical history.  Family Psychiatric History: Na  Family History:  Family History  Problem Relation Age of Onset   CAD Mother     Social History:  Social History   Socioeconomic History   Marital status: Unknown    Spouse name: Not on file   Number of children: Not on file   Years of education: Not on file   Highest education level: Not on file  Occupational History   Not on file  Tobacco Use   Smoking status: Every Day    Packs/day: 0.50    Types: Cigarettes   Smokeless tobacco: Never  Vaping Use   Vaping Use: Former  Substance and Sexual Activity   Alcohol use: Yes    Alcohol/week: 1.0 standard drink of alcohol    Types: 1 Glasses of wine per week    Comment: "hardly ever"   Drug use: Yes    Frequency: 2.0 times per week    Types: Marijuana   Sexual activity: Not on file  Other Topics Concern   Not on file  Social History Narrative   Not on file   Social Determinants of Health   Financial Resource Strain: Not on file  Food Insecurity: Not on file  Transportation Needs: Not on file  Physical Activity: Not on file  Stress: Not on file  Social Connections: Not on file    Allergies:  Allergies  Allergen Reactions   Other Other (See Comments)    MSG -Very intense headaches   Penicillins Hives   Prednisone Itching, Swelling and Rash    Swelling of lips and face   Sulfa Antibiotics Hives and Itching    Metabolic Disorder Labs: Lab Results  Component Value Date   HGBA1C 5.7 (H) 10/08/2013   MPG 117 (H) 10/08/2013   Lab Results  Component Value Date    PROLACTIN 37.5 (H) 10/28/2015   PROLACTIN 36.8 07/18/2014   Lab Results  Component Value Date   CHOL 155 05/22/2017   TRIG 124 05/22/2017   HDL 47 05/22/2017   CHOLHDL 3.3 05/22/2017   VLDL 60 (H) 10/28/2015   LDLCALC 83 05/22/2017   LDLCALC 86 06/14/2016   Lab Results  Component Value Date   TSH 1.270 12/16/2019   TSH 1.130 12/06/2017    Therapeutic Level Labs: Lab Results  Component Value Date   LITHIUM <0.25 (L) 10/03/2013   No results found for: "VALPROATE" No results found for: "CBMZ"  Current Medications: Current Outpatient Medications  Medication Sig Dispense Refill   ARIPiprazole (ABILIFY) 2 MG tablet Take 1 tablet (2 mg total) by mouth daily. 30 tablet 3   atorvastatin (LIPITOR) 20  MG tablet Take 1 tablet (20 mg total) by mouth daily. 30 tablet 6   busPIRone (BUSPAR) 10 MG tablet Take 1 tablet (10 mg total) by mouth 3 (three) times daily. 90 tablet 3   cetirizine (ZYRTEC) 10 MG tablet TAKE 1 TABLET (10 MG TOTAL) BY MOUTH DAILY. 30 tablet 1   fluticasone (FLONASE) 50 MCG/ACT nasal spray PLACE 2 SPRAYS INTO BOTH NOSTRILS DAILY. 16 g 6   furosemide (LASIX) 20 MG tablet TAKE 1 TABLET (20 MG TOTAL) BY MOUTH DAILY AS NEEDED FOR EDEMA. 30 tablet 3   gabapentin (NEURONTIN) 300 MG capsule TAKE 1 CAPSULE BY MOUTH THREE TIMES A DAY 90 capsule 3   hydrochlorothiazide (HYDRODIURIL) 25 MG tablet TAKE 1 TABLET (25 MG TOTAL) BY MOUTH DAILY. MUST HAVE OFFICE VISIT FOR REFILLS 30 tablet 6   lisinopril (ZESTRIL) 5 MG tablet TAKE 1 TABLET (5 MG TOTAL) BY MOUTH DAILY. 30 tablet 6   sertraline (ZOLOFT) 100 MG tablet Take 1.5 tablets (150 mg total) by mouth daily. 45 tablet 3   Vitamin D, Ergocalciferol, (DRISDOL) 1.25 MG (50000 UNIT) CAPS capsule Take 1 capsule by mouth every 7 (seven) days. 16 capsule 0   No current facility-administered medications for this visit.     Musculoskeletal: Strength & Muscle Tone:  Unable to assess due to telephone visit Gait & Station:  Unable to  assess due to telephone visit Patient leans: N/A  Psychiatric Specialty Exam: Review of Systems  There were no vitals taken for this visit.There is no height or weight on file to calculate BMI.  General Appearance: Well Groomed  Eye Contact:  Good  Speech:  Clear and Coherent and Normal Rate  Volume:  Normal  Mood:  Anxious and Depressed  Affect:  Appropriate, Congruent, and Tearful  Thought Process:  Coherent, Goal Directed, and Linear  Orientation:  Full (Time, Place, and Person)  Thought Content: WDL and Logical   Suicidal Thoughts:  No  Homicidal Thoughts:  No  Memory:  Immediate;   Good Recent;   Good Remote;   Good  Judgement:  Good  Insight:  Good  Psychomotor Activity:   Unable to assess due to telephone visit  Concentration:  Concentration: Good and Attention Span: Good  Recall:  Good  Fund of Knowledge: Good  Language: Good  Akathisia:   Unable to assess due to telephone visit  Handed:  Right  AIMS (if indicated): not done  Assets:  Communication Skills Desire for Improvement Financial Resources/Insurance Housing Physical Health  ADL's:  Intact  Cognition: WNL  Sleep:  Fair   Screenings: AIMS    Flowsheet Row Video Visit from 04/20/2020 in [place] Total Score 0      AUDIT    [facility] Admission (Discharged) from 10/06/2013 in [facility]  Alcohol Use Disorder Identification Test Final Score (AUDIT) 0      GAD-7    Flowsheet Row Video Visit from 07/22/2021 in [facility] Office Visit from 03/17/2020 in [place] Office Visit from 12/16/2019 in [place] Office Visit from 04/05/2018 in [place] Office Visit from 12/06/2017 in [place]  Total GAD-7 Score '18 4 3 9 8      '$ PHQ2-9    Flowsheet Row Video Visit from 07/22/2021 in  [facility] Video Visit from 04/20/2020 in [facility] Office Visit from 03/17/2020 in [place] Office Visit from 12/16/2019 in [place] Office Visit from 04/05/2018 in [place]  PHQ-2 Total Score '5 1 2 2 5  '$ PHQ-9 Total Score 19 -- '5 5 19      '$ Flowsheet Row Video Visit from 07/22/2021 in [facility] Video Visit from 04/20/2020 in [place] Error: Q7 should not be populated when Q6 is No No Risk        Assessment and Plan: Patient endorses symptoms of anxiety, depression, and poor sleep due to life stressors.  At this time she is agreeable to starting Abilify 2 mg to help manage mood.  Patient does not want to increase gabapentin and is not interested in therapy at this time.  She will continue her other medications as prescribed.  1. Moderate episode of recurrent major depressive disorder (HCC)  Start- ARIPiprazole (ABILIFY) 2 MG tablet; Take 1 tablet (2 mg total) by mouth daily.  Dispense: 30 tablet; Refill: 3 Continue- busPIRone (BUSPAR) 10 MG tablet; Take 1 tablet (10 mg total) by mouth 3 (three) times daily.  Dispense: 90 tablet; Refill: 3 Continue- gabapentin (NEURONTIN) 300 MG capsule; TAKE 1 CAPSULE BY MOUTH THREE TIMES A DAY  Dispense: 90 capsule; Refill: 3 Continue- sertraline (ZOLOFT) 100 MG tablet; Take 1.5 tablets (150 mg total) by mouth daily.  Dispense: 45 tablet; Refill: 3  2. GAD (generalized anxiety disorder)  Continue- busPIRone (BUSPAR) 10 MG tablet; Take 1 tablet (10 mg total) by mouth 3 (three) times daily.  Dispense: 90 tablet; Refill: 3 Continue- gabapentin (NEURONTIN) 300 MG capsule; TAKE 1 CAPSULE BY MOUTH THREE TIMES A DAY  Dispense: 90 capsule; Refill: 3 Continue- sertraline (ZOLOFT) 100 MG tablet; Take 1.5 tablets (150 mg total) by mouth  daily.  Dispense: 45 tablet; Refill: 3   Collaboration of Care: Collaboration of Care: Other provider involved in patient's care AEB counselor  Patient/Guardian was advised Release of Information must be obtained prior to any record release in order to collaborate their care with an outside provider. Patient/Guardian was advised if they have not already done so to contact the registration department to sign all necessary forms in order for [place] to release information regarding their care.   Consent: Patient/Guardian gives verbal consent for treatment and assignment of benefits for services provided during this visit. Patient/Guardian expressed understanding and agreed to proceed.   Follow-up in 3 months Salley Slaughter, NP 07/22/2021, 2:18 PM

## 2021-08-02 ENCOUNTER — Other Ambulatory Visit: Payer: Self-pay

## 2021-08-02 ENCOUNTER — Ambulatory Visit: Payer: Medicaid Other | Attending: Family Medicine | Admitting: Family Medicine

## 2021-08-02 ENCOUNTER — Encounter: Payer: Self-pay | Admitting: Family Medicine

## 2021-08-02 VITALS — BP 171/98 | HR 89 | Temp 98.5°F | Ht 65.0 in | Wt 193.0 lb

## 2021-08-02 DIAGNOSIS — M5432 Sciatica, left side: Secondary | ICD-10-CM

## 2021-08-02 DIAGNOSIS — M533 Sacrococcygeal disorders, not elsewhere classified: Secondary | ICD-10-CM

## 2021-08-02 DIAGNOSIS — I10 Essential (primary) hypertension: Secondary | ICD-10-CM

## 2021-08-02 DIAGNOSIS — M79671 Pain in right foot: Secondary | ICD-10-CM

## 2021-08-02 DIAGNOSIS — M79672 Pain in left foot: Secondary | ICD-10-CM

## 2021-08-02 DIAGNOSIS — Z23 Encounter for immunization: Secondary | ICD-10-CM

## 2021-08-02 DIAGNOSIS — Z1211 Encounter for screening for malignant neoplasm of colon: Secondary | ICD-10-CM

## 2021-08-02 DIAGNOSIS — F17210 Nicotine dependence, cigarettes, uncomplicated: Secondary | ICD-10-CM

## 2021-08-02 DIAGNOSIS — Z1231 Encounter for screening mammogram for malignant neoplasm of breast: Secondary | ICD-10-CM

## 2021-08-02 DIAGNOSIS — R600 Localized edema: Secondary | ICD-10-CM

## 2021-08-02 DIAGNOSIS — Z1159 Encounter for screening for other viral diseases: Secondary | ICD-10-CM

## 2021-08-02 DIAGNOSIS — E7849 Other hyperlipidemia: Secondary | ICD-10-CM

## 2021-08-02 DIAGNOSIS — M5431 Sciatica, right side: Secondary | ICD-10-CM

## 2021-08-02 MED ORDER — LISINOPRIL 5 MG PO TABS: ORAL_TABLET | Freq: Every day | ORAL | 6 refills | Status: DC

## 2021-08-02 MED ORDER — FUROSEMIDE 20 MG PO TABS: ORAL_TABLET | ORAL | 3 refills | Status: DC

## 2021-08-02 MED ORDER — MELOXICAM 7.5 MG PO TABS
7.5000 mg | ORAL_TABLET | Freq: Every day | ORAL | 1 refills | Status: DC
  Filled 2021-08-02 – 2022-01-21 (×6): qty 30, 30d supply, fill #0

## 2021-08-02 MED ORDER — ATORVASTATIN CALCIUM 20 MG PO TABS: 20.0000 mg | ORAL_TABLET | Freq: Every day | ORAL | 6 refills | Status: DC

## 2021-08-02 NOTE — Progress Notes (Signed)
Restart all medication. Referral to podiatry for pain in feet. Back pain. Fell on tailbone 2 days ago.

## 2021-08-02 NOTE — Patient Instructions (Signed)
Managing Your Hypertension Hypertension, also called high blood pressure, is when the force of the blood pressing against the walls of the arteries is too strong. Arteries are blood vessels that carry blood from your heart throughout your body. Hypertension forces the heart to work harder to pump blood and may cause the arteries to become narrow or stiff. Understanding blood pressure readings A blood pressure reading includes a higher number over a lower number: The first, or top, number is called the systolic pressure. It is a measure of the pressure in your arteries as your heart beats. The second, or bottom number, is called the diastolic pressure. It is a measure of the pressure in your arteries as the heart relaxes. For most people, a normal blood pressure is below 120/80. Your personal target blood pressure may vary depending on your medical conditions, your age, and other factors. Blood pressure is classified into four stages. Based on your blood pressure reading, your health care provider may use the following stages to determine what type of treatment you need, if any. Systolic pressure and diastolic pressure are measured in a unit called millimeters of mercury (mmHg). Normal Systolic pressure: below 120. Diastolic pressure: below 80. Elevated Systolic pressure: 120-129. Diastolic pressure: below 80. Hypertension stage 1 Systolic pressure: 130-139. Diastolic pressure: 80-89. Hypertension stage 2 Systolic pressure: 140 or above. Diastolic pressure: 90 or above. How can this condition affect me? Managing your hypertension is very important. Over time, hypertension can damage the arteries and decrease blood flow to parts of the body, including the brain, heart, and kidneys. Having untreated or uncontrolled hypertension can lead to: A heart attack. A stroke. A weakened blood vessel (aneurysm). Heart failure. Kidney damage. Eye damage. Memory and concentration problems. Vascular  dementia. What actions can I take to manage this condition? Hypertension can be managed by making lifestyle changes and possibly by taking medicines. Your health care provider will help you make a plan to bring your blood pressure within a normal range. You may be referred for counseling on a healthy diet and physical activity. Nutrition  Eat a diet that is high in fiber and potassium, and low in salt (sodium), added sugar, and fat. An example eating plan is called the DASH diet. DASH stands for Dietary Approaches to Stop Hypertension. To eat this way: Eat plenty of fresh fruits and vegetables. Try to fill one-half of your plate at each meal with fruits and vegetables. Eat whole grains, such as whole-wheat pasta, brown rice, or whole-grain bread. Fill about one-fourth of your plate with whole grains. Eat low-fat dairy products. Avoid fatty cuts of meat, processed or cured meats, and poultry with skin. Fill about one-fourth of your plate with lean proteins such as fish, chicken without skin, beans, eggs, and tofu. Avoid pre-made and processed foods. These tend to be higher in sodium, added sugar, and fat. Reduce your daily sodium intake. Many people with hypertension should eat less than 1,500 mg of sodium a day. Lifestyle  Work with your health care provider to maintain a healthy body weight or to lose weight. Ask what an ideal weight is for you. Get at least 30 minutes of exercise that causes your heart to beat faster (aerobic exercise) most days of the week. Activities may include walking, swimming, or biking. Include exercise to strengthen your muscles (resistance exercise), such as weight lifting, as part of your weekly exercise routine. Try to do these types of exercises for 30 minutes at least 3 days a week. Do   not use any products that contain nicotine or tobacco. These products include cigarettes, chewing tobacco, and vaping devices, such as e-cigarettes. If you need help quitting, ask your  health care provider. Control any long-term (chronic) conditions you have, such as high cholesterol or diabetes. Identify your sources of stress and find ways to manage stress. This may include meditation, deep breathing, or making time for fun activities. Alcohol use Do not drink alcohol if: Your health care provider tells you not to drink. You are pregnant, may be pregnant, or are planning to become pregnant. If you drink alcohol: Limit how much you have to: 0-1 drink a day for women. 0-2 drinks a day for men. Know how much alcohol is in your drink. In the [place] one drink equals one 12 oz bottle of beer (355 mL), one 5 oz glass of wine (148 mL), or one 1 oz glass of hard liquor (44 mL). Medicines Your health care provider may prescribe medicine if lifestyle changes are not enough to get your blood pressure under control and if: Your systolic blood pressure is 130 or higher. Your diastolic blood pressure is 80 or higher. Take medicines only as told by your health care provider. Follow the directions carefully. Blood pressure medicines must be taken as told by your health care provider. The medicine does not work as well when you skip doses. Skipping doses also puts you at risk for problems. Monitoring Before you monitor your blood pressure: Do not smoke, drink caffeinated beverages, or exercise within 30 minutes before taking a measurement. Use the bathroom and empty your bladder (urinate). Sit quietly for at least 5 minutes before taking measurements. Monitor your blood pressure at home as told by your health care provider. To do this: Sit with your back straight and supported. Place your feet flat on the floor. Do not cross your legs. Support your arm on a flat surface, such as a table. Make sure your upper arm is at heart level. Each time you measure, take two or three readings one minute apart and record the results. You may also need to have your blood pressure checked regularly by  your health care provider. General information Talk with your health care provider about your diet, exercise habits, and other lifestyle factors that may be contributing to hypertension. Review all the medicines you take with your health care provider because there may be side effects or interactions. Keep all follow-up visits. Your health care provider can help you create and adjust your plan for managing your high blood pressure. Where to find more information National Heart, Lung, and Blood Institute: www.nhlbi.nih.gov American Heart Association: www.heart.org Contact a health care provider if: You think you are having a reaction to medicines you have taken. You have repeated (recurrent) headaches. You feel dizzy. You have swelling in your ankles. You have trouble with your vision. Get help right away if: You develop a severe headache or confusion. You have unusual weakness or numbness, or you feel faint. You have severe pain in your chest or abdomen. You vomit repeatedly. You have trouble breathing. These symptoms may be an emergency. Get help right away. Call 911. Do not wait to see if the symptoms will go away. Do not drive yourself to the hospital. Summary Hypertension is when the force of blood pumping through your arteries is too strong. If this condition is not controlled, it may put you at risk for serious complications. Your personal target blood pressure may vary depending on your medical conditions,   your age, and other factors. For most people, a normal blood pressure is less than 120/80. Hypertension is managed by lifestyle changes, medicines, or both. Lifestyle changes to help manage hypertension include losing weight, eating a healthy, low-sodium diet, exercising more, stopping smoking, and limiting alcohol. This information is not intended to replace advice given to you by your health care provider. Make sure you discuss any questions you have with your health care  provider. Document Revised: 10/15/2020 Document Reviewed: 10/15/2020 Elsevier Patient Education  2023 [employer]  

## 2021-08-02 NOTE — Progress Notes (Signed)
Subjective:  Patient ID: Stacy Barrett, female    DOB: 30-Jan-1966  Age: 56 y.o. MRN: [id number]  CC: Hypertension   HPI Stacy Barrett is a 56 y.o. year old female with a history of hypertension, hyperlipidemia, tobacco abuse, anxiety (followed by [facility] behavioral health) who presents for follow-up visit.  Interval History:  She fell on her sacrum 2 days ago and complains of pain in her tailbone  Her Sciatica acts up after she has been on her foot for long period of time.She feels her Hamstrings have 'shortened' on her as she is unable to take big steps with her legs. She uses Gabapentin and Tylenol with some relief. She complains of pain in her toes with associated numbness and she also has a corn on her right foot hence she is requesting podiatry referral.  Sometimes she feels toes on both feet dorsiflex while she is in bed.  She has also noticed edema of her legs.  She has been stressed being the major caregiver of her mother with resulting exacerbation of her anxiety and depression.  She is currently being followed by [facility] behavioral health for this. During this timeframe she also came off her antihypertensive and her statin but is wanting to resume all her medications again.  Her blood pressure is elevated as a result. Past Medical History:  Diagnosis Date   Anxiety    Depression    Edema    Hypertension    Lumbar radiculopathy    Pituitary adenoma ([place])    Vertebral fracture     No past surgical history on file.  Family History  Problem Relation Age of Onset   CAD Mother     Social History   Socioeconomic History   Marital status: Unknown    Spouse name: Not on file   Number of children: Not on file   Years of education: Not on file   Highest education level: Not on file  Occupational History   Not on file  Tobacco Use   Smoking status: Every Day    Packs/day: 0.50    Types: Cigarettes   Smokeless tobacco: Never  Vaping Use   Vaping  Use: Former  Substance and Sexual Activity   Alcohol use: Yes    Alcohol/week: 1.0 standard drink of alcohol    Types: 1 Glasses of wine per week    Comment: "hardly ever"   Drug use: Yes    Frequency: 2.0 times per week    Types: Marijuana   Sexual activity: Not on file  Other Topics Concern   Not on file  Social History Narrative   Not on file   Social Determinants of Health   Financial Resource Strain: Not on file  Food Insecurity: Not on file  Transportation Needs: Not on file  Physical Activity: Not on file  Stress: Not on file  Social Connections: Not on file    Allergies  Allergen Reactions   Other Other (See Comments)    MSG -Very intense headaches   Penicillins Hives   Prednisone Itching, Swelling and Rash    Swelling of lips and face   Sulfa Antibiotics Hives and Itching    Outpatient Medications Prior to Visit  Medication Sig Dispense Refill   ARIPiprazole (ABILIFY) 2 MG tablet Take 1 tablet (2 mg total) by mouth daily. 30 tablet 3   busPIRone (BUSPAR) 10 MG tablet Take 1 tablet (10 mg total) by mouth 3 (three) times daily. 90 tablet 3  gabapentin (NEURONTIN) 300 MG capsule TAKE 1 CAPSULE BY MOUTH THREE TIMES A DAY 90 capsule 3   sertraline (ZOLOFT) 100 MG tablet Take 1.5 tablets (150 mg total) by mouth daily. 45 tablet 3   Vitamin D, Ergocalciferol, (DRISDOL) 1.25 MG (50000 UNIT) CAPS capsule Take 1 capsule by mouth every 7 (seven) days. 16 capsule 0   atorvastatin (LIPITOR) 20 MG tablet Take 1 tablet (20 mg total) by mouth daily. 30 tablet 6   cetirizine (ZYRTEC) 10 MG tablet TAKE 1 TABLET (10 MG TOTAL) BY MOUTH DAILY. 30 tablet 1   fluticasone (FLONASE) 50 MCG/ACT nasal spray PLACE 2 SPRAYS INTO BOTH NOSTRILS DAILY. 16 g 6   furosemide (LASIX) 20 MG tablet TAKE 1 TABLET (20 MG TOTAL) BY MOUTH DAILY AS NEEDED FOR EDEMA. 30 tablet 3   hydrochlorothiazide (HYDRODIURIL) 25 MG tablet TAKE 1 TABLET (25 MG TOTAL) BY MOUTH DAILY. MUST HAVE OFFICE VISIT FOR REFILLS  30 tablet 6   lisinopril (ZESTRIL) 5 MG tablet TAKE 1 TABLET (5 MG TOTAL) BY MOUTH DAILY. 30 tablet 6   No facility-administered medications prior to visit.     ROS Review of Systems  Constitutional:  Negative for activity change and appetite change.  HENT:  Negative for sinus pressure and sore throat.   Respiratory:  Negative for chest tightness, shortness of breath and wheezing.   Cardiovascular:  Negative for chest pain and palpitations.  Gastrointestinal:  Negative for abdominal distention, abdominal pain and constipation.  Genitourinary: Negative.   Musculoskeletal:  Positive for back pain.  Neurological:  Positive for numbness.  Psychiatric/Behavioral:  Negative for behavioral problems and dysphoric mood.     Objective:  BP (!) 171/98   Pulse 89   Temp 98.5 F (36.9 C) (Oral)   Ht '5\' 5"'  (1.651 m)   Wt 193 lb (87.5 kg)   SpO2 98%   BMI 32.12 kg/m      08/02/2021    2:37 PM 01/27/2021    4:48 PM 01/27/2021    4:20 PM  BP/Weight  Systolic BP 433    Diastolic BP 98    Wt. (Lbs) 193    BMI 32.12 kg/m2       Information is confidential and restricted. Go to Review Flowsheets to unlock data.      Physical Exam Constitutional:      Appearance: She is well-developed.  Cardiovascular:     Rate and Rhythm: Normal rate.     Heart sounds: Normal heart sounds. No murmur heard. Pulmonary:     Effort: Pulmonary effort is normal.     Breath sounds: Normal breath sounds. No wheezing or rales.  Chest:     Chest wall: No tenderness.  Abdominal:     General: Bowel sounds are normal. There is no distension.     Palpations: Abdomen is soft. There is no mass.     Tenderness: There is no abdominal tenderness.  Musculoskeletal:     Right lower leg: No edema.     Left lower leg: No edema.     Comments: Tenderness on palpation of sacral region and pain on associated range of motion.   Skin:    Comments: Bruise in sacrum Right foot with callus on medial aspect of big toe   Neurological:     Mental Status: She is alert and oriented to person, place, and time.  Psychiatric:        Mood and Affect: Mood normal.        Latest Ref  Rng & Units 04/16/2020   10:37 AM 12/23/2019    9:55 AM 12/16/2019   10:57 AM  CMP  Glucose 65 - 99 mg/dL 87   89   BUN 6 - 24 mg/dL 17   16   Creatinine 0.57 - 1.00 mg/dL 0.88   1.05   Sodium 134 - 144 mmol/L 142   137   Potassium 3.5 - 5.2 mmol/L 4.6   4.6   Chloride 96 - 106 mmol/L 104   102   CO2 20 - 29 mmol/L 24   25   Calcium 8.7 - 10.2 mg/dL 10.5  10.8  10.9   Total Protein 6.0 - 8.5 g/dL   6.5   Total Bilirubin 0.0 - 1.2 mg/dL   0.6   Alkaline Phos 44 - 121 IU/L   113   AST 0 - 40 IU/L   14   ALT 0 - 32 IU/L   13     Lipid Panel     Component Value Date/Time   CHOL 155 05/22/2017 1136   TRIG 124 05/22/2017 1136   HDL 47 05/22/2017 1136   CHOLHDL 3.3 05/22/2017 1136   CHOLHDL 8.0 (H) 10/28/2015 1104   VLDL 60 (H) 10/28/2015 1104   LDLCALC 83 05/22/2017 1136    CBC    Component Value Date/Time   WBC 8.5 12/16/2019 1057   WBC 9.3 12/18/2014 1853   RBC 5.16 12/16/2019 1057   RBC 5.18 (H) 12/18/2014 1853   HGB 14.7 12/16/2019 1057   HCT 43.8 12/16/2019 1057   PLT 225 12/16/2019 1057   MCV 85 12/16/2019 1057   MCH 28.5 12/16/2019 1057   MCH 29.7 12/18/2014 1853   MCHC 33.6 12/16/2019 1057   MCHC 35.6 12/18/2014 1853   RDW 13.1 12/16/2019 1057   LYMPHSABS 1.8 12/16/2019 1057   EOSABS 0.1 12/16/2019 1057   BASOSABS 0.1 12/16/2019 1057    Lab Results  Component Value Date   HGBA1C 5.7 (H) 10/08/2013    Lab Results  Component Value Date   TSH 1.270 12/16/2019    Assessment & Plan:  1. Essential hypertension Uncontrolled due to running out of medications which I have refilled Reassess at next visit Counseled on blood pressure goal of less than 130/80, low-sodium, DASH diet, medication compliance, 150 minutes of moderate intensity exercise per week. Discussed medication compliance, adverse  effects. - lisinopril (ZESTRIL) 5 MG tablet; TAKE 1 TABLET (5 MG TOTAL) BY MOUTH DAILY.  Dispense: 30 tablet; Refill: 6 - CMP14+EGFR  2. Other hyperlipidemia Likely uncontrolled due to running out of statin which I have refilled Low-cholesterol diet - atorvastatin (LIPITOR) 20 MG tablet; Take 1 tablet (20 mg total) by mouth daily.  Dispense: 30 tablet; Refill: 6  3. Pedal edema She has been out of Lasix which I have refilled Encouraged to comply with a low-sodium diet, elevate feet, use compression stockings  - furosemide (LASIX) 20 MG tablet; TAKE 1 TABLET (20 MG TOTAL) BY MOUTH DAILY AS NEEDED FOR EDEMA.  Dispense: 30 tablet; Refill: 3  4. Bilateral sciatica Uncontrolled We will order spine x-ray and consider referral for PT after Meloxicam added to regimen - DG Lumbar Spine Complete; Future - meloxicam (MOBIC) 7.5 MG tablet; Take 1 tablet (7.5 mg total) by mouth daily.  Dispense: 30 tablet; Refill: 1  5. Pain in both feet - Ambulatory referral to Podiatry  6. Encounter for screening mammogram for malignant neoplasm of breast MM DIGITAL SCREENING BILATERAL; Future  7. Need for  hepatitis C screening test - HCV Ab w Reflex to Quant PCR  8. Screening for colon cancer - Fecal occult blood, imunochemical([facility])  9. Sacral pain Secondary to fall Advised that management is conservative - DG Sacrum/Coccyx; Future - meloxicam (MOBIC) 7.5 MG tablet; Take 1 tablet (7.5 mg total) by mouth daily.  Dispense: 30 tablet; Refill: 1  10. Need for shingles vaccine - meloxicam (MOBIC) 7.5 MG tablet; Take 1 tablet (7.5 mg total) by mouth daily.  Dispense: 30 tablet; Refill: 1    Meds ordered this encounter  Medications   lisinopril (ZESTRIL) 5 MG tablet    Sig: TAKE 1 TABLET (5 MG TOTAL) BY MOUTH DAILY.    Dispense:  30 tablet    Refill:  6   atorvastatin (LIPITOR) 20 MG tablet    Sig: Take 1 tablet (20 mg total) by mouth daily.    Dispense:  30 tablet    Refill:  6    furosemide (LASIX) 20 MG tablet    Sig: TAKE 1 TABLET (20 MG TOTAL) BY MOUTH DAILY AS NEEDED FOR EDEMA.    Dispense:  30 tablet    Refill:  3   meloxicam (MOBIC) 7.5 MG tablet    Sig: Take 1 tablet (7.5 mg total) by mouth daily.    Dispense:  30 tablet    Refill:  1    Follow-up: Return in about 1 month (around 09/01/2021) for CPE/ Preventive Health Exam.     Visit required 46 minutes of patient care including median intraservice time reviewing previous notes and test results, counseling patient on diagnosis and work up of back pain in addition to management of chronic medical conditions.Time also spent ordering medications, investigations and documenting in the chart.  All questions were answered to the patient's satisfaction    Charlott Rakes, MD, FAAFP. [facility] and [facility], [place]   08/02/2021, 5:36 PM

## 2021-08-03 LAB — CMP14+EGFR
ALT: 16 IU/L (ref 0–32)
AST: 15 IU/L (ref 0–40)
Albumin/Globulin Ratio: 1.8 (ref 1.2–2.2)
Albumin: 4.4 g/dL (ref 3.8–4.9)
Alkaline Phosphatase: 95 IU/L (ref 44–121)
BUN/Creatinine Ratio: 19 (ref 9–23)
BUN: 20 mg/dL (ref 6–24)
Bilirubin Total: 0.5 mg/dL (ref 0.0–1.2)
CO2: 21 mmol/L (ref 20–29)
Calcium: 10.6 mg/dL — ABNORMAL HIGH (ref 8.7–10.2)
Chloride: 102 mmol/L (ref 96–106)
Creatinine, Ser: 1.06 mg/dL — ABNORMAL HIGH (ref 0.57–1.00)
Globulin, Total: 2.4 g/dL (ref 1.5–4.5)
Glucose: 93 mg/dL (ref 70–99)
Potassium: 4.5 mmol/L (ref 3.5–5.2)
Sodium: 142 mmol/L (ref 134–144)
Total Protein: 6.8 g/dL (ref 6.0–8.5)
eGFR: 61 mL/min/{1.73_m2} (ref >59)

## 2021-08-03 LAB — HCV AB W REFLEX TO QUANT PCR: HCV Ab: NONREACTIVE

## 2021-08-03 LAB — HCV INTERPRETATION

## 2021-08-05 ENCOUNTER — Other Ambulatory Visit (HOSPITAL_COMMUNITY): Payer: Self-pay | Admitting: Psychiatry

## 2021-08-05 DIAGNOSIS — E559 Vitamin D deficiency, unspecified: Secondary | ICD-10-CM

## 2021-08-06 ENCOUNTER — Other Ambulatory Visit (HOSPITAL_COMMUNITY): Payer: Self-pay

## 2021-08-06 MED ORDER — VITAMIN D (ERGOCALCIFEROL) 1.25 MG (50000 UNIT) PO CAPS
ORAL_CAPSULE | ORAL | 0 refills | Status: AC
  Filled 2021-08-06: qty 4, 28d supply, fill #0
  Filled 2021-10-25: qty 16, 112d supply, fill #0
  Filled 2021-11-23: qty 4, 28d supply, fill #0
  Filled 2021-12-09: qty 16, 112d supply, fill #0
  Filled 2021-12-22: qty 4, 28d supply, fill #0
  Filled 2022-01-21: qty 12, 84d supply, fill #0

## 2021-08-09 ENCOUNTER — Other Ambulatory Visit: Payer: Self-pay

## 2021-08-10 ENCOUNTER — Encounter (HOSPITAL_COMMUNITY): Payer: Self-pay | Admitting: Pharmacist

## 2021-08-10 ENCOUNTER — Other Ambulatory Visit (HOSPITAL_COMMUNITY): Payer: Self-pay

## 2021-08-14 ENCOUNTER — Other Ambulatory Visit (HOSPITAL_COMMUNITY): Payer: Self-pay

## 2021-09-01 ENCOUNTER — Encounter: Payer: Medicaid Other | Admitting: Family Medicine

## 2021-10-06 ENCOUNTER — Other Ambulatory Visit: Payer: Self-pay

## 2021-10-06 NOTE — Progress Notes (Signed)
Patient appearing on report for [employer] [facility] - Hypertension Control report due to last documented ambulatory blood pressure of 171/98 on 08/02/2021. Next appointment with PCP is 10/11/2021.   Outreached patient to discuss hypertension control and medication management. Voicemail was full and unable to leave patient a message.   Joseph Art, Pharm.D. PGY-2 Ambulatory Care Pharmacy Resident 10/06/2021 10:12 AM

## 2021-10-11 ENCOUNTER — Ambulatory Visit: Payer: Medicaid Other | Attending: Family Medicine | Admitting: Family Medicine

## 2021-10-11 ENCOUNTER — Encounter: Payer: Self-pay | Admitting: Family Medicine

## 2021-10-11 VITALS — BP 162/88 | HR 84 | Temp 98.7°F | Ht 65.0 in | Wt 178.2 lb

## 2021-10-11 DIAGNOSIS — Z0001 Encounter for general adult medical examination with abnormal findings: Secondary | ICD-10-CM

## 2021-10-11 DIAGNOSIS — N644 Mastodynia: Secondary | ICD-10-CM

## 2021-10-11 DIAGNOSIS — Z23 Encounter for immunization: Secondary | ICD-10-CM

## 2021-10-11 DIAGNOSIS — Z720 Tobacco use: Secondary | ICD-10-CM

## 2021-10-11 DIAGNOSIS — Z0000 Encounter for general adult medical examination without abnormal findings: Secondary | ICD-10-CM

## 2021-10-11 DIAGNOSIS — I10 Essential (primary) hypertension: Secondary | ICD-10-CM

## 2021-10-11 DIAGNOSIS — F17210 Nicotine dependence, cigarettes, uncomplicated: Secondary | ICD-10-CM

## 2021-10-11 MED ORDER — LISINOPRIL 10 MG PO TABS: 10.0000 mg | ORAL_TABLET | Freq: Every day | ORAL | 6 refills | Status: DC

## 2021-10-11 NOTE — Progress Notes (Signed)
Subjective:  Patient ID: Stacy Barrett, female    DOB: 11/30/1965  Age: 56 y.o. MRN: [id number]  CC: Annual Exam   HPI Stacy Barrett is a 56 y.o. year old female with a history of hypertension, hyperlipidemia, tobacco abuse, anxiety (followed by [facility] behavioral health) who presents for an annual physical exam.  Interval History: She is not due for cervical cancer screening till 12/2022.  At her last visit breast cancer and colon cancer screening test were ordered but she is yet to undergo her mammogram and has not turned in her FIT test. Today she noticed when she bumped her right arm against the lateral aspect of her right breast she had sensitivity of that area but denies presence of breast lump and has no left breast symptoms.  Her blood pressure was elevated at her last visit and she had been out of her antihypertensives which were restarted at her last visit.  BP still elevated today despite the fact that she took her lisinopril.  Also endorses presence of intermittent left leg swelling which she states is absent at the moment.  She could not undergo the MRI as she has shooting pain from her tail bone radiating down her posterior RIGHT LOWER EXTREMITY. Her legs feel weak.  She has been unable to undergo the MRI because she has no medical coverage and will need to pay for this out of pocket and is currently depending on her mom's income which is dwidling fast as her mom is terminally ill.  She tried to apply for the [facility] health financial assistance but had a problem in the past.  She is not ready to quit smoking but is working towards 12/2021 as a quit date.  In the past she felt terrible after she has been without cigarettes for 3 days. Past Medical History:  Diagnosis Date   Anxiety    Depression    Edema    Hypertension    Lumbar radiculopathy    Pituitary adenoma ([place])    Vertebral fracture     History reviewed. No pertinent surgical history.  Family History   Problem Relation Age of Onset   CAD Mother     Social History   Socioeconomic History   Marital status: Unknown    Spouse name: Not on file   Number of children: Not on file   Years of education: Not on file   Highest education level: Not on file  Occupational History   Not on file  Tobacco Use   Smoking status: Every Day    Packs/day: 0.50    Types: Cigarettes   Smokeless tobacco: Never  Vaping Use   Vaping Use: Former  Substance and Sexual Activity   Alcohol use: Yes    Alcohol/week: 1.0 standard drink of alcohol    Types: 1 Glasses of wine per week    Comment: "hardly ever"   Drug use: Yes    Frequency: 2.0 times per week    Types: Marijuana   Sexual activity: Not on file  Other Topics Concern   Not on file  Social History Narrative   Not on file   Social Determinants of Health   Financial Resource Strain: Not on file  Food Insecurity: Not on file  Transportation Needs: Not on file  Physical Activity: Not on file  Stress: Not on file  Social Connections: Not on file    Allergies  Allergen Reactions   Other Other (See Comments)    MSG -Very  intense headaches   Penicillins Hives   Prednisone Itching, Swelling and Rash    Swelling of lips and face   Sulfa Antibiotics Hives and Itching    Outpatient Medications Prior to Visit  Medication Sig Dispense Refill   ARIPiprazole (ABILIFY) 2 MG tablet Take 1 tablet (2 mg total) by mouth daily. 30 tablet 3   atorvastatin (LIPITOR) 20 MG tablet Take 1 tablet (20 mg total) by mouth daily. 30 tablet 6   busPIRone (BUSPAR) 10 MG tablet Take 1 tablet (10 mg total) by mouth 3 (three) times daily. 90 tablet 3   cetirizine (ZYRTEC) 10 MG tablet TAKE 1 TABLET (10 MG TOTAL) BY MOUTH DAILY. 30 tablet 1   fluticasone (FLONASE) 50 MCG/ACT nasal spray PLACE 2 SPRAYS INTO BOTH NOSTRILS DAILY. 16 g 6   furosemide (LASIX) 20 MG tablet TAKE 1 TABLET (20 MG TOTAL) BY MOUTH DAILY AS NEEDED FOR EDEMA. 30 tablet 3   gabapentin  (NEURONTIN) 300 MG capsule TAKE 1 CAPSULE BY MOUTH THREE TIMES A DAY 90 capsule 3   meloxicam (MOBIC) 7.5 MG tablet Take 1 tablet (7.5 mg total) by mouth daily. 30 tablet 1   sertraline (ZOLOFT) 100 MG tablet Take 1.5 tablets (150 mg total) by mouth daily. 45 tablet 3   Vitamin D, Ergocalciferol, (DRISDOL) 1.25 MG (50000 UNIT) CAPS capsule Take 1 capsule by mouth every 7 (seven) days. 16 capsule 0   lisinopril (ZESTRIL) 5 MG tablet TAKE 1 TABLET (5 MG TOTAL) BY MOUTH DAILY. 30 tablet 6   No facility-administered medications prior to visit.     ROS Review of Systems  Constitutional:  Negative for activity change and appetite change.  HENT:  Negative for sinus pressure and sore throat.   Respiratory:  Negative for chest tightness, shortness of breath and wheezing.   Cardiovascular:  Negative for chest pain and palpitations.  Gastrointestinal:  Negative for abdominal distention, abdominal pain and constipation.  Genitourinary: Negative.   Musculoskeletal:        See HPI  Psychiatric/Behavioral:  Positive for dysphoric mood. Negative for behavioral problems.     Objective:  BP (!) 162/88   Pulse 84   Temp 98.7 F (37.1 C) (Oral)   Ht '5\' 5"'$  (1.651 m)   Wt 178 lb 3.2 oz (80.8 kg)   SpO2 98%   BMI 29.65 kg/m      10/11/2021   10:32 AM 08/02/2021    2:37 PM 01/27/2021    4:48 PM  BP/Weight  Systolic BP 604 540   Diastolic BP 88 98   Wt. (Lbs) 178.2 193   BMI 29.65 kg/m2 32.12 kg/m2      Information is confidential and restricted. Go to Review Flowsheets to unlock data.      Physical Exam Constitutional:      General: She is not in acute distress.    Appearance: She is well-developed. She is not diaphoretic.  HENT:     Head: Normocephalic.     Right Ear: External ear normal. There is impacted cerumen.     Left Ear: External ear normal. There is impacted cerumen.     Nose: Nose normal.     Mouth/Throat:     Mouth: Mucous membranes are moist.  Eyes:     Extraocular  Movements: Extraocular movements intact.     Conjunctiva/sclera: Conjunctivae normal.     Pupils: Pupils are equal, round, and reactive to light.  Neck:     Vascular: No JVD.  Cardiovascular:  Rate and Rhythm: Normal rate and regular rhythm.     Pulses: Normal pulses.     Heart sounds: Normal heart sounds. No murmur heard.    No gallop.  Pulmonary:     Effort: Pulmonary effort is normal. No respiratory distress.     Breath sounds: Normal breath sounds. No wheezing or rales.  Chest:     Chest wall: No tenderness.  Breasts:    Right: Tenderness (lateral aspect of R breast) present. No mass.     Left: No mass or tenderness.  Abdominal:     General: Bowel sounds are normal. There is no distension.     Palpations: Abdomen is soft. There is no mass.     Tenderness: There is no abdominal tenderness.  Musculoskeletal:        General: No tenderness. Normal range of motion.     Cervical back: Normal range of motion.  Skin:    General: Skin is warm and dry.  Neurological:     Mental Status: She is alert and oriented to person, place, and time.     Deep Tendon Reflexes: Reflexes are normal and symmetric.  Psychiatric:     Comments: Dysphoric mood        Latest Ref Rng & Units 08/02/2021    3:30 PM 04/16/2020   10:37 AM 12/23/2019    9:55 AM  CMP  Glucose 70 - 99 mg/dL 93  87    BUN 6 - 24 mg/dL 20  17    Creatinine 0.57 - 1.00 mg/dL 1.06  0.88    Sodium 134 - 144 mmol/L 142  142    Potassium 3.5 - 5.2 mmol/L 4.5  4.6    Chloride 96 - 106 mmol/L 102  104    CO2 20 - 29 mmol/L 21  24    Calcium 8.7 - 10.2 mg/dL 10.6  10.5  10.8   Total Protein 6.0 - 8.5 g/dL 6.8     Total Bilirubin 0.0 - 1.2 mg/dL 0.5     Alkaline Phos 44 - 121 IU/L 95     AST 0 - 40 IU/L 15     ALT 0 - 32 IU/L 16       Lipid Panel     Component Value Date/Time   CHOL 155 05/22/2017 1136   TRIG 124 05/22/2017 1136   HDL 47 05/22/2017 1136   CHOLHDL 3.3 05/22/2017 1136   CHOLHDL 8.0 (H) 10/28/2015 1104    VLDL 60 (H) 10/28/2015 1104   LDLCALC 83 05/22/2017 1136    CBC    Component Value Date/Time   WBC 8.5 12/16/2019 1057   WBC 9.3 12/18/2014 1853   RBC 5.16 12/16/2019 1057   RBC 5.18 (H) 12/18/2014 1853   HGB 14.7 12/16/2019 1057   HCT 43.8 12/16/2019 1057   PLT 225 12/16/2019 1057   MCV 85 12/16/2019 1057   MCH 28.5 12/16/2019 1057   MCH 29.7 12/18/2014 1853   MCHC 33.6 12/16/2019 1057   MCHC 35.6 12/18/2014 1853   RDW 13.1 12/16/2019 1057   LYMPHSABS 1.8 12/16/2019 1057   EOSABS 0.1 12/16/2019 1057   BASOSABS 0.1 12/16/2019 1057    Lab Results  Component Value Date   HGBA1C 5.7 (H) 10/08/2013    Assessment & Plan:  1. Annual physical exam Counseled on 150 minutes of exercise per week, healthy eating (including decreased daily intake of saturated fats, cholesterol, added sugars, sodium),routine healthcare maintenance.   2. Essential hypertension Uncontrolled Increase dose  of lisinopril Counseled on blood pressure goal of less than 130/80, low-sodium, DASH diet, medication compliance, 150 minutes of moderate intensity exercise per week. Discussed medication compliance, adverse effects. - lisinopril (ZESTRIL) 10 MG tablet; Take 1 tablet (10 mg total) by mouth daily.  Dispense: 30 tablet; Refill: 6  3. Mastodynia of right breast - US BREAST LTD UNI RIGHT INC AXILLA; Future - MM DIAG BREAST TOMO BILATERAL; Future  4. Tobacco abuse Spent 3 minutes counseling on smoking cessation and she is not ready to quit.  Currently being stressed from being the major caregiver for her mom who is terminally ill. She is working towards quitting in 3 months She will be an ideal candidate for bupropion however she is on other psychotropic medications and has been advised to discuss this with her behavioral health clinician so that she can be commenced on this.  5. Need for shingles vaccine Second dose shingles vaccine administered  6. Need for immunization against influenza - Flu  Vaccine QUAD 85moIM (Fluarix, Fluzone & Alfiuria Quad PF)  Advised to obtain paperwork for the [employer] at the front desk.  Health Care Maintenance: She previously received a FIT test and has been advised to bring this into the office  Meds ordered this encounter  Medications   lisinopril (ZESTRIL) 10 MG tablet    Sig: Take 1 tablet (10 mg total) by mouth daily.    Dispense:  30 tablet    Refill:  6    Dose increased    Follow-up: Return in about 3 months (around 01/11/2022) for Blood Pressure follow-up.       ECharlott Rakes MD, FAAFP. [facility] [place]  10/11/2021, 11:43 AM

## 2021-10-11 NOTE — Patient Instructions (Signed)

## 2021-10-25 ENCOUNTER — Other Ambulatory Visit (HOSPITAL_COMMUNITY): Payer: Self-pay

## 2021-10-25 ENCOUNTER — Telehealth (INDEPENDENT_AMBULATORY_CARE_PROVIDER_SITE_OTHER): Payer: No Payment, Other | Admitting: Psychiatry

## 2021-10-25 ENCOUNTER — Encounter (HOSPITAL_COMMUNITY): Payer: Self-pay | Admitting: Psychiatry

## 2021-10-25 ENCOUNTER — Other Ambulatory Visit: Payer: Self-pay | Admitting: Family Medicine

## 2021-10-25 DIAGNOSIS — J3089 Other allergic rhinitis: Secondary | ICD-10-CM

## 2021-10-25 DIAGNOSIS — F411 Generalized anxiety disorder: Secondary | ICD-10-CM

## 2021-10-25 DIAGNOSIS — F331 Major depressive disorder, recurrent, moderate: Secondary | ICD-10-CM | POA: Diagnosis not present

## 2021-10-25 MED ORDER — ARIPIPRAZOLE 2 MG PO TABS: 2.0000 mg | ORAL_TABLET | Freq: Every day | ORAL | 3 refills | Status: DC

## 2021-10-25 MED ORDER — SERTRALINE HCL 100 MG PO TABS: 150.0000 mg | ORAL_TABLET | Freq: Every day | ORAL | 3 refills | Status: DC

## 2021-10-25 MED ORDER — BUSPIRONE HCL 15 MG PO TABS: 15.0000 mg | ORAL_TABLET | Freq: Two times a day (BID) | ORAL | 3 refills | Status: DC

## 2021-10-25 MED ORDER — GABAPENTIN 300 MG PO CAPS: ORAL_CAPSULE | ORAL | 3 refills | Status: DC

## 2021-10-25 NOTE — Progress Notes (Signed)
BH MD/PA/NP OP Progress Note Virtual Visit via Video Note  I connected with Stacy Barrett on 10/25/21 at  2:30 PM EDT by a video enabled telemedicine application and verified that I am speaking with the correct person using two identifiers.  Location: Patient: Home Provider: Clinic   I discussed the limitations of evaluation and management by telemedicine and the availability of in person appointments. The patient expressed understanding and agreed to proceed.  I provided 30 minutes of non-face-to-face time during this encounter.    10/25/2021 3:08 PM CANDANCE BOHLMAN  MRN:  [id number]  Chief Complaint: "The Abilify has helped"  HPI: 56 year old female seen today for follow-up psychiatric evaluation.  She has a psychiatric history of anxiety, depression, panic disorder, and tobacco dependence.  She is currently managed on BuSpar 10 mg 3 times daily, gabapentin 300 mg 3 times daily, and Zoloft 150 mg daily.  She informed Probation officer that that she has only been taking BuSpar twice daily and reports that her other medications are somewhat effective in managing her psychiatric addition.  Today she is well-groomed, pleasant, cooperative, engaged in conversation, and maintained eye contact.  She informed Probation officer that the Abilify has been helpful.  She notes that she is less depressed.  Patient notes that she continues to struggle with situational stressors that exacerbate her mental health.  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  She informed Probation officer that at times her mother says hurtful things.  She notes that she is considering having her placed in a nursing home.  Patient asked writer if she knew of any facilities that take Medicare.  Provider gave patient names of several nursing facilities in [place] that takes Medicare.  Since her last visit she notes that her anxiety and depression has somewhat improved.  Provider conducted a GAD-7 and patient scored an 11,  at her last visit she scored an 54.  Provider also conducted PHQ-9 and patient scored a 14, at her last visit she scored a 19.  She endorses sleeping 3 to 5 hours nightly.  Today she endorses passive SI however denies wanting to harm herself.  She denies SI/HI/VAH, mania, or paranoia.  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  Patient informed Probation officer that BuSpar 3 times daily caused sedation.  She however notes that she would be interested in having an increase in dose.  Provider recommended taking BuSpar 15 mg twice daily.  Provider informed patient that if BuSpar is over sedating to take both tablets at night.  She endorsed understanding and agreed.  She will continue her other medications as prescribed.  Provider recommended counseling however at this time patient notes that she does not want to see a counselor.  No other concerns at this time.  Visit Diagnosis:    ICD-10-CM   1. Moderate episode of recurrent major depressive disorder (HCC)  F33.1 ARIPiprazole (ABILIFY) 2 MG tablet    busPIRone (BUSPAR) 15 MG tablet    gabapentin (NEURONTIN) 300 MG capsule    sertraline (ZOLOFT) 100 MG tablet    2. GAD (generalized anxiety disorder)  F41.1 busPIRone (BUSPAR) 15 MG tablet    gabapentin (NEURONTIN) 300 MG capsule    sertraline (ZOLOFT) 100 MG tablet      Past Psychiatric History: anxiety, depression, panic disorder, and tobacco dependence  Past Medical History:  Past Medical History:  Diagnosis Date   Anxiety  Depression    Edema    Hypertension    Lumbar radiculopathy    Pituitary adenoma ([place])    Vertebral fracture    History reviewed. No pertinent surgical history.  Family Psychiatric History: Na  Family History:  Family History  Problem Relation Age of Onset   CAD Mother     Social History:  Social History   Socioeconomic History   Marital status: Unknown    Spouse name: Not on file   Number of  children: Not on file   Years of education: Not on file   Highest education level: Not on file  Occupational History   Not on file  Tobacco Use   Smoking status: Every Day    Packs/day: 0.50    Types: Cigarettes   Smokeless tobacco: Never  Vaping Use   Vaping Use: Former  Substance and Sexual Activity   Alcohol use: Yes    Alcohol/week: 1.0 standard drink of alcohol    Types: 1 Glasses of wine per week    Comment: "hardly ever"   Drug use: Yes    Frequency: 2.0 times per week    Types: Marijuana   Sexual activity: Not on file  Other Topics Concern   Not on file  Social History Narrative   Not on file   Social Determinants of Health   Financial Resource Strain: Not on file  Food Insecurity: Not on file  Transportation Needs: Not on file  Physical Activity: Not on file  Stress: Not on file  Social Connections: Not on file    Allergies:  Allergies  Allergen Reactions   Other Other (See Comments)    MSG -Very intense headaches   Penicillins Hives   Prednisone Itching, Swelling and Rash    Swelling of lips and face   Sulfa Antibiotics Hives and Itching    Metabolic Disorder Labs: Lab Results  Component Value Date   HGBA1C 5.7 (H) 10/08/2013   MPG 117 (H) 10/08/2013   Lab Results  Component Value Date   PROLACTIN 37.5 (H) 10/28/2015   PROLACTIN 36.8 07/18/2014   Lab Results  Component Value Date   CHOL 155 05/22/2017   TRIG 124 05/22/2017   HDL 47 05/22/2017   CHOLHDL 3.3 05/22/2017   VLDL 60 (H) 10/28/2015   LDLCALC 83 05/22/2017   LDLCALC 86 06/14/2016   Lab Results  Component Value Date   TSH 1.270 12/16/2019   TSH 1.130 12/06/2017    Therapeutic Level Labs: Lab Results  Component Value Date   LITHIUM <0.25 (L) 10/03/2013   No results found for: "VALPROATE" No results found for: "CBMZ"  Current Medications: Current Outpatient Medications  Medication Sig Dispense Refill   ARIPiprazole (ABILIFY) 2 MG tablet Take 1 tablet (2 mg total) by  mouth daily. 30 tablet 3   atorvastatin (LIPITOR) 20 MG tablet Take 1 tablet (20 mg total) by mouth daily. 30 tablet 6   busPIRone (BUSPAR) 15 MG tablet Take 1 tablet (15 mg total) by mouth 2 (two) times daily. 30 tablet 3   cetirizine (ZYRTEC) 10 MG tablet TAKE 1 TABLET (10 MG TOTAL) BY MOUTH DAILY. 30 tablet 1   fluticasone (FLONASE) 50 MCG/ACT nasal spray PLACE 2 SPRAYS INTO BOTH NOSTRILS DAILY. 16 g 6   furosemide (LASIX) 20 MG tablet TAKE 1 TABLET (20 MG TOTAL) BY MOUTH DAILY AS NEEDED FOR EDEMA. 30 tablet 3   gabapentin (NEURONTIN) 300 MG capsule TAKE 1 CAPSULE BY MOUTH THREE TIMES A DAY 90 capsule  3   lisinopril (ZESTRIL) 10 MG tablet Take 1 tablet (10 mg total) by mouth daily. 30 tablet 6   meloxicam (MOBIC) 7.5 MG tablet Take 1 tablet (7.5 mg total) by mouth daily. 30 tablet 1   sertraline (ZOLOFT) 100 MG tablet Take 1.5 tablets (150 mg total) by mouth daily. 45 tablet 3   Vitamin D, Ergocalciferol, (DRISDOL) 1.25 MG (50000 UNIT) CAPS capsule Take 1 capsule by mouth every 7 (seven) days. 16 capsule 0   No current facility-administered medications for this visit.     Musculoskeletal: Strength & Muscle Tone: within normal limits and  telehealth visit Gait & Station: normal, telehealthvisit Patient leans: N/A  Psychiatric Specialty Exam: Review of Systems  There were no vitals taken for this visit.There is no height or weight on file to calculate BMI.  General Appearance: Well Groomed  Eye Contact:  Good  Speech:  Clear and Coherent and Normal Rate  Volume:  Normal  Mood:  Anxious, Depressed, and improving  Affect:  Appropriate, Congruent, and Tearful  Thought Process:  Coherent, Goal Directed, and Linear  Orientation:  Full (Time, Place, and Person)  Thought Content: WDL and Logical   Suicidal Thoughts:  No  Homicidal Thoughts:  No  Memory:  Immediate;   Good Recent;   Good Remote;   Good  Judgement:  Good  Insight:  Good  Psychomotor Activity:  Normal  Concentration:   Concentration: Good and Attention Span: Good  Recall:  Good  Fund of Knowledge: Good  Language: Good  Akathisia:  No  Handed:  Right  AIMS (if indicated): not done  Assets:  Communication Skills Desire for Improvement Financial Resources/Insurance Housing Physical Health  ADL's:  Intact  Cognition: WNL  Sleep:  Fair   Screenings: AIMS    Flowsheet Row Video Visit from 04/20/2020 in [place] Total Score 0      AUDIT    Flowsheet Row Admission (Discharged) from 10/06/2013 in [facility]  Alcohol Use Disorder Identification Test Final Score (AUDIT) 0      GAD-7    Flowsheet Row Video Visit from 10/25/2021 in [facility] Office Visit from 10/11/2021 in [place] Office Visit from 08/02/2021 in [place] Video Visit from 07/22/2021 in [place] Office Visit from 03/17/2020 in [place]  Total GAD-7 Score '11 6 18 18 4      '$ PHQ2-9    Flowsheet Row Video Visit from 10/25/2021 in [facility] Office Visit from 10/11/2021 in [place] Office Visit from 08/02/2021 in [place] Video Visit from 07/22/2021 in [facility] Video Visit from 04/20/2020 in [place]  PHQ-2 Total Score '4 2 4 5 1  '$ PHQ-9 Total Score '14 6 19 19 '$ --      Flowsheet Row Video Visit from 10/25/2021 in [facility] Video Visit from 07/22/2021 in [facility] Video Visit from 04/20/2020 in [place] Error: Q7 should not be populated when Q6 is No Error: Q7 should not be populated when Q6 is No No Risk        Assessment and Plan: Patient notes that her  anxiety and depression has somewhat improved since her last visit.  She  did Artist that she would be interested in dose adjustments to help manage her anxiety and depression.  Patient informed Probation officer that BuSpar 3 times daily caused sedation.  She however notes that she would be interested in having an increase in dose.  Provider recommended taking BuSpar 15 mg twice daily.  Provider informed patient that if BuSpar is over sedating to take both tablets at night.  She endorsed understanding and agreed.  She will continue her other medications as prescribed  1. Moderate episode of recurrent major depressive disorder (HCC)  Continue- ARIPiprazole (ABILIFY) 2 MG tablet; Take 1 tablet (2 mg total) by mouth daily.  Dispense: 30 tablet; Refill: 3 Increased- busPIRone (BUSPAR) 15 MG tablet; Take 1 tablet (15 mg total) by mouth 2 (two) times daily.  Dispense: 30 tablet; Refill: 3 Continue- gabapentin (NEURONTIN) 300 MG capsule; TAKE 1 CAPSULE BY MOUTH THREE TIMES A DAY  Dispense: 90 capsule; Refill: 3 Continue- sertraline (ZOLOFT) 100 MG tablet; Take 1.5 tablets (150 mg total) by mouth daily.  Dispense: 45 tablet; Refill: 3  2. GAD (generalized anxiety disorder)  Increased- busPIRone (BUSPAR) 15 MG tablet; Take 1 tablet (15 mg total) by mouth 2 (two) times daily.  Dispense: 30 tablet; Refill: 3 Continue- gabapentin (NEURONTIN) 300 MG capsule; TAKE 1 CAPSULE BY MOUTH THREE TIMES A DAY  Dispense: 90 capsule; Refill: 3 Continue- sertraline (ZOLOFT) 100 MG tablet; Take 1.5 tablets (150 mg total) by mouth daily.  Dispense: 45 tablet; Refill: 3   Follow-up in 3 months Salley Slaughter, NP 10/25/2021, 3:08 PM

## 2021-10-26 ENCOUNTER — Other Ambulatory Visit (HOSPITAL_COMMUNITY): Payer: Self-pay

## 2021-10-26 MED ORDER — FLUTICASONE PROPIONATE 50 MCG/ACT NA SUSP
2.0000 | Freq: Every day | NASAL | 3 refills | Status: AC
  Filled 2021-10-26: qty 48, 90d supply, fill #0
  Filled 2021-11-23 – 2021-12-22 (×3): qty 16, 30d supply, fill #0
  Filled 2022-01-21: qty 48, 90d supply, fill #0
  Filled 2022-10-12: qty 48, 90d supply, fill #1

## 2021-10-26 MED ORDER — CETIRIZINE HCL 10 MG PO TABS
10.0000 mg | ORAL_TABLET | Freq: Every day | ORAL | 3 refills | Status: AC
  Filled 2021-10-26 – 2022-01-21 (×3): qty 90, 90d supply, fill #0

## 2021-10-26 NOTE — Telephone Encounter (Signed)
Requested Prescriptions  Pending Prescriptions Disp Refills  . fluticasone (FLONASE) 50 MCG/ACT nasal spray 16 g 6    Sig: PLACE 2 SPRAYS INTO BOTH NOSTRILS DAILY.     Ear, Nose, and Throat: Nasal Preparations - Corticosteroids Passed - 10/25/2021  1:23 PM      Passed - Valid encounter within last 12 months    Recent Outpatient Visits          2 weeks ago Annual physical exam   [place], Enobong, MD   2 months ago Bilateral sciatica   [facility] Charlott Rakes, MD   1 year ago Pedal edema   [place], Enobong, MD   1 year ago Annual physical exam   [place], Enobong, MD   3 years ago Annual physical exam   [place], Enobong, MD      Future Appointments            In 2 months Charlott Rakes, MD [place]           . cetirizine (ZYRTEC) 10 MG tablet 30 tablet 1    Sig: TAKE 1 TABLET (10 MG TOTAL) BY MOUTH DAILY.     Ear, Nose, and Throat:  Antihistamines 2 Failed - 10/25/2021  1:23 PM      Failed - Cr in normal range and within 360 days    Creat  Date Value Ref Range Status  10/28/2015 0.64 0.50 - 1.05 mg/dL Final    Comment:      For patients > or = 57 years of age: The upper reference limit for Creatinine is approximately 13% higher for people identified as African-American.      Creatinine, Ser  Date Value Ref Range Status  08/02/2021 1.06 (H) 0.57 - 1.00 mg/dL Final         Passed - Valid encounter within last 12 months    Recent Outpatient Visits          2 weeks ago Annual physical exam   [place], Enobong, MD   2 months ago Bilateral sciatica   [facility] Charlott Rakes, MD   1 year ago Pedal edema   [place], Enobong, MD    1 year ago Annual physical exam   [place], MD   3 years ago Annual physical exam   [place], MD      Future Appointments            In 2 months Charlott Rakes, MD [place]

## 2021-10-27 ENCOUNTER — Other Ambulatory Visit (HOSPITAL_COMMUNITY): Payer: Self-pay

## 2021-10-28 ENCOUNTER — Other Ambulatory Visit (HOSPITAL_COMMUNITY): Payer: Self-pay

## 2021-11-01 ENCOUNTER — Other Ambulatory Visit (HOSPITAL_COMMUNITY): Payer: Self-pay

## 2021-11-02 ENCOUNTER — Other Ambulatory Visit: Payer: Self-pay | Admitting: Family Medicine

## 2021-11-02 ENCOUNTER — Other Ambulatory Visit (HOSPITAL_COMMUNITY): Payer: Self-pay

## 2021-11-02 DIAGNOSIS — R600 Localized edema: Secondary | ICD-10-CM

## 2021-11-03 ENCOUNTER — Other Ambulatory Visit (HOSPITAL_COMMUNITY): Payer: Self-pay

## 2021-11-08 ENCOUNTER — Other Ambulatory Visit (HOSPITAL_COMMUNITY): Payer: Self-pay

## 2021-11-22 ENCOUNTER — Telehealth: Payer: Self-pay

## 2021-11-22 NOTE — Telephone Encounter (Addendum)
Attempted to call patient and schedule mammogram scholarship appointment. Left a voice message.  Attempted to call patient and schedule mammogram scholarship appointment. Left a voice message 02/16/2021

## 2021-11-23 ENCOUNTER — Other Ambulatory Visit (HOSPITAL_COMMUNITY): Payer: Self-pay

## 2021-12-04 ENCOUNTER — Other Ambulatory Visit (HOSPITAL_COMMUNITY): Payer: Self-pay

## 2021-12-09 ENCOUNTER — Other Ambulatory Visit: Payer: Self-pay

## 2021-12-09 ENCOUNTER — Ambulatory Visit (HOSPITAL_COMMUNITY)
Admission: RE | Admit: 2021-12-09 | Discharge: 2021-12-09 | Disposition: A | Discharge status: Home / Self Care | Payer: Medicaid Other | Source: Ambulatory Visit | Attending: Family Medicine | Admitting: Family Medicine

## 2021-12-09 DIAGNOSIS — M5431 Sciatica, right side: Secondary | ICD-10-CM | POA: Insufficient documentation

## 2021-12-09 DIAGNOSIS — M5432 Sciatica, left side: Secondary | ICD-10-CM

## 2021-12-09 DIAGNOSIS — M533 Sacrococcygeal disorders, not elsewhere classified: Secondary | ICD-10-CM

## 2021-12-16 ENCOUNTER — Other Ambulatory Visit: Payer: Self-pay

## 2021-12-22 ENCOUNTER — Other Ambulatory Visit (HOSPITAL_COMMUNITY): Payer: Self-pay

## 2021-12-23 ENCOUNTER — Other Ambulatory Visit: Payer: Self-pay

## 2021-12-23 ENCOUNTER — Encounter (HOSPITAL_COMMUNITY): Payer: Self-pay

## 2021-12-23 ENCOUNTER — Other Ambulatory Visit (HOSPITAL_COMMUNITY): Payer: Self-pay

## 2022-01-03 ENCOUNTER — Other Ambulatory Visit (HOSPITAL_COMMUNITY): Payer: Self-pay

## 2022-01-12 ENCOUNTER — Ambulatory Visit: Payer: Medicaid Other | Admitting: Family Medicine

## 2022-01-21 ENCOUNTER — Other Ambulatory Visit: Payer: Self-pay | Admitting: Family Medicine

## 2022-01-21 ENCOUNTER — Other Ambulatory Visit (HOSPITAL_COMMUNITY): Payer: Self-pay

## 2022-01-21 DIAGNOSIS — R600 Localized edema: Secondary | ICD-10-CM

## 2022-01-24 ENCOUNTER — Other Ambulatory Visit (HOSPITAL_COMMUNITY): Payer: Self-pay

## 2022-01-24 MED ORDER — FUROSEMIDE 20 MG PO TABS
20.0000 mg | ORAL_TABLET | Freq: Every day | ORAL | 0 refills | Status: DC | PRN
  Filled 2022-01-24: qty 90, 90d supply, fill #0

## 2022-01-25 ENCOUNTER — Encounter: Payer: Self-pay | Admitting: Family Medicine

## 2022-01-25 ENCOUNTER — Ambulatory Visit: Payer: Self-pay | Attending: Family Medicine | Admitting: Family Medicine

## 2022-01-25 VITALS — BP 132/68 | HR 87 | Ht 65.0 in | Wt 198.4 lb

## 2022-01-25 DIAGNOSIS — M5432 Sciatica, left side: Secondary | ICD-10-CM

## 2022-01-25 DIAGNOSIS — M5431 Sciatica, right side: Secondary | ICD-10-CM

## 2022-01-25 DIAGNOSIS — I10 Essential (primary) hypertension: Secondary | ICD-10-CM

## 2022-01-25 NOTE — Progress Notes (Signed)
Discuss x-ray results Arthritis in hands.

## 2022-01-25 NOTE — Progress Notes (Signed)
Subjective:  Patient ID: Stacy Barrett, female    DOB: December 25, 1965  Age: 56 y.o. MRN: [id number]  CC: Hypertension   HPI Stacy Barrett is a 56 y.o. year old female with a history of hypertension, hyperlipidemia, tobacco abuse, anxiety (followed by [facility] behavioral health)   Interval History: She presents today for follow-up of her blood pressure as her lisinopril dose had been increased at her last visit and blood pressure is controlled today.  Her back continues to hurt and she is in so much pain at the end of the day. It starts in her mid buttock and radiates down her posterior thigh worsened by bending over. Ibuprofen causes abdominal pain. She took her Mom's Meloxicam and had some relief. I had prescribed Mobic a few months ago which she never picked up until today. She is losing sensation in her right leg and she has been falling. We had discussed an MRI at her last visit but she complained of cost as a major factor.  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  Denies presence of loss of sphincteric function.  X-ray lumbar spine had revealed: IMPRESSION: 1. Multilevel degenerative disc disease, most prominent at L2-L3 and L3-L4. 2. Grade 1 anterolisthesis of L5 on S1. Possible but not definite L5 pars interarticularis defects. 3. L4-L5 and L5-S1 facet hypertrophy. Past Medical History:  Diagnosis Date   Anxiety    Depression    Edema    Hypertension    Lumbar radiculopathy    Pituitary adenoma ([place])    Vertebral fracture     No past surgical history on file.  Family History  Problem Relation Age of Onset   CAD Mother     Social History   Socioeconomic History   Marital status: Divorced    Spouse name: Not on file   Number of children: Not on file   Years of education: Not on file   Highest education level: Not on file   Occupational History   Not on file  Tobacco Use   Smoking status: Every Day    Packs/day: 0.50    Types: Cigarettes   Smokeless tobacco: Never  Vaping Use   Vaping Use: Former  Substance and Sexual Activity   Alcohol use: Yes    Alcohol/week: 1.0 standard drink of alcohol    Types: 1 Glasses of wine per week    Comment: "hardly ever"   Drug use: Yes    Frequency: 2.0 times per week    Types: Marijuana   Sexual activity: Not on file  Other Topics Concern   Not on file  Social History Narrative   Not on file   Social Determinants of Health   Financial Resource Strain: Not on file  Food Insecurity: Not on file  Transportation Needs: Not on file  Physical Activity: Not on file  Stress: Not on file  Social Connections: Not on file    Allergies  Allergen Reactions   Other Other (See Comments)    MSG -Very intense headaches   Penicillins Hives   Prednisone Itching, Swelling and Rash    Swelling of lips and face   Sulfa Antibiotics Hives and Itching    Outpatient Medications Prior to Visit  Medication Sig Dispense Refill   ARIPiprazole (ABILIFY) 2 MG tablet Take 1 tablet (2 mg total)  by mouth daily. 30 tablet 3   atorvastatin (LIPITOR) 20 MG tablet Take 1 tablet (20 mg total) by mouth daily. 30 tablet 6   busPIRone (BUSPAR) 15 MG tablet Take 1 tablet (15 mg total) by mouth 2 (two) times daily. 30 tablet 3   cetirizine (ZYRTEC) 10 MG tablet Take 1 tablet (10 mg total) by mouth daily. 90 tablet 3   fluticasone (FLONASE) 50 MCG/ACT nasal spray Place 2 sprays into both nostrils daily. 48 g 3   furosemide (LASIX) 20 MG tablet Take 1 tablet (20 mg total) by mouth daily as needed. 90 tablet 0   gabapentin (NEURONTIN) 300 MG capsule TAKE 1 CAPSULE BY MOUTH THREE TIMES A DAY 90 capsule 3   lisinopril (ZESTRIL) 10 MG tablet Take 1 tablet (10 mg total) by mouth daily. 30 tablet 6   meloxicam (MOBIC) 7.5 MG tablet Take 1 tablet (7.5 mg total) by mouth daily. 30 tablet 1    sertraline (ZOLOFT) 100 MG tablet Take 1.5 tablets (150 mg total) by mouth daily. 45 tablet 3   Vitamin D, Ergocalciferol, (DRISDOL) 1.25 MG (50000 UNIT) CAPS capsule Take 1 capsule by mouth every 7 (seven) days. 16 capsule 0   No facility-administered medications prior to visit.     ROS Review of Systems  Constitutional:  Negative for activity change and appetite change.  HENT:  Negative for sinus pressure and sore throat.   Respiratory:  Negative for chest tightness, shortness of breath and wheezing.   Cardiovascular:  Negative for chest pain and palpitations.  Gastrointestinal:  Negative for abdominal distention, abdominal pain and constipation.  Genitourinary: Negative.   Musculoskeletal:        See HPI  Psychiatric/Behavioral:  Negative for behavioral problems and dysphoric mood.    Objective:  BP 132/68   Pulse 87   Ht '5\' 5"'$  (1.651 m)   Wt 198 lb 6.4 oz (90 kg)   SpO2 98%   BMI 33.02 kg/m      01/25/2022    4:41 PM 01/25/2022    4:22 PM 10/11/2021   10:32 AM  BP/Weight  Systolic BP 354 562 563  Diastolic BP 68 89 88  Wt. (Lbs)  198.4 178.2  BMI  33.02 kg/m2 29.65 kg/m2      Physical Exam Constitutional:      Appearance: She is well-developed.  Cardiovascular:     Rate and Rhythm: Normal rate.     Heart sounds: Normal heart sounds. No murmur heard. Pulmonary:     Effort: Pulmonary effort is normal.     Breath sounds: Normal breath sounds. No wheezing or rales.  Chest:     Chest wall: No tenderness.  Abdominal:     General: Bowel sounds are normal. There is no distension.     Palpations: Abdomen is soft. There is no mass.     Tenderness: There is no abdominal tenderness.  Musculoskeletal:        General: No tenderness. Normal range of motion.     Right lower leg: No edema.     Left lower leg: No edema.     Comments: Negative straight leg raise bilaterally  Neurological:     Mental Status: She is alert and oriented to person, place, and time.      Sensory: No sensory deficit.     Motor: No weakness.     Gait: Gait normal.     Deep Tendon Reflexes:     Reflex Scores:      Patellar  reflexes are 2+ on the right side and 2+ on the left side. Psychiatric:        Mood and Affect: Mood normal.        Latest Ref Rng & Units 08/02/2021    3:30 PM 04/16/2020   10:37 AM 12/23/2019    9:55 AM  CMP  Glucose 70 - 99 mg/dL 93  87    BUN 6 - 24 mg/dL 20  17    Creatinine 0.57 - 1.00 mg/dL 1.06  0.88    Sodium 134 - 144 mmol/L 142  142    Potassium 3.5 - 5.2 mmol/L 4.5  4.6    Chloride 96 - 106 mmol/L 102  104    CO2 20 - 29 mmol/L 21  24    Calcium 8.7 - 10.2 mg/dL 10.6  10.5  10.8   Total Protein 6.0 - 8.5 g/dL 6.8     Total Bilirubin 0.0 - 1.2 mg/dL 0.5     Alkaline Phos 44 - 121 IU/L 95     AST 0 - 40 IU/L 15     ALT 0 - 32 IU/L 16       Lipid Panel     Component Value Date/Time   CHOL 155 05/22/2017 1136   TRIG 124 05/22/2017 1136   HDL 47 05/22/2017 1136   CHOLHDL 3.3 05/22/2017 1136   CHOLHDL 8.0 (H) 10/28/2015 1104   VLDL 60 (H) 10/28/2015 1104   LDLCALC 83 05/22/2017 1136    CBC    Component Value Date/Time   WBC 8.5 12/16/2019 1057   WBC 9.3 12/18/2014 1853   RBC 5.16 12/16/2019 1057   RBC 5.18 (H) 12/18/2014 1853   HGB 14.7 12/16/2019 1057   HCT 43.8 12/16/2019 1057   PLT 225 12/16/2019 1057   MCV 85 12/16/2019 1057   MCH 28.5 12/16/2019 1057   MCH 29.7 12/18/2014 1853   MCHC 33.6 12/16/2019 1057   MCHC 35.6 12/18/2014 1853   RDW 13.1 12/16/2019 1057   LYMPHSABS 1.8 12/16/2019 1057   EOSABS 0.1 12/16/2019 1057   BASOSABS 0.1 12/16/2019 1057    Lab Results  Component Value Date   HGBA1C 5.7 (H) 10/08/2013    Assessment & Plan:  1. Bilateral sciatica Uncontrolled She complains of sensory loss in her right leg and falls My exam does not demonstrate weakness, reflexes are normal Advised to start taking her meloxicam tablets Advised to apply heat or ice whichever is tolerated to painful  areas. Counseled on evidence of improvement in pain control with regards to yoga, water aerobics, massage, home physical therapy, exercise as tolerated. She declines referral to physical therapy. - Ambulatory referral to Physical Medicine Rehab - MR Lumbar Spine Wo Contrast; Future  2. Essential hypertension Controlled Continue current antihypertensive Counseled on blood pressure goal of less than 130/80, low-sodium, DASH diet, medication compliance, 150 minutes of moderate intensity exercise per week. Discussed medication compliance, adverse effects.    No orders of the defined types were placed in this encounter.   Follow-up: Return in about 3 months (around 04/26/2022) for Chronic medical conditions.       Charlott Rakes, MD, FAAFP. [facility] [place], [place]   01/25/2022, 5:08 PM

## 2022-01-25 NOTE — Patient Instructions (Signed)
Sciatica  Sciatica is pain, numbness, weakness, or tingling along the path of the sciatic nerve. The sciatic nerve starts in the lower back and runs down the back of each leg. The nerve controls the muscles in the lower leg and in the back of the knee. It also provides feeling (sensation) to the back of the thigh, the lower leg, and the sole of the foot. Sciatica is a symptom of another medical condition that pinches or puts pressure on the sciatic nerve. Sciatica most often only affects one side of the body. Sciatica usually goes away on its own or with treatment. In some cases, sciatica may come back (recur). What are the causes? This condition is caused by pressure on the sciatic nerve or pinching of the nerve. This may be the result of: A disk in between the bones of the spine bulging out too far (herniated disk). Age-related changes in the spinal disks. A pain disorder that affects a muscle in the buttock. Extra bone growth near the sciatic nerve. A break (fracture) of the pelvis. Pregnancy. Tumor. This is rare. What increases the risk? The following factors may make you more likely to develop this condition: Playing sports that place pressure or stress on the spine. Having poor strength and flexibility. A history of back injury or surgery. Sitting for long periods of time. Doing activities that involve repetitive bending or lifting. Obesity. What are the signs or symptoms? Symptoms can vary from mild to very severe. They may include: Any of the following problems in the lower back, leg, hip, or buttock: Mild tingling, numbness, or dull aches. Burning sensations. Sharp pains. Numbness in the back of the calf or the sole of the foot. Leg weakness. Severe back pain that makes movement difficult. Symptoms may get worse when you cough, sneeze, or laugh, or when you sit or stand for long periods of time. How is this diagnosed? This condition may be diagnosed based on: Your symptoms  and medical history. A physical exam. Blood tests. Imaging tests, such as: X-rays. An MRI. A CT scan. How is this treated? In many cases, this condition improves on its own without treatment. However, treatment may include: Reducing or modifying physical activity. Exercising, including strengthening and stretching. Icing and applying heat to the affected area. Medicines that help to: Relieve pain and swelling. Relax your muscles. Injections of medicines that help to relieve pain and inflammation (steroids) around the sciatic nerve. Surgery. Follow these instructions at home: Medicines Take over-the-counter and prescription medicines only as told by your health care provider. Ask your health care provider if the medicine prescribed to you requires you to avoid driving or using heavy machinery. Managing pain     If directed, put ice on the affected area. To do this: Put ice in a plastic bag. Place a towel between your skin and the bag. Leave the ice on for 20 minutes, 2-3 times a day. If your skin turns bright red, remove the ice right away to prevent skin damage. The risk of skin damage is higher if you cannot feel pain, heat, or cold. If directed, apply heat to the affected area as often as told by your health care provider. Use the heat source that your health care provider recommends, such as a moist heat pack or a heating pad. Place a towel between your skin and the heat source. Leave the heat on for 20-30 minutes. If your skin turns bright red, remove the heat right away to prevent burns. The   risk of burns is higher if you cannot feel pain, heat, or cold. Activity  Return to your normal activities as told by your health care provider. Ask your health care provider what activities are safe for you. Avoid activities that make your symptoms worse. Take brief periods of rest throughout the day. When you rest for longer periods, mix in some mild activity or stretching between  periods of rest. This will help to prevent stiffness and pain. Avoid sitting for long periods of time without moving. Get up and move around at least one time each hour. Exercise and stretch regularly as told by your health care provider. Do not lift anything that is heavier than 10 lb (4.5 kg) until your health care provider says that it is safe. When you do not have symptoms, you should still avoid heavy lifting, especially repetitive heavy lifting. When you lift objects, always use proper lifting technique, which includes: Bending your knees. Keeping the load close to your body. Avoiding twisting. General instructions Maintain a healthy weight. Excess weight puts extra stress on your back. Wear supportive, comfortable shoes. Avoid wearing high heels. Avoid sleeping on a mattress that is too soft or too hard. A mattress that is firm enough to support your back when you sleep may help to reduce your pain. Contact a health care provider if: Your pain is not controlled by medicine. Your pain does not improve or gets worse. Your pain lasts longer than 4 weeks. You have unexplained weight loss. Get help right away if: You are not able to control when you urinate or have bowel movements (incontinence). You have: Weakness in your lower back, pelvis, buttocks, or legs that gets worse. Redness or swelling of your back. A burning sensation when you urinate. Summary Sciatica is pain, numbness, weakness, or tingling along the path of the sciatic nerve, which may include the lower back, legs, hips, and buttocks. This condition is caused by pressure on the sciatic nerve or pinching of the nerve. Treatment often includes rest, exercise, medicines, and applying ice or heat. This information is not intended to replace advice given to you by your health care provider. Make sure you discuss any questions you have with your health care provider. Document Revised: 05/10/2021 Document Reviewed:  05/10/2021 Elsevier Patient Education  2023 Elsevier Inc.  

## 2022-01-27 ENCOUNTER — Telehealth (INDEPENDENT_AMBULATORY_CARE_PROVIDER_SITE_OTHER): Payer: No Payment, Other | Admitting: Psychiatry

## 2022-01-27 ENCOUNTER — Encounter (HOSPITAL_COMMUNITY): Payer: Self-pay | Admitting: Psychiatry

## 2022-01-27 DIAGNOSIS — F331 Major depressive disorder, recurrent, moderate: Secondary | ICD-10-CM

## 2022-01-27 DIAGNOSIS — F411 Generalized anxiety disorder: Secondary | ICD-10-CM

## 2022-01-27 MED ORDER — SERTRALINE HCL 100 MG PO TABS: 150.0000 mg | ORAL_TABLET | Freq: Every day | ORAL | 3 refills | Status: DC

## 2022-01-27 MED ORDER — BUSPIRONE HCL 15 MG PO TABS: 15.0000 mg | ORAL_TABLET | Freq: Two times a day (BID) | ORAL | 3 refills | Status: DC

## 2022-01-27 MED ORDER — ARIPIPRAZOLE 2 MG PO TABS: 2.0000 mg | ORAL_TABLET | Freq: Every day | ORAL | 3 refills | Status: DC

## 2022-01-27 MED ORDER — GABAPENTIN 300 MG PO CAPS: ORAL_CAPSULE | ORAL | 3 refills | Status: DC

## 2022-01-27 NOTE — Progress Notes (Signed)
BH MD/PA/NP OP Progress Note Virtual Visit via Video Note  I connected with Stacy Barrett on 01/27/22 at  2:00 PM EST by a video enabled telemedicine application and verified that I am speaking with the correct person using two identifiers.  Location: Patient: Home Provider: Clinic   I discussed the limitations of evaluation and management by telemedicine and the availability of in person appointments. The patient expressed understanding and agreed to proceed.  I provided 30 minutes of non-face-to-face time during this encounter.    01/27/2022 11:51 AM Stacy Barrett  MRN:  [id number]  Chief Complaint: "I am trying to get toxic people out my life"  HPI: 56 year old female seen today for follow-up psychiatric evaluation.  She has a psychiatric history of anxiety, depression, panic disorder, and tobacco dependence.  She is currently managed on Abilify 2 mg daily, BuSpar 15 mg twice daily, gabapentin 300 mg 3 times daily, and Zoloft 150 mg daily.  She informed Probation officer that that her medications are effective in managing her psychiatric addition.  Today she was unable to login virtually so her assessment was done over the phone. During exam she was pleasant, cooperative, and engaged in conversation. She informed Probation officer that she is trying to get toxic people out of her life. She notes that she and her best friend are no longer speaking because she has been hateful towards her. She notes that now she feels that she has few outlets to relax as she was working at her friends [employer]. Patient reports that she continues to care for her elderly mother who suffers from dementia. At times she notes that this exacerbates her mental health but reports that she is learning to cope with doing things alone.   Despite the above stressors patient notes that her anxiety, depression, and mood has improved since her last visit. Today provider conducted a GAD 7 and patient scored a 10, at her last visit she  scored an 11. Provider also conducted a PHQ 9 and patient scored a 10, at her last visit she scored a 14. She endorses adequate sleep and appetite. Patient notes that she has gained 20 pounds. Today she endorses passive SI however denies wanting to harm herself.  She denies SI/HI/VAH, mania, or paranoia.  Patient continues to be in constant pain due to sciatica. She reports that mobic and gabapentin are somewhat effective in managing pain.   Patient notes that she is doing well overall. No medication changes made today. Patient agreeable to continue medications as prescribed.  No other concerns at this time.  Visit Diagnosis:    ICD-10-CM   1. Moderate episode of recurrent major depressive disorder (HCC)  F33.1 ARIPiprazole (ABILIFY) 2 MG tablet    busPIRone (BUSPAR) 15 MG tablet    sertraline (ZOLOFT) 100 MG tablet    gabapentin (NEURONTIN) 300 MG capsule    2. GAD (generalized anxiety disorder)  F41.1 busPIRone (BUSPAR) 15 MG tablet    sertraline (ZOLOFT) 100 MG tablet    gabapentin (NEURONTIN) 300 MG capsule      Past Psychiatric History: anxiety, depression, panic disorder, and tobacco dependence  Past Medical History:  Past Medical History:  Diagnosis Date   Anxiety    Depression    Edema    Hypertension    Lumbar radiculopathy    Pituitary adenoma ([place])    Vertebral fracture    No past surgical history on file.  Family Psychiatric History: Na  Family History:  Family History  Problem Relation  Age of Onset   CAD Mother     Social History:  Social History   Socioeconomic History   Marital status: Divorced    Spouse name: Not on file   Number of children: Not on file   Years of education: Not on file   Highest education level: Not on file  Occupational History   Not on file  Tobacco Use   Smoking status: Every Day    Packs/day: 0.50    Types: Cigarettes   Smokeless tobacco: Never  Vaping Use   Vaping Use: Former  Substance and Sexual Activity   Alcohol  use: Yes    Alcohol/week: 1.0 standard drink of alcohol    Types: 1 Glasses of wine per week    Comment: "hardly ever"   Drug use: Yes    Frequency: 2.0 times per week    Types: Marijuana   Sexual activity: Not on file  Other Topics Concern   Not on file  Social History Narrative   Not on file   Social Determinants of Health   Financial Resource Strain: Not on file  Food Insecurity: Not on file  Transportation Needs: Not on file  Physical Activity: Not on file  Stress: Not on file  Social Connections: Not on file    Allergies:  Allergies  Allergen Reactions   Other Other (See Comments)    MSG -Very intense headaches   Penicillins Hives   Prednisone Itching, Swelling and Rash    Swelling of lips and face   Sulfa Antibiotics Hives and Itching    Metabolic Disorder Labs: Lab Results  Component Value Date   HGBA1C 5.7 (H) 10/08/2013   MPG 117 (H) 10/08/2013   Lab Results  Component Value Date   PROLACTIN 37.5 (H) 10/28/2015   PROLACTIN 36.8 07/18/2014   Lab Results  Component Value Date   CHOL 155 05/22/2017   TRIG 124 05/22/2017   HDL 47 05/22/2017   CHOLHDL 3.3 05/22/2017   VLDL 60 (H) 10/28/2015   LDLCALC 83 05/22/2017   LDLCALC 86 06/14/2016   Lab Results  Component Value Date   TSH 1.270 12/16/2019   TSH 1.130 12/06/2017    Therapeutic Level Labs: Lab Results  Component Value Date   LITHIUM <0.25 (L) 10/03/2013   No results found for: "VALPROATE" No results found for: "CBMZ"  Current Medications: Current Outpatient Medications  Medication Sig Dispense Refill   ARIPiprazole (ABILIFY) 2 MG tablet Take 1 tablet (2 mg total) by mouth daily. 30 tablet 3   atorvastatin (LIPITOR) 20 MG tablet Take 1 tablet (20 mg total) by mouth daily. 30 tablet 6   busPIRone (BUSPAR) 15 MG tablet Take 1 tablet (15 mg total) by mouth 2 (two) times daily. 30 tablet 3   cetirizine (ZYRTEC) 10 MG tablet Take 1 tablet (10 mg total) by mouth daily. 90 tablet 3    fluticasone (FLONASE) 50 MCG/ACT nasal spray Place 2 sprays into both nostrils daily. 48 g 3   furosemide (LASIX) 20 MG tablet Take 1 tablet (20 mg total) by mouth daily as needed. 90 tablet 0   gabapentin (NEURONTIN) 300 MG capsule TAKE 1 CAPSULE BY MOUTH THREE TIMES A DAY 90 capsule 3   lisinopril (ZESTRIL) 10 MG tablet Take 1 tablet (10 mg total) by mouth daily. 30 tablet 6   meloxicam (MOBIC) 7.5 MG tablet Take 1 tablet (7.5 mg total) by mouth daily. 30 tablet 1   sertraline (ZOLOFT) 100 MG tablet Take 1.5 tablets (150  mg total) by mouth daily. 45 tablet 3   Vitamin D, Ergocalciferol, (DRISDOL) 1.25 MG (50000 UNIT) CAPS capsule Take 1 capsule by mouth every 7 (seven) days. 16 capsule 0   No current facility-administered medications for this visit.     Musculoskeletal: Strength & Muscle Tone:  Unable to assess due to telephone visit Gait & Station:  Unable to assess due to telephone visit Patient leans: N/A  Psychiatric Specialty Exam: Review of Systems  There were no vitals taken for this visit.There is no height or weight on file to calculate BMI.  General Appearance:  Unable to assess due to telephone visit  Eye Contact:   Unable to assess due to telephone visit  Speech:  Clear and Coherent and Normal Rate  Volume:  Normal  Mood:  Euthymic  Affect:  Appropriate, Congruent, and Tearful  Thought Process:  Coherent, Goal Directed, and Linear  Orientation:  Full (Time, Place, and Person)  Thought Content: WDL and Logical   Suicidal Thoughts:  Yes.  without intent/plan  Homicidal Thoughts:  No  Memory:  Immediate;   Good Recent;   Good Remote;   Good  Judgement:  Good  Insight:  Good  Psychomotor Activity:   Unable to assess due to telephone visit  Concentration:  Concentration: Good and Attention Span: Good  Recall:  Good  Fund of Knowledge: Good  Language: Good  Akathisia:   Unable to assess due to telephone visit  Handed:  Right  AIMS (if indicated): not done  Assets:   Communication Skills Desire for Improvement Financial Resources/Insurance Housing Physical Health  ADL's:  Intact  Cognition: WNL  Sleep:  Good   Screenings: AIMS    Flowsheet Row Video Visit from 04/20/2020 in [place] Total Score 0      AUDIT    [place] Admission (Discharged) from 10/06/2013 in [facility]  Alcohol Use Disorder Identification Test Final Score (AUDIT) 0      GAD-7    Flowsheet Row Video Visit from 01/27/2022 in [facility] Office Visit from 01/25/2022 in [place] Video Visit from 10/25/2021 in [facility] Office Visit from 10/11/2021 in [place] Office Visit from 08/02/2021 in [place]  Total GAD-7 Score '10 12 11 6 18      '$ PHQ2-9    Flowsheet Row Video Visit from 01/27/2022 in [facility] Office Visit from 01/25/2022 in [place] Video Visit from 10/25/2021 in [facility] Office Visit from 10/11/2021 in [place] Office Visit from 08/02/2021 in [place]  PHQ-2 Total Score '2 2 4 2 4  '$ PHQ-9 Total Score '10 6 14 6 19      '$ Flowsheet Row Video Visit from 01/27/2022 in [facility] Video Visit from 10/25/2021 in [facility] Video Visit from 07/22/2021 in [place] Error: Q7 should not be populated when Q6 is No Error: Q7 should not be populated when Q6 is No Error: Q7 should not be populated when Q6 is No        Assessment and Plan: Patient reports that she has several life stressors but is able to cope with it. Overall she is doing well. No medication changes made  today. Patient  agreeable to continue medications as prescribed.   1. Moderate episode of recurrent major depressive disorder (HCC)  Continue- ARIPiprazole (ABILIFY) 2 MG tablet; Take 1 tablet (2 mg total) by mouth daily.  Dispense: 30 tablet; Refill: 3 Increased- busPIRone (BUSPAR) 15 MG tablet; Take 1 tablet (15 mg total) by mouth 2 (two) times daily.  Dispense: 30 tablet; Refill: 3 Continue- gabapentin (NEURONTIN) 300 MG capsule; TAKE 1 CAPSULE BY MOUTH THREE TIMES A DAY  Dispense: 90 capsule; Refill: 3 Continue- sertraline (ZOLOFT) 100 MG tablet; Take 1.5 tablets (150 mg total) by mouth daily.  Dispense: 45 tablet; Refill: 3  2. GAD (generalized anxiety disorder)  Increased- busPIRone (BUSPAR) 15 MG tablet; Take 1 tablet (15 mg total) by mouth 2 (two) times daily.  Dispense: 30 tablet; Refill: 3 Continue- gabapentin (NEURONTIN) 300 MG capsule; TAKE 1 CAPSULE BY MOUTH THREE TIMES A DAY  Dispense: 90 capsule; Refill: 3 Continue- sertraline (ZOLOFT) 100 MG tablet; Take 1.5 tablets (150 mg total) by mouth daily.  Dispense: 45 tablet; Refill: 3   Follow-up in 3 months Salley Slaughter, NP 01/27/2022, 11:51 AM

## 2022-01-30 ENCOUNTER — Ambulatory Visit (HOSPITAL_COMMUNITY)
Admission: RE | Admit: 2022-01-30 | Discharge: 2022-01-30 | Disposition: A | Discharge status: Home / Self Care | Payer: Medicaid Other | Source: Ambulatory Visit | Attending: Family Medicine | Admitting: Family Medicine

## 2022-01-30 DIAGNOSIS — M5432 Sciatica, left side: Secondary | ICD-10-CM | POA: Insufficient documentation

## 2022-01-30 DIAGNOSIS — M5431 Sciatica, right side: Secondary | ICD-10-CM | POA: Insufficient documentation

## 2022-02-11 ENCOUNTER — Encounter: Payer: Self-pay | Admitting: Physical Medicine & Rehabilitation

## 2022-03-25 ENCOUNTER — Encounter: Payer: Self-pay | Attending: Physical Medicine & Rehabilitation | Admitting: Physical Medicine & Rehabilitation

## 2022-03-25 ENCOUNTER — Encounter: Payer: Self-pay | Admitting: Physical Medicine & Rehabilitation

## 2022-03-25 DIAGNOSIS — M4316 Spondylolisthesis, lumbar region: Secondary | ICD-10-CM

## 2022-03-25 NOTE — Progress Notes (Signed)
Subjective:    Patient ID: Stacy Barrett, female    DOB: 1965-06-18, 57 y.o.   MRN: [id number] Patient seen with medical resident PGY 3 Iona Beard MD HPI CC:  RIght buttocks pain mainly late  57 year old female with history of anxiety and depression as well as hypertension who was referred for chronic low back pain.  She has [place] stabbing pain posterior thighs and buttocks area.  Average pain is an 8-9 range.  Standing seems to increase her pain the most, walking is not quite as bad.  Bending also increases plain  Tingling in Right  ant and lateral thigh although this is better than it was a month or 2 ago. Some clumsiness of hips and legs   Tried NSAIDs but causes stomach upset  Gabapentin helps a little     MRI LUMBAR SPINE WITHOUT CONTRAST   TECHNIQUE: Multiplanar, multisequence MR imaging of the lumbar spine was performed. No intravenous contrast was administered.   COMPARISON:  None Available.   FINDINGS: Segmentation: 5 lumbar type vertebral bodies. The lowest fully formed disc space is labeled L5-S1.   Alignment: Mild dextrocurvature. 2 mm retrolisthesis of L2 on L3, L3 on L4, and L4 on L5. 4 mm anterolisthesis L5 on S1 (grade 1).   Vertebrae: No acute fracture, suspicious osseous lesion, or evidence of discitis.   Conus medullaris and cauda equina: Conus extends to the L1 level. Conus and cauda equina appear normal.   Paraspinal and other soft tissues: Left renal cysts, for which no follow-up is currently indicated. No lymphadenopathy.   Disc levels:   T11-T12: Minimal disc bulge and small right foraminal protrusion. No spinal canal stenosis or neural foraminal narrowing.   T12-L1: No significant disc bulge. No spinal canal stenosis or neural foraminal narrowing.   L1-L2: Minimal disc bulge with right foraminal protrusion. Narrowing of the right lateral recess. No spinal canal stenosis or neural foraminal narrowing.   L2-L3: Trace retrolisthesis  with mild disc bulge with superimposed left subarticular disc extrusion with 9 mm of caudal migration. Narrowing of the left lateral recess. Mild facet arthropathy. No spinal canal stenosis or neural foraminal narrowing.   L3-L4: Trace retrolisthesis and minimal disc bulge, eccentric to the right, which may contact the exiting right L3 nerve. No spinal canal stenosis or neural foraminal narrowing.   L4-L5: Trace retrolisthesis with mild disc bulge, with superimposed left paracentral disc extrusion with 4 mm of caudal migration. No spinal canal stenosis or neural foraminal narrowing.   L5-S1: Grade 1 anterolisthesis with disc unroofing and mild disc bulge. No spinal canal stenosis. Mild left neural foraminal narrowing.   IMPRESSION: 1. L3-L4 right eccentric disc bulge may contact the exiting right L3 nerve. 2. L5-S1 mild left neural foraminal narrowing. 3. Narrowing of the right lateral recess at L1-L2 and left lateral recess at L2-L3 could affect the descending right L2 and left L3 nerves, respectively.     Electronically Signed   By: Merilyn Baba M.D.   On: 02/01/2022 01:30   Pain Inventory Average Pain 9 Pain Right Now 6 My pain is constant, burning, dull, stabbing, and aching  In the last 24 hours, has pain interfered with the following? General activity 7 Relation with others 7 Enjoyment of life 9 What TIME of day is your pain at its worst? morning  and evening Sleep (in general) Good  Pain is worse with: walking, bending, sitting, and standing Pain improves with: rest and heat/ice Relief from Meds: 5  walk with  assistance how many minutes can you walk? unknown ability to climb steps?  yes Do you have any goals in this area?  yes  I need assistance with the following:  household duties Do you have any goals in this area?  yes  bladder control problems weakness depression anxiety  CT/MRI In [facility]  Any changes since last visit?   no    Family History  Problem Relation Age of Onset   CAD Mother    Social History   Socioeconomic History   Marital status: Divorced    Spouse name: Not on file   Number of children: Not on file   Years of education: Not on file   Highest education level: Not on file  Occupational History   Not on file  Tobacco Use   Smoking status: Every Day    Packs/day: 0.50    Types: Cigarettes   Smokeless tobacco: Never  Vaping Use   Vaping Use: Former  Substance and Sexual Activity   Alcohol use: Yes    Alcohol/week: 1.0 standard drink of alcohol    Types: 1 Glasses of wine per week    Comment: "hardly ever"   Drug use: Yes    Frequency: 2.0 times per week    Types: Marijuana   Sexual activity: Not on file  Other Topics Concern   Not on file  Social History Narrative   Not on file   Social Determinants of Health   Financial Resource Strain: Not on file  Food Insecurity: Not on file  Transportation Needs: Not on file  Physical Activity: Not on file  Stress: Not on file  Social Connections: Not on file   No past surgical history on file. Past Medical History:  Diagnosis Date   Anxiety    Depression    Edema    Hypertension    Lumbar radiculopathy    Pituitary adenoma ([place])    Vertebral fracture    There were no vitals taken for this visit.  Opioid Risk Score:   Fall Risk Score:  `1  Depression screen [facility] 2/9     01/27/2022   11:34 AM 01/25/2022    4:24 PM 10/25/2021    2:51 PM 10/11/2021   10:35 AM 08/02/2021    2:42 PM 07/22/2021    1:47 PM 04/20/2020    2:02 PM  Depression screen PHQ 2/9  Decreased Interest  1  1 2    $ Down, Depressed, Hopeless  1  1 2    $ PHQ - 2 Score  2  2 4    $ Altered sleeping  0  0 3    Tired, decreased energy  1  1 3    $ Change in appetite  0  0 2    Feeling bad or failure about yourself   1  1 3    $ Trouble concentrating  1  1 2    $ Moving slowly or fidgety/restless  0  0 0    Suicidal thoughts  1  1 2    $ PHQ-9 Score  6  6 19     $ Difficult doing work/chores            Information is confidential and restricted. Go to Review Flowsheets to unlock data.      Review of Systems  Constitutional:        Weight gain  Respiratory:  Positive for cough.   Genitourinary:  Positive for frequency.       Stress incontinence  Musculoskeletal:  Positive for back pain.  Skin:  Positive for rash.  Neurological:  Positive for weakness.  Psychiatric/Behavioral:         Depression, anxiety  All other systems reviewed and are negative.      Objective:   Physical Exam  Obese female no acute distress Mood and affect are appropriate although mildly labile toward the end of the visit. Motor strength is 5/5 bilateral hip flexor knee extensor ankle dorsiflexor Negative straight leg raise bilaterally There is pain in the hamstring with forward bending bilaterally. There is also limited lumbar extension noted back bending. Sidebending did not cause any pain nor did twisting. Ambulates without assistive device no evidence of toe drag or knee Negative SI provocative maneuvers, negative thigh thrust negative distraction test negative FABERs, positive prone compression      Assessment & Plan:   #1.  Chronic low back pain as discussed with patient this is a combination of facet arthropathy as well as degenerative disc.  She has radicular symptoms which would be consistent with L2 and/or L3 nerve root compression but these are fairly intermittent and becoming milder. We discussed that she has other issues as well including bilateral hamstring tightness We discussed the importance of improving fitness level as well as losing weight.  She does not feel like she can lose weight without exercising more vigorously. Will make referral to physical therapy Will see her back in 4 weeks if not much better consider lumbar medial branch blocks bilaterally L3-L4-L5.  If radicular pain becomes more pronounced consider epidural right L2-3 L3-4

## 2022-03-25 NOTE — Patient Instructions (Signed)
Back Exercises These exercises help to make your trunk and back strong. They also help to keep the lower back flexible. Doing these exercises can help to prevent or lessen pain in your lower back. If you have back pain, try to do these exercises 2-3 times each day or as told by your doctor. As you get better, do the exercises once each day. Repeat the exercises more often as told by your doctor. To stop back pain from coming back, do the exercises once each day, or as told by your doctor. Do exercises exactly as told by your doctor. Stop right away if you feel sudden pain or your pain gets worse. Exercises Single knee to chest Do these steps 3-5 times in a row for each leg: Lie on your back on a firm bed or the floor with your legs stretched out. Bring one knee to your chest. Grab your knee or thigh with both hands and hold it in place. Pull on your knee until you feel a gentle stretch in your lower back or butt. Keep doing the stretch for 10-30 seconds. Slowly let go of your leg and straighten it. Pelvic tilt Do these steps 5-10 times in a row: Lie on your back on a firm bed or the floor with your legs stretched out. Bend your knees so they point up to the ceiling. Your feet should be flat on the floor. Tighten your lower belly (abdomen) muscles to press your lower back against the floor. This will make your tailbone point up to the ceiling instead of pointing down to your feet or the floor. Stay in this position for 5-10 seconds while you gently tighten your muscles and breathe evenly. Cat-cow Do these steps until your lower back bends more easily: Get on your hands and knees on a firm bed or the floor. Keep your hands under your shoulders, and keep your knees under your hips. You may put padding under your knees. Let your head hang down toward your chest. Tighten (contract) the muscles in your belly. Point your tailbone toward the floor so your lower back becomes rounded like the back of a  cat. Stay in this position for 5 seconds. Slowly lift your head. Let the muscles of your belly relax. Point your tailbone up toward the ceiling so your back forms a sagging arch like the back of a cow. Stay in this position for 5 seconds.  Press-ups Do these steps 5-10 times in a row: Lie on your belly (face-down) on a firm bed or the floor. Place your hands near your head, about shoulder-width apart. While you keep your back relaxed and keep your hips on the floor, slowly straighten your arms to raise the top half of your body and lift your shoulders. Do not use your back muscles. You may change where you place your hands to make yourself more comfortable. Stay in this position for 5 seconds. Keep your back relaxed. Slowly return to lying flat on the floor.  Bridges Do these steps 10 times in a row: Lie on your back on a firm bed or the floor. Bend your knees so they point up to the ceiling. Your feet should be flat on the floor. Your arms should be flat at your sides, next to your body. Tighten your butt muscles and lift your butt off the floor until your waist is almost as high as your knees. If you do not feel the muscles working in your butt and the back of  your thighs, slide your feet 1-2 inches (2.5-5 cm) farther away from your butt. Stay in this position for 3-5 seconds. Slowly lower your butt to the floor, and let your butt muscles relax. If this exercise is too easy, try doing it with your arms crossed over your chest. Belly crunches Do these steps 5-10 times in a row: Lie on your back on a firm bed or the floor with your legs stretched out. Bend your knees so they point up to the ceiling. Your feet should be flat on the floor. Cross your arms over your chest. Tip your chin a little bit toward your chest, but do not bend your neck. Tighten your belly muscles and slowly raise your chest just enough to lift your shoulder blades a tiny bit off the floor. Avoid raising your body  higher than that because it can put too much stress on your lower back. Slowly lower your chest and your head to the floor. Back lifts Do these steps 5-10 times in a row: Lie on your belly (face-down) with your arms at your sides, and rest your forehead on the floor. Tighten the muscles in your legs and your butt. Slowly lift your chest off the floor while you keep your hips on the floor. Keep the back of your head in line with the curve in your back. Look at the floor while you do this. Stay in this position for 3-5 seconds. Slowly lower your chest and your face to the floor. Contact a doctor if: Your back pain gets a lot worse when you do an exercise. Your back pain does not get better within 2 hours after you exercise. If you have any of these problems, stop doing the exercises. Do not do them again unless your doctor says it is okay. Get help right away if: You have sudden, very bad back pain. If this happens, stop doing the exercises. Do not do them again unless your doctor says it is okay. This information is not intended to replace advice given to you by your health care provider. Make sure you discuss any questions you have with your health care provider. Document Revised: 04/15/2020 Document Reviewed: 04/15/2020 Elsevier Patient Education  [place].

## 2022-04-05 ENCOUNTER — Ambulatory Visit: Payer: Medicaid Other | Admitting: Family Medicine

## 2022-04-11 ENCOUNTER — Other Ambulatory Visit: Payer: Self-pay

## 2022-04-11 ENCOUNTER — Ambulatory Visit: Payer: Medicaid Other | Attending: Physical Medicine & Rehabilitation | Admitting: Physical Therapy

## 2022-04-11 ENCOUNTER — Encounter: Payer: Self-pay | Admitting: Physical Therapy

## 2022-04-11 DIAGNOSIS — M25552 Pain in left hip: Secondary | ICD-10-CM | POA: Insufficient documentation

## 2022-04-11 DIAGNOSIS — R2689 Other abnormalities of gait and mobility: Secondary | ICD-10-CM | POA: Insufficient documentation

## 2022-04-11 DIAGNOSIS — M5459 Other low back pain: Secondary | ICD-10-CM | POA: Insufficient documentation

## 2022-04-11 DIAGNOSIS — M4316 Spondylolisthesis, lumbar region: Secondary | ICD-10-CM | POA: Insufficient documentation

## 2022-04-11 DIAGNOSIS — M5431 Sciatica, right side: Secondary | ICD-10-CM | POA: Insufficient documentation

## 2022-04-11 DIAGNOSIS — M25551 Pain in right hip: Secondary | ICD-10-CM | POA: Insufficient documentation

## 2022-04-11 DIAGNOSIS — M6281 Muscle weakness (generalized): Secondary | ICD-10-CM | POA: Insufficient documentation

## 2022-04-11 NOTE — Therapy (Signed)
OUTPATIENT PHYSICAL THERAPY THORACOLUMBAR EVALUATION   Patient Name: Stacy Barrett MRN: [id number] DOB:Jan 19, 1966, 57 y.o., female Today's Date: 04/11/2022  END OF SESSION:  PT End of Session - 04/11/22 1506     Visit Number 1    Number of Visits 16    Date for PT Re-Evaluation 06/06/22    Authorization Type Medicaid    PT Start Time 1506    PT Stop Time 1545    PT Time Calculation (min) 39 min    Activity Tolerance Patient tolerated treatment well             Past Medical History:  Diagnosis Date   Anxiety    Depression    Edema    Hypertension    Lumbar radiculopathy    Pituitary adenoma ([place])    Vertebral fracture    History reviewed. No pertinent surgical history. Patient Active Problem List   Diagnosis Date Noted   MDD (major depressive disorder), recurrent episode, moderate ([place]) 01/27/2021   GAD (generalized anxiety disorder) 01/27/2021   IBS (irritable bowel syndrome) 05/19/2017   Tobacco abuse 05/19/2017   Low back pain 06/13/2016   Hyperlipidemia 11/02/2015   Panic disorder with agoraphobia 10/08/2013   Panic attacks 10/07/2013   Major depression, recurrent ([place]) 10/07/2013   Anxiety 10/06/2013   Depression 10/06/2013   Vitamin D insufficiency 06/26/2013   Hypertension 06/22/2013   IUD (intrauterine device) in place 06/22/2013   Cervical cancer screening 06/22/2013    PCP: Charlott Rakes  REFERRING PROVIDER: Charlett Blake, MD  REFERRING DIAG: M43.16 (ICD-10-CM) - Spondylolisthesis, lumbar region  Rationale for Evaluation and Treatment: Rehabilitation  THERAPY DIAG:  Muscle weakness (generalized)  Other low back pain  Sciatica, right side  Other abnormalities of gait and mobility  Pain in left hip  Pain in right hip  ONSET DATE: ~15 years ago  SUBJECTIVE:                                                                                                                                                                                            SUBJECTIVE STATEMENT: Pt goes out of town once a week to help a friend -- helped with yard work and is hurting today. 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 Pt reports leg weakness and pain. Pt states pain of bending forward has been getting worse. Notes it hurts to do dishes and laundry. By end of day pain is like a hornet's sting in R buttock, left one starts up to. Pt notes she is in a  cycle of gaining weight. Pain is best in the morning 3/10. At most 8/10 by the end of day (around 5pm). States she has to stretch to keep pain at bay during the day.   PERTINENT HISTORY:  Chronic back pain  PAIN:  Are you having pain? Yes: NPRS scale: 8/10 Pain location: Bilat buttocks (R worse than L) Pain description: Throbbing, dull, achy Aggravating factors: Bending forward Relieving factors: Pain medication, stretching  PRECAUTIONS: Fall  WEIGHT BEARING RESTRICTIONS: No  FALLS:  Has patient fallen in last 6 months? Yes. 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 L3-L4 right eccentric disc bulge may contact the exiting right L3 nerve. 2. L5-S1 mild left neural foraminal narrowing. 3. Narrowing of the right lateral recess at L1-L2 and left lateral recess at L2-L3 could affect the descending right L2 and left L3 nerves, respectively.  PATIENT SURVEYS:  FOTO 72; predicted 44  SCREENING FOR RED  FLAGS: Bowel or bladder incontinence: No Spinal tumors: No Cauda equina syndrome: No Compression fracture: No Abdominal aneurysm: No  COGNITION: Overall cognitive status: Within functional limits for tasks assessed     SENSATION: WFL  MUSCLE LENGTH: Hamstrings: Right 65 deg; Left 75 deg Thomas test: Did not assess  POSTURE: decreased lumbar lordosis  PALPATION: TTP bilat glutes and piriformis (worse R than L)  LUMBAR ROM:   AROM eval  Flexion 60%  Extension 25%*  Right lateral flexion 50%*  Left lateral flexion 60%*  Right rotation 25%  Left rotation 25%   (Blank rows = not tested) * = pain  LOWER EXTREMITY ROM:     Active  Right eval Left eval  Hip flexion    Hip extension    Hip abduction    Hip adduction    Hip internal rotation    Hip external rotation    Knee flexion    Knee extension    Ankle dorsiflexion    Ankle plantarflexion    Ankle inversion    Ankle eversion     (Blank rows = not tested)  LOWER EXTREMITY MMT:    MMT Right eval Left eval  Hip flexion 4- 4+  Hip extension 3 3  Hip abduction 3 4  Hip adduction    Hip internal rotation    Hip external rotation    Knee flexion 4- 4+  Knee extension 4+ 4+  Ankle dorsiflexion 4 4  Ankle plantarflexion 4 4  Ankle inversion    Ankle eversion     (Blank rows = not tested)  LUMBAR SPECIAL TESTS:  Slump test: Positive on R pulls up/down spine SLR: Positive on R  FUNCTIONAL TESTS:  5 times sit to stand: 10.03 sec L SLS: 22.39 sec; R SLS: 11.96 sec (with trendelenburg)   GAIT: Distance walked: 100' Assistive device utilized: None Level of assistance: Complete Independence Comments: Mildly antalgic  TODAY'S TREATMENT:  DATE: 04/11/22    PATIENT EDUCATION:  Education details: Exam findings, POC, aquatics, initial HEP Person educated: Patient Education  method: Explanation, Demonstration, and Handouts Education comprehension: verbalized understanding, returned demonstration, and needs further education  HOME EXERCISE PROGRAM: Access Code: [email or web address] URL: [email or web address] Date: 04/11/2022 Prepared by: Estill Bamberg April Thurnell Garbe  Exercises - Seated Figure 4 Piriformis Stretch  - 1 x daily - 7 x weekly - 2 sets - 30 sec hold - Seated Piriformis Stretch  - 1 x daily - 7 x weekly - 2 sets - 30 sec hold - Standing 'L' Stretch at Counter  - 1 x daily - 7 x weekly - 2 sets - 30 sec hold  ASSESSMENT:  CLINICAL IMPRESSION: Patient is a 57 y.o. F who was seen today for physical therapy evaluation and treatment for back/posterior hip pain with radicular symptoms. Assessment significant for core, bilat glute/piriformis weakness and tension likely affecting R sciatic nerve. Pt demos decreased R and L LE stability with SLS. 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   OBJECTIVE IMPAIRMENTS: Abnormal gait, decreased balance, decreased endurance, decreased mobility, difficulty walking, decreased strength, increased fascial restrictions, increased muscle spasms, impaired flexibility, impaired UE functional use, improper body mechanics, postural dysfunction, and pain.   ACTIVITY LIMITATIONS: carrying, lifting, bending, standing, sleeping, stairs, transfers, bed mobility, toileting, dressing, hygiene/grooming, locomotion level, and caring for others  PARTICIPATION LIMITATIONS: meal prep, cleaning, laundry, shopping, community activity, occupation, and yard work  PERSONAL FACTORS: Fitness, Past/current experiences, and Time since onset of injury/illness/exacerbation are also affecting patient's functional outcome.   REHAB POTENTIAL: Good  CLINICAL DECISION MAKING: Evolving/moderate complexity  EVALUATION COMPLEXITY:  Moderate   GOALS: Goals reviewed with patient? Yes  SHORT TERM GOALS: Target date: 05/09/2022   Pt will be ind with initial HEP Baseline: Goal status: INITIAL  2.  Pt will demo at least 10 deg improvement with hamstring length in SLR for decreased R LE neural tension Baseline:  Goal status: INITIAL  3.  Pt will be ind with maintaining proper lifting mechanics for work and home tasks Baseline:  Goal status: INITIAL    LONG TERM GOALS: Target date: 06/06/2022   Pt will be ind with management and progression of HEP Baseline:  Goal status: INITIAL  2.  Pt will maintain SLS for at least 37 sec R&L to be within age norms to demo improved LE and core stability Baseline:  Goal status: INITIAL  3.  Pt will report reduced pain by >/=50% Baseline:  Goal status: INITIAL  4.  Pt will have 10% improvement in lumbar ROM with </=2/10 pain Baseline:  Goal status: INITIAL  5.  Pt will have improved FOTO to >/=54 Baseline:  Goal status: INITIAL  PLAN:  PT FREQUENCY: 2x/week  PT DURATION: 8 weeks  PLANNED INTERVENTIONS: Therapeutic exercises, Therapeutic activity, Neuromuscular re-education, Balance training, Gait training, Patient/Family education, Self Care, Joint mobilization, Stair training, Aquatic Therapy, Dry Needling, Electrical stimulation, Spinal manipulation, Spinal mobilization, Cryotherapy, Moist heat, scar mobilization, Taping, Traction, Ionotophoresis '4mg'$ /ml Dexamethasone, Manual therapy, and Re-evaluation.  PLAN FOR NEXT SESSION: Assess response to HEP. Initiate core and hip strengthening.   Ashara Lounsbury April Ma L Edilia Ghuman, PT 04/11/2022, 4:53 PM

## 2022-04-15 ENCOUNTER — Other Ambulatory Visit (HOSPITAL_COMMUNITY): Payer: Self-pay | Admitting: Psychiatry

## 2022-04-15 ENCOUNTER — Other Ambulatory Visit: Payer: Self-pay | Admitting: Family Medicine

## 2022-04-15 DIAGNOSIS — E7849 Other hyperlipidemia: Secondary | ICD-10-CM

## 2022-04-15 DIAGNOSIS — F411 Generalized anxiety disorder: Secondary | ICD-10-CM

## 2022-04-15 DIAGNOSIS — F331 Major depressive disorder, recurrent, moderate: Secondary | ICD-10-CM

## 2022-04-20 ENCOUNTER — Encounter (HOSPITAL_COMMUNITY): Payer: Self-pay

## 2022-04-20 ENCOUNTER — Telehealth (HOSPITAL_COMMUNITY): Payer: No Payment, Other | Admitting: Psychiatry

## 2022-04-20 ENCOUNTER — Ambulatory Visit: Payer: Medicaid Other | Admitting: Physical Therapy

## 2022-04-20 ENCOUNTER — Other Ambulatory Visit (HOSPITAL_COMMUNITY): Payer: Self-pay | Admitting: Psychiatry

## 2022-04-20 DIAGNOSIS — F411 Generalized anxiety disorder: Secondary | ICD-10-CM

## 2022-04-20 DIAGNOSIS — J3089 Other allergic rhinitis: Secondary | ICD-10-CM

## 2022-04-20 DIAGNOSIS — F331 Major depressive disorder, recurrent, moderate: Secondary | ICD-10-CM

## 2022-04-20 MED ORDER — GABAPENTIN 300 MG PO CAPS: ORAL_CAPSULE | ORAL | 3 refills | Status: DC

## 2022-04-20 MED ORDER — ARIPIPRAZOLE 2 MG PO TABS: 2.0000 mg | ORAL_TABLET | Freq: Every day | ORAL | 3 refills | Status: DC

## 2022-04-20 MED ORDER — SERTRALINE HCL 100 MG PO TABS: 150.0000 mg | ORAL_TABLET | Freq: Every day | ORAL | 3 refills | Status: DC

## 2022-04-20 MED ORDER — BUSPIRONE HCL 15 MG PO TABS: 15.0000 mg | ORAL_TABLET | Freq: Two times a day (BID) | ORAL | 3 refills | Status: DC

## 2022-04-20 NOTE — Telephone Encounter (Signed)
57 year old female seen today for follow-up psychiatric evaluation.  She has a psychiatric history of anxiety, depression, panic disorder, and tobacco dependence.  She is currently managed on Abilify 2 mg daily, BuSpar 15 mg twice daily, gabapentin 300 mg 3 times daily, and Zoloft 150 mg daily.  She informed Probation officer that that her medications are effective in managing her psychiatric addition.   Today patient was unable to login virtually.  Provider informed patient that a telephone visit could not be conducted.  She notes that she feels mentally stable however notes that she is undergoing caregiver burden.  Patient notes that her mother who has dementia is mean and demeaning.  Patient notes that she never gets time alone and is unable to afford a caregiver. She also reports that she is unable to afford to live on her own. Patient tearful while explaining the above. Today she denies SI/HI/VAH, mania, or paranoia.  Despite the above stressors patient reports that her mental health is well-managed.  No medication changes made today.  Patient agreeable to continue medications as prescribed.

## 2022-04-21 ENCOUNTER — Ambulatory Visit: Payer: Medicaid Other | Admitting: Physical Therapy

## 2022-04-25 ENCOUNTER — Encounter: Payer: Self-pay | Admitting: Physical Therapy

## 2022-04-25 ENCOUNTER — Ambulatory Visit: Payer: Medicaid Other | Attending: Physical Medicine & Rehabilitation | Admitting: Physical Therapy

## 2022-04-25 DIAGNOSIS — M25551 Pain in right hip: Secondary | ICD-10-CM | POA: Insufficient documentation

## 2022-04-25 DIAGNOSIS — M25552 Pain in left hip: Secondary | ICD-10-CM | POA: Diagnosis present

## 2022-04-25 DIAGNOSIS — M6281 Muscle weakness (generalized): Secondary | ICD-10-CM | POA: Diagnosis present

## 2022-04-25 DIAGNOSIS — M5459 Other low back pain: Secondary | ICD-10-CM | POA: Diagnosis present

## 2022-04-25 DIAGNOSIS — R2689 Other abnormalities of gait and mobility: Secondary | ICD-10-CM

## 2022-04-25 DIAGNOSIS — M5431 Sciatica, right side: Secondary | ICD-10-CM | POA: Diagnosis present

## 2022-04-25 NOTE — Therapy (Signed)
OUTPATIENT PHYSICAL THERAPY TREATMENT   Patient Name: Stacy Barrett MRN: [id number] DOB:1965/12/27, 57 y.o., female Today's Date: 04/25/2022  END OF SESSION:  PT End of Session - 04/25/22 1425     Visit Number 2    Number of Visits 16    Date for PT Re-Evaluation 06/06/22    Authorization Type Medicaid    PT Start Time 1425   late arrival   PT Stop Time 1500    PT Time Calculation (min) 35 min    Activity Tolerance Patient tolerated treatment well              Past Medical History:  Diagnosis Date   Anxiety    Depression    Edema    Hypertension    Lumbar radiculopathy    Pituitary adenoma ([place])    Vertebral fracture    History reviewed. No pertinent surgical history. Patient Active Problem List   Diagnosis Date Noted   MDD (major depressive disorder), recurrent episode, moderate ([place]) 01/27/2021   GAD (generalized anxiety disorder) 01/27/2021   IBS (irritable bowel syndrome) 05/19/2017   Tobacco abuse 05/19/2017   Low back pain 06/13/2016   Hyperlipidemia 11/02/2015   Panic disorder with agoraphobia 10/08/2013   Panic attacks 10/07/2013   Major depression, recurrent ([place]) 10/07/2013   Anxiety 10/06/2013   Depression 10/06/2013   Vitamin D insufficiency 06/26/2013   Hypertension 06/22/2013   IUD (intrauterine device) in place 06/22/2013   Cervical cancer screening 06/22/2013    PCP: Charlott Rakes  REFERRING PROVIDER: Charlett Blake, MD  REFERRING DIAG: M43.16 (ICD-10-CM) - Spondylolisthesis, lumbar region  Rationale for Evaluation and Treatment: Rehabilitation  THERAPY DIAG:  Muscle weakness (generalized)  Other low back pain  Sciatica, right side  Other abnormalities of gait and mobility  Pain in left hip  Pain in right hip  ONSET DATE: ~15 years ago  SUBJECTIVE:                                                                                                                                                                                            SUBJECTIVE STATEMENT: Pt states she's not in as much pain today. Pt states she has been stretching daily.   PERTINENT HISTORY:  Chronic back pain From eval: Pt goes out of town once a week to help a friend -- helped with yard work and is hurting today. 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 Pt reports leg weakness and pain. Pt states pain of bending forward has been getting worse. Notes it hurts  to do dishes and laundry. By end of day pain is like a hornet's sting in R buttock, left one starts up to. Pt notes she is in a cycle of gaining weight. Pain is best in the morning 3/10. At most 8/10 by the end of day (around 5pm). States she has to stretch to keep pain at bay during the day.   PAIN:  Are you having pain? Yes: NPRS scale: 8/10 Pain location: Bilat buttocks (R worse than L) Pain description: Throbbing, dull, achy Aggravating factors: Bending forward Relieving factors: Pain medication, stretching  PRECAUTIONS: Fall  WEIGHT BEARING RESTRICTIONS: No  FALLS:  Has patient fallen in last 6 months? Yes. 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 L3-L4 right eccentric disc bulge may contact the exiting right L3 nerve. 2. L5-S1 mild left neural foraminal narrowing. 3. Narrowing of the right lateral recess at L1-L2 and left lateral recess at L2-L3 could affect the descending right L2  and left L3 nerves, respectively.  PATIENT SURVEYS:  FOTO 26; predicted 46  SCREENING FOR RED FLAGS: Bowel or bladder incontinence: No Spinal tumors: No Cauda equina syndrome: No Compression fracture: No Abdominal aneurysm: No  COGNITION: Overall cognitive status: Within functional limits for tasks assessed     SENSATION: WFL  MUSCLE LENGTH: Hamstrings: Right 65 deg; Left 75 deg Thomas test: Did not assess  POSTURE: decreased lumbar lordosis  PALPATION: TTP bilat glutes and piriformis (worse R than L)  LUMBAR ROM:   AROM eval  Flexion 60%  Extension 25%*  Right lateral flexion 50%*  Left lateral flexion 60%*  Right rotation 25%  Left rotation 25%   (Blank rows = not tested) * = pain  LOWER EXTREMITY ROM:     Active  Right eval Left eval  Hip flexion    Hip extension    Hip abduction    Hip adduction    Hip internal rotation    Hip external rotation    Knee flexion    Knee extension    Ankle dorsiflexion    Ankle plantarflexion    Ankle inversion    Ankle eversion     (Blank rows = not tested)  LOWER EXTREMITY MMT:    MMT Right eval Left eval  Hip flexion 4- 4+  Hip extension 3 3  Hip abduction 3 4  Hip adduction    Hip internal rotation    Hip external rotation    Knee flexion 4- 4+  Knee extension 4+ 4+  Ankle dorsiflexion 4 4  Ankle plantarflexion 4 4  Ankle inversion    Ankle eversion     (Blank rows = not tested)  LUMBAR SPECIAL TESTS:  Slump test: Positive on R pulls up/down spine SLR: Positive on R  FUNCTIONAL TESTS:  5 times sit to stand: 10.03 sec L SLS: 22.39 sec; R SLS: 11.96 sec (with trendelenburg)   GAIT: Distance walked: 100' Assistive device utilized: None Level of assistance: Complete Independence Comments: Mildly antalgic  TODAY'S TREATMENT:  [facility] Adult PT Treatment:                                                DATE: 04/25/22 Therapeutic Exercise: Seated figure 4 stretch x 30 sec Seated piriformis  stretch x 30 sec Supine PPT 10x5 sec PPT + marching x10 Bridge 2x10 S/L clamshell 2x10 R&L Supine hamstring stretch with strap x30 sec Prone hip ext 2x10 Self Care: Self massage and TPR on R QL and lumbar paraspinals                                                                                                                             DATE: 04/11/22  See HEP  PATIENT EDUCATION:  Education details: Exam findings, POC, aquatics, initial HEP Person educated: Patient Education method: Explanation, Demonstration, and Handouts Education comprehension: verbalized understanding, returned demonstration, and needs further education  HOME EXERCISE PROGRAM: Access Code: [email or web address] URL: [email or web address] Date: 04/11/2022 Prepared by: Estill Bamberg April Thurnell Garbe  Exercises - Seated Figure 4 Piriformis Stretch  - 1 x daily - 7 x weekly - 2 sets - 30 sec hold - Seated Piriformis Stretch  - 1 x daily - 7 x weekly - 2 sets - 30 sec hold - Standing 'L' Stretch at Counter  - 1 x daily - 7 x weekly - 2 sets - 30 sec hold  ASSESSMENT:  CLINICAL IMPRESSION: Pt comes in today with decreased pain and improved hip extensibility. Able to initiate strengthening exercises for her core and hips with good tolerance. Pt tolerated session very well.   OBJECTIVE IMPAIRMENTS: Abnormal gait, decreased balance, decreased endurance, decreased mobility, difficulty walking, decreased strength, increased fascial restrictions, increased muscle spasms, impaired flexibility, impaired UE functional use, improper body mechanics, postural dysfunction, and pain.   ACTIVITY LIMITATIONS: carrying, lifting, bending, standing, sleeping, stairs, transfers, bed mobility, toileting, dressing, hygiene/grooming, locomotion level, and caring for others  PARTICIPATION LIMITATIONS: meal prep, cleaning, laundry, shopping, community activity, occupation, and yard work  PERSONAL FACTORS: Fitness, Past/current  experiences, and Time since onset of injury/illness/exacerbation are also affecting patient's functional outcome.   REHAB POTENTIAL: Good  CLINICAL DECISION MAKING: Evolving/moderate complexity  EVALUATION COMPLEXITY: Moderate   GOALS: Goals reviewed with patient? Yes  SHORT TERM GOALS: Target date: 05/09/2022   Pt will be ind with initial HEP Baseline: Goal status: INITIAL  2.  Pt will demo at least 10 deg improvement with hamstring length in SLR for decreased R LE neural tension Baseline:  Goal status: INITIAL  3.  Pt will be ind with maintaining proper lifting mechanics for work and home tasks Baseline:  Goal status: INITIAL    LONG TERM GOALS: Target date: 06/06/2022   Pt will be ind with management and progression of HEP Baseline:  Goal status: INITIAL  2.  Pt will maintain SLS for at  least 37 sec R&L to be within age norms to demo improved LE and core stability Baseline:  Goal status: INITIAL  3.  Pt will report reduced pain by >/=50% Baseline:  Goal status: INITIAL  4.  Pt will have 10% improvement in lumbar ROM with </=2/10 pain Baseline:  Goal status: INITIAL  5.  Pt will have improved FOTO to >/=54 Baseline:  Goal status: INITIAL  PLAN:  PT FREQUENCY: 2x/week  PT DURATION: 8 weeks  PLANNED INTERVENTIONS: Therapeutic exercises, Therapeutic activity, Neuromuscular re-education, Balance training, Gait training, Patient/Family education, Self Care, Joint mobilization, Stair training, Aquatic Therapy, Dry Needling, Electrical stimulation, Spinal manipulation, Spinal mobilization, Cryotherapy, Moist heat, scar mobilization, Taping, Traction, Ionotophoresis '4mg'$ /ml Dexamethasone, Manual therapy, and Re-evaluation.  PLAN FOR NEXT SESSION: Assess response to HEP. Initiate core and hip strengthening.   Kaijah Abts April Ma L Cosimo Schertzer, PT 04/25/2022, 2:25 PM

## 2022-04-26 ENCOUNTER — Ambulatory Visit: Payer: Medicaid Other | Admitting: Family Medicine

## 2022-04-28 ENCOUNTER — Encounter: Payer: Self-pay | Admitting: Physical Medicine & Rehabilitation

## 2022-04-28 ENCOUNTER — Encounter: Payer: Medicaid Other | Attending: Physical Medicine & Rehabilitation | Admitting: Physical Medicine & Rehabilitation

## 2022-04-28 VITALS — BP 164/93 | HR 83 | Ht 65.0 in | Wt 203.0 lb

## 2022-04-28 DIAGNOSIS — M5416 Radiculopathy, lumbar region: Secondary | ICD-10-CM | POA: Insufficient documentation

## 2022-04-28 MED ORDER — TRAMADOL HCL 50 MG PO TABS: 50.0000 mg | ORAL_TABLET | Freq: Every day | ORAL | 0 refills | Status: DC

## 2022-04-28 NOTE — Progress Notes (Signed)
Subjective:    Patient ID: Stacy Barrett, female    DOB: December 29, 1965, 57 y.o.   MRN: [id number]  HPI Chief complaint is right thigh discomfort and weakness. 57 year old female who was seen in clinic approximately 1 month ago for same complaints.  She has been going through outpatient physical therapy which has been helpful overall in improving positional pain.  She continues have pain mainly with bending forward.  She still feels some weakness at the right thigh particularly when she is trying to flex her hip. She has had no new falls or trauma.  She has no persistent numbness or tingling in the lower extremities.  She has had no loss of bowel or bladder function.  Reviewed recent lumbar MRI dated 02/05/2022 report as well as images CLINICAL DATA:  Low back pain, radiating down right leg   EXAM: MRI LUMBAR SPINE WITHOUT CONTRAST   TECHNIQUE: Multiplanar, multisequence MR imaging of the lumbar spine was performed. No intravenous contrast was administered.   COMPARISON:  None Available.   FINDINGS: Segmentation: 5 lumbar type vertebral bodies. The lowest fully formed disc space is labeled L5-S1.   Alignment: Mild dextrocurvature. 2 mm retrolisthesis of L2 on L3, L3 on L4, and L4 on L5. 4 mm anterolisthesis L5 on S1 (grade 1).   Vertebrae: No acute fracture, suspicious osseous lesion, or evidence of discitis.   Conus medullaris and cauda equina: Conus extends to the L1 level. Conus and cauda equina appear normal.   Paraspinal and other soft tissues: Left renal cysts, for which no follow-up is currently indicated. No lymphadenopathy.   Disc levels:   T11-T12: Minimal disc bulge and small right foraminal protrusion. No spinal canal stenosis or neural foraminal narrowing.   T12-L1: No significant disc bulge. No spinal canal stenosis or neural foraminal narrowing.   L1-L2: Minimal disc bulge with right foraminal protrusion. Narrowing of the right lateral recess. No spinal  canal stenosis or neural foraminal narrowing.   L2-L3: Trace retrolisthesis with mild disc bulge with superimposed left subarticular disc extrusion with 9 mm of caudal migration. Narrowing of the left lateral recess. Mild facet arthropathy. No spinal canal stenosis or neural foraminal narrowing.   L3-L4: Trace retrolisthesis and minimal disc bulge, eccentric to the right, which may contact the exiting right L3 nerve. No spinal canal stenosis or neural foraminal narrowing.   L4-L5: Trace retrolisthesis with mild disc bulge, with superimposed left paracentral disc extrusion with 4 mm of caudal migration. No spinal canal stenosis or neural foraminal narrowing.   L5-S1: Grade 1 anterolisthesis with disc unroofing and mild disc bulge. No spinal canal stenosis. Mild left neural foraminal narrowing.   IMPRESSION: 1. L3-L4 right eccentric disc bulge may contact the exiting right L3 nerve. 2. L5-S1 mild left neural foraminal narrowing. 3. Narrowing of the right lateral recess at L1-L2 and left lateral recess at L2-L3 could affect the descending right L2 and left L3 nerves, respectively.     Electronically Signed   By: Merilyn Baba M.D.   On: 02/01/2022 01:30   Pain Inventory Average Pain 6 Pain Right Now 5 My pain is sharp, dull, and aching  In the last 24 hours, has pain interfered with the following? General activity 8 Relation with others 8 Enjoyment of life 9 What TIME of day is your pain at its worst? night Sleep (in general) Fair  Pain is worse with: walking and bending Pain improves with: medication, tylenol, gabapentin Relief from Meds: 3  Family History  Problem  Relation Age of Onset   CAD Mother    Social History   Socioeconomic History   Marital status: Divorced    Spouse name: Not on file   Number of children: Not on file   Years of education: Not on file   Highest education level: Not on file  Occupational History   Not on file  Tobacco Use    Smoking status: Every Day    Packs/day: .5    Types: Cigarettes   Smokeless tobacco: Never  Vaping Use   Vaping Use: Former   Substances: THC, CBD  Substance and Sexual Activity   Alcohol use: Yes    Alcohol/week: 1.0 standard drink of alcohol    Types: 1 Glasses of wine per week    Comment: "hardly ever"   Drug use: Yes    Frequency: 2.0 times per week    Types: Marijuana   Sexual activity: Not on file  Other Topics Concern   Not on file  Social History Narrative   Not on file   Social Determinants of Health   Financial Resource Strain: Not on file  Food Insecurity: Not on file  Transportation Needs: Not on file  Physical Activity: Not on file  Stress: Not on file  Social Connections: Not on file   History reviewed. No pertinent surgical history. History reviewed. No pertinent surgical history. Past Medical History:  Diagnosis Date   Anxiety    Depression    Edema    Hypertension    Lumbar radiculopathy    Pituitary adenoma ([facility])    Vertebral fracture    BP (!) 164/93   Pulse 83   Ht '5\' 5"'$  (1.651 m)   Wt 203 lb (92.1 kg)   SpO2 95%   BMI 33.78 kg/m   Opioid Risk Score:   Fall Risk Score:  `1  Depression screen [facility] 2/9     04/28/2022    2:31 PM 03/25/2022    2:20 PM 01/27/2022   11:34 AM 01/25/2022    4:24 PM 10/25/2021    2:51 PM 10/11/2021   10:35 AM 08/02/2021    2:42 PM  Depression screen PHQ 2/9  Decreased Interest '1 1  1  1 2  '$ Down, Depressed, Hopeless '1 1  1  1 2  '$ PHQ - 2 Score '2 2  2  2 4  '$ Altered sleeping  0  0  0 3  Tired, decreased energy  '2  1  1 3  '$ Change in appetite  0  0  0 2  Feeling bad or failure about yourself   '1  1  1 3  '$ Trouble concentrating  '1  1  1 2  '$ Moving slowly or fidgety/restless  0  0  0 0  Suicidal thoughts  '1  1  1 2  '$ PHQ-9 Score  '7  6  6 19  '$ Difficult doing work/chores            Information is confidential and restricted. Go to Review Flowsheets to unlock data.     Review of Systems  Musculoskeletal:   Positive for back pain.  All other systems reviewed and are negative.     Objective:   Physical Exam  Negative straight leg raise bilaterally Motor strength is 3 - at the right hip flexor 5 at the knee extensor 5 at the ankle dorsiflexor 5/5 in the left hip flexor knee extensor ankle dorsiflexor Patient reports equal sensation light touch bilateral L2-L3-L4 did distribution. Ambulates without  assistive device no evidence of toe drag or knee instability.       Assessment & Plan:   1.  Chronic low back pain as discussed with patient this is a combination of facet arthropathy as well as degenerative disc.  She has radicular symptoms which would be consistent with L2 and/or L3 nerve root compression but these are fairly intermittent and becoming milder. We discussed that she has other issues as well including bilateral hamstring tightness We discussed the importance of improving fitness level as well as losing weight.  She does not feel like she can lose weight without exercising more vigorously. Will make referral to physical therapy Well back pain has overall improved, still has right thigh discomfort as well as some hip flexor weakness.  Plan transforaminal fluoroscopy guided contrast-enhanced epidural right L2-3 L3-4

## 2022-04-28 NOTE — Patient Instructions (Signed)
Continue PT exercises

## 2022-05-02 ENCOUNTER — Ambulatory Visit: Payer: Medicaid Other | Admitting: Physical Therapy

## 2022-05-02 ENCOUNTER — Encounter: Payer: Self-pay | Admitting: Physical Therapy

## 2022-05-02 DIAGNOSIS — M25551 Pain in right hip: Secondary | ICD-10-CM

## 2022-05-02 DIAGNOSIS — M25552 Pain in left hip: Secondary | ICD-10-CM

## 2022-05-02 DIAGNOSIS — M6281 Muscle weakness (generalized): Secondary | ICD-10-CM

## 2022-05-02 DIAGNOSIS — R2689 Other abnormalities of gait and mobility: Secondary | ICD-10-CM

## 2022-05-02 DIAGNOSIS — M5431 Sciatica, right side: Secondary | ICD-10-CM

## 2022-05-02 DIAGNOSIS — M5459 Other low back pain: Secondary | ICD-10-CM

## 2022-05-02 NOTE — Therapy (Signed)
OUTPATIENT PHYSICAL THERAPY TREATMENT   Patient Name: Stacy Barrett MRN: [id number] DOB:August 05, 1965, 57 y.o., female Today's Date: 05/02/2022  END OF SESSION:  PT End of Session - 05/02/22 1455     Visit Number 3    Number of Visits 16    Date for PT Re-Evaluation 06/06/22    Authorization Type Medicaid    PT Start Time 1500    PT Stop Time 1540    PT Time Calculation (min) 40 min    Activity Tolerance Patient tolerated treatment well              Past Medical History:  Diagnosis Date   Anxiety    Depression    Edema    Hypertension    Lumbar radiculopathy    Pituitary adenoma ([place])    Vertebral fracture    History reviewed. No pertinent surgical history. Patient Active Problem List   Diagnosis Date Noted   MDD (major depressive disorder), recurrent episode, moderate ([place]) 01/27/2021   GAD (generalized anxiety disorder) 01/27/2021   IBS (irritable bowel syndrome) 05/19/2017   Tobacco abuse 05/19/2017   Low back pain 06/13/2016   Hyperlipidemia 11/02/2015   Panic disorder with agoraphobia 10/08/2013   Panic attacks 10/07/2013   Major depression, recurrent ([place]) 10/07/2013   Anxiety 10/06/2013   Depression 10/06/2013   Vitamin D insufficiency 06/26/2013   Hypertension 06/22/2013   IUD (intrauterine device) in place 06/22/2013   Cervical cancer screening 06/22/2013    PCP: Charlott Rakes  REFERRING PROVIDER: Charlett Blake, MD  REFERRING DIAG: M43.16 (ICD-10-CM) - Spondylolisthesis, lumbar region  Rationale for Evaluation and Treatment: Rehabilitation  THERAPY DIAG:  Muscle weakness (generalized)  Other low back pain  Sciatica, right side  Other abnormalities of gait and mobility  Pain in left hip  Pain in right hip  ONSET DATE: ~15 years ago  SUBJECTIVE:                                                                                                                                                                                            SUBJECTIVE STATEMENT: Pt states all week her midback was exacerbated. Pt states as it dissipated she has been able to do them. "Pain in my legs is not as excruciating." Pt states she is due for an epidural in 2 weeks.   PERTINENT HISTORY:  Chronic back pain From eval: Pt goes out of town once a week to help a friend -- helped with yard work and is hurting today. 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 Pt reports leg weakness and pain. Pt states pain of bending forward has been getting worse. Notes it hurts to do dishes and laundry. By end of day pain is like a hornet's sting in R buttock, left one starts up to. Pt notes she is in a cycle of gaining weight. Pain is best in the morning 3/10. At most 8/10 by the end of day (around 5pm). States she has to stretch to keep pain at bay during the day.   PAIN:  Are you having pain? Yes: NPRS scale: 8/10 Pain location: Bilat buttocks (R worse than L) Pain description: Throbbing, dull, achy Aggravating factors: Bending forward Relieving factors: Pain medication, stretching  PRECAUTIONS: Fall  WEIGHT BEARING RESTRICTIONS: No  FALLS:  Has patient fallen in last 6 months? Yes. 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 L3-L4 right eccentric disc bulge may contact the exiting right L3 nerve. 2. L5-S1 mild left neural foraminal narrowing. 3. Narrowing of the right  lateral recess at L1-L2 and left lateral recess at L2-L3 could affect the descending right L2 and left L3 nerves, respectively.  PATIENT SURVEYS:  FOTO 44; predicted 54  MUSCLE LENGTH: Hamstrings: Right 65 deg; Left 75 deg Thomas test: Did not assess  POSTURE: decreased lumbar lordosis  PALPATION: TTP bilat glutes and piriformis (worse R than L)  LUMBAR ROM:   AROM eval  Flexion 60%  Extension 25%*  Right lateral flexion 50%*  Left lateral flexion 60%*  Right rotation 25%  Left rotation 25%   (Blank rows = not tested) * = pain  LOWER EXTREMITY ROM:     Active  Right eval Left eval  Hip flexion    Hip extension    Hip abduction    Hip adduction    Hip internal rotation    Hip external rotation    Knee flexion    Knee extension    Ankle dorsiflexion    Ankle plantarflexion    Ankle inversion    Ankle eversion     (Blank rows = not tested)  LOWER EXTREMITY MMT:    MMT Right eval Left eval  Hip flexion 4- 4+  Hip extension 3 3  Hip abduction 3 4  Hip adduction    Hip internal rotation    Hip external rotation    Knee flexion 4- 4+  Knee extension 4+ 4+  Ankle dorsiflexion 4 4  Ankle plantarflexion 4 4  Ankle inversion    Ankle eversion     (Blank rows = not tested)  LUMBAR SPECIAL TESTS:  Slump test: Positive on R pulls up/down spine SLR: Positive on R  FUNCTIONAL TESTS:  5 times sit to stand: 10.03 sec L SLS: 22.39 sec; R SLS: 11.96 sec (with trendelenburg)   GAIT: Distance walked: 100' Assistive device utilized: None Level of assistance: Complete Independence Comments: Mildly antalgic  TODAY'S TREATMENT:    [facility] Adult PT Treatment:  DATE: 05/02/22 Therapeutic Exercise: Nustep L4 x 5 min Seated figure 4 stretch x 30 sec Supine LTR 2x30 sec Supine PPT 10x 5 sec Supine PPT with marching 2x10 Supine bridge 2x10 Standing hip abd + ext red TB 2x10 Standing side stepping red TB 2x10 Palloff  press red TB doubled 2x10x5 sec Row red TB 2x10 Seated hamstring stretch 2x30 sec   [facility] Adult PT Treatment:                                                DATE: 04/25/22 Therapeutic Exercise: Seated figure 4 stretch x 30 sec Seated piriformis stretch x 30 sec Supine PPT 10x5 sec PPT + marching x10 Bridge 2x10 S/L clamshell 2x10 R&L Supine hamstring stretch with strap x30 sec Prone hip ext 2x10 Self Care: Self massage and TPR on R QL and lumbar paraspinals                                                                                                                             DATE: 04/11/22  See HEP  PATIENT EDUCATION:  Education details: Exam findings, POC, aquatics, initial HEP Person educated: Patient Education method: Explanation, Demonstration, and Handouts Education comprehension: verbalized understanding, returned demonstration, and needs further education  HOME EXERCISE PROGRAM: Access Code: [email or web address] URL: [email or web address] Date: 05/02/2022 Prepared by: Estill Bamberg April Thurnell Garbe  Exercises - Seated Figure 4 Piriformis Stretch  - 1 x daily - 7 x weekly - 2 sets - 30 sec hold - Seated Piriformis Stretch  - 1 x daily - 7 x weekly - 2 sets - 30 sec hold - Standing 'L' Stretch at Counter  - 1 x daily - 7 x weekly - 2 sets - 30 sec hold - Clamshell  - 1 x daily - 7 x weekly - 2 sets - 10 reps - Supine Posterior Pelvic Tilt  - 1 x daily - 7 x weekly - 2 sets - 10 reps - Supine March with Posterior Pelvic Tilt  - 1 x daily - 7 x weekly - 2 sets - 10 reps - Supine Bridge  - 1 x daily - 7 x weekly - 2 sets - 10 reps - Standing Anti-Rotation Press with Anchored Resistance  - 1 x daily - 7 x weekly - 2 sets - 10 reps - 5 sec hold - Standing Bilateral Low Shoulder Row with Anchored Resistance  - 1 x daily - 7 x weekly - 2 sets - 10 reps  ASSESSMENT:  CLINICAL IMPRESSION: Pt notes continued good response to her stretches. Pain is slowly decreasing. Continued  to work on strengthening mid back, core and hips.   OBJECTIVE IMPAIRMENTS: Abnormal gait, decreased balance, decreased endurance, decreased mobility, difficulty walking, decreased strength, increased fascial restrictions, increased muscle spasms, impaired flexibility, impaired UE functional use, improper body mechanics,  postural dysfunction, and pain.   ACTIVITY LIMITATIONS: carrying, lifting, bending, standing, sleeping, stairs, transfers, bed mobility, toileting, dressing, hygiene/grooming, locomotion level, and caring for others  PARTICIPATION LIMITATIONS: meal prep, cleaning, laundry, shopping, community activity, occupation, and yard work  PERSONAL FACTORS: Fitness, Past/current experiences, and Time since onset of injury/illness/exacerbation are also affecting patient's functional outcome.   REHAB POTENTIAL: Good  CLINICAL DECISION MAKING: Evolving/moderate complexity  EVALUATION COMPLEXITY: Moderate   GOALS: Goals reviewed with patient? Yes  SHORT TERM GOALS: Target date: 05/09/2022   Pt will be ind with initial HEP Baseline: Goal status: INITIAL  2.  Pt will demo at least 10 deg improvement with hamstring length in SLR for decreased R LE neural tension Baseline:  Goal status: INITIAL  3.  Pt will be ind with maintaining proper lifting mechanics for work and home tasks Baseline:  Goal status: INITIAL    LONG TERM GOALS: Target date: 06/06/2022   Pt will be ind with management and progression of HEP Baseline:  Goal status: INITIAL  2.  Pt will maintain SLS for at least 37 sec R&L to be within age norms to demo improved LE and core stability Baseline:  Goal status: INITIAL  3.  Pt will report reduced pain by >/=50% Baseline:  Goal status: INITIAL  4.  Pt will have 10% improvement in lumbar ROM with </=2/10 pain Baseline:  Goal status: INITIAL  5.  Pt will have improved FOTO to >/=54 Baseline:  Goal status: INITIAL  PLAN:  PT FREQUENCY: 2x/week  PT  DURATION: 8 weeks  PLANNED INTERVENTIONS: Therapeutic exercises, Therapeutic activity, Neuromuscular re-education, Balance training, Gait training, Patient/Family education, Self Care, Joint mobilization, Stair training, Aquatic Therapy, Dry Needling, Electrical stimulation, Spinal manipulation, Spinal mobilization, Cryotherapy, Moist heat, scar mobilization, Taping, Traction, Ionotophoresis 4mg /ml Dexamethasone, Manual therapy, and Re-evaluation.  PLAN FOR NEXT SESSION: Assess response to HEP. Initiate core and hip strengthening.   Dawid Dupriest April Ma L Almeter Westhoff, PT 05/02/2022, 2:55 PM

## 2022-05-05 ENCOUNTER — Ambulatory Visit: Payer: Medicaid Other | Admitting: Physical Therapy

## 2022-05-05 ENCOUNTER — Encounter: Payer: Self-pay | Admitting: Physical Therapy

## 2022-05-05 DIAGNOSIS — M5431 Sciatica, right side: Secondary | ICD-10-CM

## 2022-05-05 DIAGNOSIS — M6281 Muscle weakness (generalized): Secondary | ICD-10-CM

## 2022-05-05 DIAGNOSIS — M5459 Other low back pain: Secondary | ICD-10-CM

## 2022-05-05 NOTE — Therapy (Signed)
OUTPATIENT PHYSICAL THERAPY TREATMENT   Patient Name: Stacy Barrett MRN: [id number] DOB:06/13/1965, 57 y.o., female Today's Date: 05/06/2022  END OF SESSION:  PT End of Session - 05/05/22 1619     Visit Number 4    Number of Visits 16    Date for PT Re-Evaluation 06/06/22    Authorization Type Medicaid    PT Start Time 0420    PT Stop Time 0500    PT Time Calculation (min) 40 min    Activity Tolerance Patient tolerated treatment well               Past Medical History:  Diagnosis Date   Anxiety    Depression    Edema    Hypertension    Lumbar radiculopathy    Pituitary adenoma ([place])    Vertebral fracture    History reviewed. No pertinent surgical history. Patient Active Problem List   Diagnosis Date Noted   MDD (major depressive disorder), recurrent episode, moderate ([place]) 01/27/2021   GAD (generalized anxiety disorder) 01/27/2021   IBS (irritable bowel syndrome) 05/19/2017   Tobacco abuse 05/19/2017   Low back pain 06/13/2016   Hyperlipidemia 11/02/2015   Panic disorder with agoraphobia 10/08/2013   Panic attacks 10/07/2013   Major depression, recurrent ([place]) 10/07/2013   Anxiety 10/06/2013   Depression 10/06/2013   Vitamin D insufficiency 06/26/2013   Hypertension 06/22/2013   IUD (intrauterine device) in place 06/22/2013   Cervical cancer screening 06/22/2013    PCP: Charlott Rakes  REFERRING PROVIDER: Charlett Blake, MD  REFERRING DIAG: M43.16 (ICD-10-CM) - Spondylolisthesis, lumbar region  Rationale for Evaluation and Treatment: Rehabilitation  THERAPY DIAG:  Muscle weakness (generalized)  Other low back pain  Sciatica, right side  ONSET DATE: ~15 years ago  SUBJECTIVE:                                                                                                                                                                                           SUBJECTIVE STATEMENT: Pt states that she is feeling much better since  starting PT and rates her current pain at 3/10.  PERTINENT HISTORY:  Chronic back pain From eval: Pt goes out of town once a week to help a friend -- helped with yard work and is hurting today. 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 Pt reports leg weakness and pain. Pt states pain of bending forward has been getting worse. Notes it hurts to do dishes and laundry. By end of day pain is like a hornet's sting in R buttock,  left one starts up to. Pt notes she is in a cycle of gaining weight. Pain is best in the morning 3/10. At most 8/10 by the end of day (around 5pm). States she has to stretch to keep pain at bay during the day.   PAIN:  Are you having pain? Yes: NPRS scale: 3/10 Pain location: Bilat buttocks (R worse than L) Pain description: Throbbing, dull, achy Aggravating factors: Bending forward Relieving factors: Pain medication, stretching  PRECAUTIONS: Fall  WEIGHT BEARING RESTRICTIONS: No  FALLS:  Has patient fallen in last 6 months? Yes. 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 L3-L4 right eccentric disc bulge may contact the exiting right L3 nerve. 2. L5-S1 mild left neural foraminal narrowing. 3. Narrowing of the right lateral recess at L1-L2 and left lateral recess at L2-L3 could affect the descending right L2 and left L3 nerves, respectively.  PATIENT SURVEYS:  FOTO 44; predicted  54  MUSCLE LENGTH: Hamstrings: Right 65 deg; Left 75 deg Thomas test: Did not assess  POSTURE: decreased lumbar lordosis  PALPATION: TTP bilat glutes and piriformis (worse R than L)  LUMBAR ROM:   AROM eval  Flexion 60%  Extension 25%*  Right lateral flexion 50%*  Left lateral flexion 60%*  Right rotation 25%  Left rotation 25%   (Blank rows = not tested) * = pain  LOWER EXTREMITY ROM:     Active  Right eval Left eval  Hip flexion    Hip extension    Hip abduction    Hip adduction    Hip internal rotation    Hip external rotation    Knee flexion    Knee extension    Ankle dorsiflexion    Ankle plantarflexion    Ankle inversion    Ankle eversion     (Blank rows = not tested)  LOWER EXTREMITY MMT:    MMT Right eval Left eval  Hip flexion 4- 4+  Hip extension 3 3  Hip abduction 3 4  Hip adduction    Hip internal rotation    Hip external rotation    Knee flexion 4- 4+  Knee extension 4+ 4+  Ankle dorsiflexion 4 4  Ankle plantarflexion 4 4  Ankle inversion    Ankle eversion     (Blank rows = not tested)  LUMBAR SPECIAL TESTS:  Slump test: Positive on R pulls up/down spine SLR: Positive on R  FUNCTIONAL TESTS:  5 times sit to stand: 10.03 sec L SLS: 22.39 sec; R SLS: 11.96 sec (with trendelenburg)   GAIT: Distance walked: 100' Assistive device utilized: None Level of assistance: Complete Independence Comments: Mildly antalgic  TODAY'S TREATMENT:    TREATMENT 05/05/22:  Aquatic therapy at [address] - therapeutic pool temp 92 degrees Pt enters building independently.  Treatment took place in water 3.8 to  4 ft 8 in.feet deep depending upon activity.  Pt entered and exited the pool via stair and handrails    Aquatic Therapy:  Water walking for  warm up fwd/lat/bkwds  Standing: Standing hip abd 20x Standing hip ext 20x Kickboard push/pull Gentle pain free rotation with kickboard Noodle stomp Lateral walking with band  at toes Monster walking fwd and bkwd band at toes Standing row with abdominal contraction  Sit to stand 10x Step up 2x10 ea  Pt requires the buoyancy of water for active assisted exercises with buoyancy supported for strengthening and AROM exercises. Hydrostatic pressure also supports joints by unweighting joint load by at least 50 % in 3-4 feet depth water. 80% in chest to neck deep water. Water will provide assistance with movement using the current and laminar flow while the buoyancy reduces weight bearing. Pt requires the viscosity of the water for resistance with strengthening exercises.   [facility] Adult PT Treatment:                                                DATE: 05/02/22 Therapeutic Exercise: Nustep L4 x 5 min Seated figure 4 stretch x 30 sec Supine LTR 2x30 sec Supine PPT 10x 5 sec Supine PPT with marching 2x10 Supine bridge 2x10 Standing hip abd + ext red TB 2x10 Standing side stepping red TB 2x10 Palloff press red TB doubled 2x10x5 sec Row red TB 2x10 Seated hamstring stretch 2x30 sec   [facility] Adult PT Treatment:                                                DATE: 04/25/22 Therapeutic Exercise: Seated figure 4 stretch x 30 sec Seated piriformis stretch x 30 sec Supine PPT 10x5 sec PPT + marching x10 Bridge 2x10 S/L clamshell 2x10 R&L Supine hamstring stretch with strap x30 sec Prone hip ext 2x10 Self Care: Self massage and TPR on R QL and lumbar paraspinals                                                                                                                             DATE: 04/11/22  See HEP  PATIENT EDUCATION:  Education details: Exam findings, POC, aquatics, initial HEP Person educated: Patient Education method: Explanation, Demonstration, and Handouts Education comprehension: verbalized understanding, returned demonstration, and needs further education  HOME EXERCISE PROGRAM: Access Code: [email or web address] URL: [email or web address] Date:  05/02/2022 Prepared by: Estill Bamberg April Thurnell Garbe  Exercises - Seated Figure 4 Piriformis Stretch  - 1 x daily - 7 x weekly - 2 sets - 30 sec hold - Seated Piriformis Stretch  - 1 x daily - 7 x weekly - 2 sets - 30 sec hold - Standing 'L' Stretch at Counter  - 1 x daily - 7 x weekly - 2 sets - 30 sec hold - Clamshell  - 1 x  daily - 7 x weekly - 2 sets - 10 reps - Supine Posterior Pelvic Tilt  - 1 x daily - 7 x weekly - 2 sets - 10 reps - Supine March with Posterior Pelvic Tilt  - 1 x daily - 7 x weekly - 2 sets - 10 reps - Supine Bridge  - 1 x daily - 7 x weekly - 2 sets - 10 reps - Standing Anti-Rotation Press with Anchored Resistance  - 1 x daily - 7 x weekly - 2 sets - 10 reps - 5 sec hold - Standing Bilateral Low Shoulder Row with Anchored Resistance  - 1 x daily - 7 x weekly - 2 sets - 10 reps  ASSESSMENT:  CLINICAL IMPRESSION: Session today focused on core and hip strengthening in the aquatic environment for use of buoyancy to offload joints and the viscosity of water as resistance during therapeutic exercise.  Pt with relatively low levels of pain today.  Able to complete all exercises with min cuing and moderate fatigue.  Patient was able to tolerate all prescribed exercises in the aquatic environment with no adverse effects and reports 2/10 pain at the end of the session. Patient continues to benefit from skilled PT services on land and aquatic based and should be progressed as able to improve functional independence.   OBJECTIVE IMPAIRMENTS: Abnormal gait, decreased balance, decreased endurance, decreased mobility, difficulty walking, decreased strength, increased fascial restrictions, increased muscle spasms, impaired flexibility, impaired UE functional use, improper body mechanics, postural dysfunction, and pain.   ACTIVITY LIMITATIONS: carrying, lifting, bending, standing, sleeping, stairs, transfers, bed mobility, toileting, dressing, hygiene/grooming, locomotion level, and caring  for others  PARTICIPATION LIMITATIONS: meal prep, cleaning, laundry, shopping, community activity, occupation, and yard work  PERSONAL FACTORS: Fitness, Past/current experiences, and Time since onset of injury/illness/exacerbation are also affecting patient's functional outcome.   REHAB POTENTIAL: Good  CLINICAL DECISION MAKING: Evolving/moderate complexity  EVALUATION COMPLEXITY: Moderate   GOALS: Goals reviewed with patient? Yes  SHORT TERM GOALS: Target date: 05/09/2022   Pt will be ind with initial HEP Baseline: Goal status: INITIAL  2.  Pt will demo at least 10 deg improvement with hamstring length in SLR for decreased R LE neural tension Baseline:  Goal status: INITIAL  3.  Pt will be ind with maintaining proper lifting mechanics for work and home tasks Baseline:  Goal status: INITIAL    LONG TERM GOALS: Target date: 06/06/2022   Pt will be ind with management and progression of HEP Baseline:  Goal status: INITIAL  2.  Pt will maintain SLS for at least 37 sec R&L to be within age norms to demo improved LE and core stability Baseline:  Goal status: INITIAL  3.  Pt will report reduced pain by >/=50% Baseline:  Goal status: INITIAL  4.  Pt will have 10% improvement in lumbar ROM with </=2/10 pain Baseline:  Goal status: INITIAL  5.  Pt will have improved FOTO to >/=54 Baseline:  Goal status: INITIAL  PLAN:  PT FREQUENCY: 2x/week  PT DURATION: 8 weeks  PLANNED INTERVENTIONS: Therapeutic exercises, Therapeutic activity, Neuromuscular re-education, Balance training, Gait training, Patient/Family education, Self Care, Joint mobilization, Stair training, Aquatic Therapy, Dry Needling, Electrical stimulation, Spinal manipulation, Spinal mobilization, Cryotherapy, Moist heat, scar mobilization, Taping, Traction, Ionotophoresis 4mg /ml Dexamethasone, Manual therapy, and Re-evaluation.  PLAN FOR NEXT SESSION: Assess response to HEP. Initiate core and hip  strengthening.   Mathis Dad, PT 05/06/2022, 6:57 AM

## 2022-05-09 ENCOUNTER — Encounter: Payer: Self-pay | Admitting: Physical Therapy

## 2022-05-09 ENCOUNTER — Ambulatory Visit: Payer: Medicaid Other | Admitting: Physical Therapy

## 2022-05-09 DIAGNOSIS — R2689 Other abnormalities of gait and mobility: Secondary | ICD-10-CM

## 2022-05-09 DIAGNOSIS — M5459 Other low back pain: Secondary | ICD-10-CM

## 2022-05-09 DIAGNOSIS — M6281 Muscle weakness (generalized): Secondary | ICD-10-CM | POA: Diagnosis not present

## 2022-05-09 DIAGNOSIS — M5431 Sciatica, right side: Secondary | ICD-10-CM

## 2022-05-09 NOTE — Therapy (Signed)
OUTPATIENT PHYSICAL THERAPY TREATMENT   Patient Name: Stacy Barrett MRN: [id number] DOB:Oct 25, 1965, 57 y.o., female Today's Date: 05/09/2022  END OF SESSION:  PT End of Session - 05/09/22 1428     Visit Number 5    Number of Visits 16    Date for PT Re-Evaluation 06/06/22    Authorization Type Medicaid    PT Start Time 1428   late arrival   PT Stop Time 1500    PT Time Calculation (min) 32 min    Activity Tolerance Patient tolerated treatment well               Past Medical History:  Diagnosis Date   Anxiety    Depression    Edema    Hypertension    Lumbar radiculopathy    Pituitary adenoma ([place])    Vertebral fracture    History reviewed. No pertinent surgical history. Patient Active Problem List   Diagnosis Date Noted   MDD (major depressive disorder), recurrent episode, moderate ([place]) 01/27/2021   GAD (generalized anxiety disorder) 01/27/2021   IBS (irritable bowel syndrome) 05/19/2017   Tobacco abuse 05/19/2017   Low back pain 06/13/2016   Hyperlipidemia 11/02/2015   Panic disorder with agoraphobia 10/08/2013   Panic attacks 10/07/2013   Major depression, recurrent ([place]) 10/07/2013   Anxiety 10/06/2013   Depression 10/06/2013   Vitamin D insufficiency 06/26/2013   Hypertension 06/22/2013   IUD (intrauterine device) in place 06/22/2013   Cervical cancer screening 06/22/2013    PCP: Charlott Rakes  REFERRING PROVIDER: Charlett Blake, MD  REFERRING DIAG: M43.16 (ICD-10-CM) - Spondylolisthesis, lumbar region  Rationale for Evaluation and Treatment: Rehabilitation  THERAPY DIAG:  Muscle weakness (generalized)  Other low back pain  Sciatica, right side  Other abnormalities of gait and mobility  ONSET DATE: ~15 years ago  SUBJECTIVE:                                                                                                                                                                                           SUBJECTIVE  STATEMENT: Pt states she enjoyed the pool. Reports she was sore for 1 day and half. Pt reports she walked in without a limp today.   PERTINENT HISTORY:  Chronic back pain From eval: Pt goes out of town once a week to help a friend -- helped with yard work and is hurting today. 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 Pt reports leg weakness and pain. Pt states pain of bending forward has been getting worse. Notes it hurts to  do dishes and laundry. By end of day pain is like a hornet's sting in R buttock, left one starts up to. Pt notes she is in a cycle of gaining weight. Pain is best in the morning 3/10. At most 8/10 by the end of day (around 5pm). States she has to stretch to keep pain at bay during the day.   PAIN:  Are you having pain? Yes: NPRS scale: 3/10 Pain location: Bilat buttocks (R worse than L) Pain description: Throbbing, dull, achy Aggravating factors: Bending forward Relieving factors: Pain medication, stretching  PRECAUTIONS: Fall  WEIGHT BEARING RESTRICTIONS: No  FALLS:  Has patient fallen in last 6 months? Yes. 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 L3-L4 right eccentric disc bulge may contact the exiting right L3 nerve. 2. L5-S1 mild left neural foraminal narrowing. 3. Narrowing of the right lateral recess at L1-L2 and left lateral recess at L2-L3 could affect the descending  right L2 and left L3 nerves, respectively.  PATIENT SURVEYS:  FOTO 44; predicted 54  MUSCLE LENGTH: Hamstrings: Right 65 deg; Left 75 deg Thomas test: Did not assess  POSTURE: decreased lumbar lordosis  PALPATION: TTP bilat glutes and piriformis (worse R than L)  LUMBAR ROM:   AROM eval  Flexion 60%  Extension 25%*  Right lateral flexion 50%*  Left lateral flexion 60%*  Right rotation 25%  Left rotation 25%   (Blank rows = not tested) * = pain  LOWER EXTREMITY ROM:     Active  Right eval Left eval  Hip flexion    Hip extension    Hip abduction    Hip adduction    Hip internal rotation    Hip external rotation    Knee flexion    Knee extension    Ankle dorsiflexion    Ankle plantarflexion    Ankle inversion    Ankle eversion     (Blank rows = not tested)  LOWER EXTREMITY MMT:    MMT Right eval Left eval  Hip flexion 4- 4+  Hip extension 3 3  Hip abduction 3 4  Hip adduction    Hip internal rotation    Hip external rotation    Knee flexion 4- 4+  Knee extension 4+ 4+  Ankle dorsiflexion 4 4  Ankle plantarflexion 4 4  Ankle inversion    Ankle eversion     (Blank rows = not tested)  LUMBAR SPECIAL TESTS:  Slump test: Positive on R pulls up/down spine SLR: Positive on R  FUNCTIONAL TESTS:  5 times sit to stand: 10.03 sec L SLS: 22.39 sec; R SLS: 11.96 sec (with trendelenburg)   GAIT: Distance walked: 100' Assistive device utilized: None Level of assistance: Complete Independence Comments: Mildly antalgic  TODAY'S TREATMENT:   [facility] Adult PT Treatment:  DATE: 05/09/22 Therapeutic Exercise: Nustep L5 x 6 min UEs/LEs Side step green TB x3 at counter Forwards/Backwards monster walk green TB x3 at counter Palloff press red TB doubled 2x10x5 sec Shoulder ext red TB 2x10x5 sec   TREATMENT 05/05/22:  Aquatic therapy at [address] - therapeutic pool temp 92 degrees Pt enters  building independently.  Treatment took place in water 3.8 to  4 ft 8 in.feet deep depending upon activity.  Pt entered and exited the pool via stair and handrails    Aquatic Therapy:  Water walking for warm up fwd/lat/bkwds  Standing: Standing hip abd 20x Standing hip ext 20x Kickboard push/pull Gentle pain free rotation with kickboard Noodle stomp Lateral walking with band at toes Monster walking fwd and bkwd band at toes Standing row with abdominal contraction  Sit to stand 10x Step up 2x10 ea  Pt requires the buoyancy of water for active assisted exercises with buoyancy supported for strengthening and AROM exercises. Hydrostatic pressure also supports joints by unweighting joint load by at least 50 % in 3-4 feet depth water. 80% in chest to neck deep water. Water will provide assistance with movement using the current and laminar flow while the buoyancy reduces weight bearing. Pt requires the viscosity of the water for resistance with strengthening exercises.   [facility] Adult PT Treatment:                                                DATE: 05/02/22 Therapeutic Exercise: Nustep L4 x 5 min Seated figure 4 stretch x 30 sec Supine LTR 2x30 sec Supine PPT 10x 5 sec Supine PPT with marching 2x10 Supine bridge 2x10 Standing hip abd + ext red TB 2x10 Standing side stepping red TB 2x10 Palloff press red TB doubled 2x10x5 sec Row red TB 2x10 Seated hamstring stretch 2x30 sec   [facility] Adult PT Treatment:                                                DATE: 04/25/22 Therapeutic Exercise: Seated figure 4 stretch x 30 sec Seated piriformis stretch x 30 sec Supine PPT 10x5 sec PPT + marching x10 Bridge 2x10 S/L clamshell 2x10 R&L Supine hamstring stretch with strap x30 sec Prone hip ext 2x10 Self Care: Self massage and TPR on R QL and lumbar paraspinals                                                                                                                             DATE:  04/11/22  See HEP  PATIENT EDUCATION:  Education details: Exam findings, POC, aquatics, initial HEP Person educated: Patient Education method: Explanation, Demonstration, and Handouts Education comprehension:  verbalized understanding, returned demonstration, and needs further education  HOME EXERCISE PROGRAM: Access Code: [email or web address] URL: [email or web address] Date: 05/02/2022 Prepared by: Estill Bamberg April Thurnell Garbe  Exercises - Seated Figure 4 Piriformis Stretch  - 1 x daily - 7 x weekly - 2 sets - 30 sec hold - Seated Piriformis Stretch  - 1 x daily - 7 x weekly - 2 sets - 30 sec hold - Standing 'L' Stretch at Counter  - 1 x daily - 7 x weekly - 2 sets - 30 sec hold - Clamshell  - 1 x daily - 7 x weekly - 2 sets - 10 reps - Supine Posterior Pelvic Tilt  - 1 x daily - 7 x weekly - 2 sets - 10 reps - Supine March with Posterior Pelvic Tilt  - 1 x daily - 7 x weekly - 2 sets - 10 reps - Supine Bridge  - 1 x daily - 7 x weekly - 2 sets - 10 reps - Standing Anti-Rotation Press with Anchored Resistance  - 1 x daily - 7 x weekly - 2 sets - 10 reps - 5 sec hold - Standing Bilateral Low Shoulder Row with Anchored Resistance  - 1 x daily - 7 x weekly - 2 sets - 10 reps  ASSESSMENT:  CLINICAL IMPRESSION: Pt able to tolerate all exercises in standing this session. Pt able to tolerate increased resistance with green TB. Pt continues to progress well with therapy.   OBJECTIVE IMPAIRMENTS: Abnormal gait, decreased balance, decreased endurance, decreased mobility, difficulty walking, decreased strength, increased fascial restrictions, increased muscle spasms, impaired flexibility, impaired UE functional use, improper body mechanics, postural dysfunction, and pain.   ACTIVITY LIMITATIONS: carrying, lifting, bending, standing, sleeping, stairs, transfers, bed mobility, toileting, dressing, hygiene/grooming, locomotion level, and caring for others  PARTICIPATION LIMITATIONS: meal prep,  cleaning, laundry, shopping, community activity, occupation, and yard work  PERSONAL FACTORS: Fitness, Past/current experiences, and Time since onset of injury/illness/exacerbation are also affecting patient's functional outcome.   REHAB POTENTIAL: Good  CLINICAL DECISION MAKING: Evolving/moderate complexity  EVALUATION COMPLEXITY: Moderate   GOALS: Goals reviewed with patient? Yes  SHORT TERM GOALS: Target date: 05/09/2022   Pt will be ind with initial HEP Baseline: Goal status: MET  2.  Pt will demo at least 10 deg improvement with hamstring length in SLR for decreased R LE neural tension Baseline:  Goal status: MET  3.  Pt will be ind with maintaining proper lifting mechanics for work and home tasks Baseline:  Goal status: IN PROGRESS    LONG TERM GOALS: Target date: 06/06/2022   Pt will be ind with management and progression of HEP Baseline:  Goal status: INITIAL  2.  Pt will maintain SLS for at least 37 sec R&L to be within age norms to demo improved LE and core stability Baseline:  Goal status: INITIAL  3.  Pt will report reduced pain by >/=50% Baseline:  Goal status: INITIAL  4.  Pt will have 10% improvement in lumbar ROM with </=2/10 pain Baseline:  Goal status: INITIAL  5.  Pt will have improved FOTO to >/=54 Baseline:  Goal status: INITIAL  PLAN:  PT FREQUENCY: 2x/week  PT DURATION: 8 weeks  PLANNED INTERVENTIONS: Therapeutic exercises, Therapeutic activity, Neuromuscular re-education, Balance training, Gait training, Patient/Family education, Self Care, Joint mobilization, Stair training, Aquatic Therapy, Dry Needling, Electrical stimulation, Spinal manipulation, Spinal mobilization, Cryotherapy, Moist heat, scar mobilization, Taping, Traction, Ionotophoresis 4mg /ml Dexamethasone, Manual therapy, and Re-evaluation.  PLAN FOR NEXT SESSION: Assess response  to HEP. Continue core and hip strengthening. Work on Midwife.    Trajon Rosete April Ma  L Rosabella [place], PT 05/09/2022, 2:29 PM

## 2022-05-12 ENCOUNTER — Ambulatory Visit: Payer: Medicaid Other

## 2022-05-12 DIAGNOSIS — M5431 Sciatica, right side: Secondary | ICD-10-CM

## 2022-05-12 DIAGNOSIS — M25551 Pain in right hip: Secondary | ICD-10-CM

## 2022-05-12 DIAGNOSIS — R2689 Other abnormalities of gait and mobility: Secondary | ICD-10-CM

## 2022-05-12 DIAGNOSIS — M25552 Pain in left hip: Secondary | ICD-10-CM

## 2022-05-12 DIAGNOSIS — M5459 Other low back pain: Secondary | ICD-10-CM

## 2022-05-12 DIAGNOSIS — M6281 Muscle weakness (generalized): Secondary | ICD-10-CM

## 2022-05-12 NOTE — Therapy (Signed)
OUTPATIENT PHYSICAL THERAPY TREATMENT   Patient Name: Stacy Barrett MRN: [id number] DOB:08/04/1965, 57 y.o., female Today's Date: 05/12/2022  END OF SESSION:  PT End of Session - 05/12/22 1546     Visit Number 6    Number of Visits 16    Date for PT Re-Evaluation 06/06/22    Authorization Type Medicaid    Authorization Time Period 10 visits apprived 04/25/22-06/24/22    Authorization - Visit Number 5    Authorization - Number of Visits 10    PT Start Time 1545   Pt arrived 15 mins late   PT Stop Time 1615    PT Time Calculation (min) 30 min                Past Medical History:  Diagnosis Date   Anxiety    Depression    Edema    Hypertension    Lumbar radiculopathy    Pituitary adenoma ([place])    Vertebral fracture    History reviewed. No pertinent surgical history. Patient Active Problem List   Diagnosis Date Noted   MDD (major depressive disorder), recurrent episode, moderate ([place]) 01/27/2021   GAD (generalized anxiety disorder) 01/27/2021   IBS (irritable bowel syndrome) 05/19/2017   Tobacco abuse 05/19/2017   Low back pain 06/13/2016   Hyperlipidemia 11/02/2015   Panic disorder with agoraphobia 10/08/2013   Panic attacks 10/07/2013   Major depression, recurrent ([place]) 10/07/2013   Anxiety 10/06/2013   Depression 10/06/2013   Vitamin D insufficiency 06/26/2013   Hypertension 06/22/2013   IUD (intrauterine device) in place 06/22/2013   Cervical cancer screening 06/22/2013    PCP: Charlott Rakes  REFERRING PROVIDER: Charlett Blake, MD  REFERRING DIAG: M43.16 (ICD-10-CM) - Spondylolisthesis, lumbar region  Rationale for Evaluation and Treatment: Rehabilitation  THERAPY DIAG:  Muscle weakness (generalized)  Other low back pain  Sciatica, right side  Other abnormalities of gait and mobility  Pain in left hip  Pain in right hip  ONSET DATE: ~15 years ago  SUBJECTIVE:                                                                                                                                                                                            SUBJECTIVE STATEMENT: Patient reports decreased overall back pain today.    PERTINENT HISTORY:  Chronic back pain From eval: Pt goes out of town once a week to help a friend -- helped with yard work and is hurting today. 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 Pt reports leg weakness  and pain. Pt states pain of bending forward has been getting worse. Notes it hurts to do dishes and laundry. By end of day pain is like a hornet's sting in R buttock, left one starts up to. Pt notes she is in a cycle of gaining weight. Pain is best in the morning 3/10. At most 8/10 by the end of day (around 5pm). States she has to stretch to keep pain at bay during the day.   PAIN:  Are you having pain? Yes: NPRS scale: 3/10 Pain location: Bilat buttocks (R worse than L) Pain description: Throbbing, dull, achy Aggravating factors: Bending forward Relieving factors: Pain medication, stretching  PRECAUTIONS: Fall  WEIGHT BEARING RESTRICTIONS: No  FALLS:  Has patient fallen in last 6 months? Yes. 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 L3-L4 right eccentric disc bulge may contact the exiting right L3 nerve. 2. L5-S1 mild left neural foraminal narrowing. 3. Narrowing of the right lateral recess at L1-L2 and  left lateral recess at L2-L3 could affect the descending right L2 and left L3 nerves, respectively.  PATIENT SURVEYS:  FOTO 44; predicted 54  MUSCLE LENGTH: Hamstrings: Right 65 deg; Left 75 deg Thomas test: Did not assess  POSTURE: decreased lumbar lordosis  PALPATION: TTP bilat glutes and piriformis (worse R than L)  LUMBAR ROM:   AROM eval  Flexion 60%  Extension 25%*  Right lateral flexion 50%*  Left lateral flexion 60%*  Right rotation 25%  Left rotation 25%   (Blank rows = not tested) * = pain  LOWER EXTREMITY ROM:     Active  Right eval Left eval  Hip flexion    Hip extension    Hip abduction    Hip adduction    Hip internal rotation    Hip external rotation    Knee flexion    Knee extension    Ankle dorsiflexion    Ankle plantarflexion    Ankle inversion    Ankle eversion     (Blank rows = not tested)  LOWER EXTREMITY MMT:    MMT Right eval Left eval  Hip flexion 4- 4+  Hip extension 3 3  Hip abduction 3 4  Hip adduction    Hip internal rotation    Hip external rotation    Knee flexion 4- 4+  Knee extension 4+ 4+  Ankle dorsiflexion 4 4  Ankle plantarflexion 4 4  Ankle inversion    Ankle eversion     (Blank rows = not tested)  LUMBAR SPECIAL TESTS:  Slump test: Positive on R pulls up/down spine SLR: Positive on R  FUNCTIONAL TESTS:  5 times sit to stand: 10.03 sec L SLS: 22.39 sec; R SLS: 11.96 sec (with trendelenburg)   GAIT: Distance walked: 100' Assistive device utilized: None Level of assistance: Complete Independence Comments: Mildly antalgic  TODAY'S TREATMENT:   [facility] Adult PT Treatment:  DATE: 05/12/22 Therapeutic Exercise: Nustep L6 x 3 min UEs/LEs Side step green TB x3 at counter Forwards/Backwards monster walk green TB x2 at counter Palloff press 7# 2x10 BIL Shoulder ext 2 3# cables 2x10 - cues for core activation  Supine PPT 5" hold 2x10   [facility] Adult PT Treatment:                                                 DATE: 05/09/22 Therapeutic Exercise: Nustep L5 x 6 min UEs/LEs Side step green TB x3 at counter Forwards/Backwards monster walk green TB x3 at counter Palloff press red TB doubled 2x10x5 sec Shoulder ext red TB 2x10x5 sec   TREATMENT 05/05/22:  Aquatic therapy at [address] - therapeutic pool temp 92 degrees Pt enters building independently.  Treatment took place in water 3.8 to  4 ft 8 in.feet deep depending upon activity.  Pt entered and exited the pool via stair and handrails    Aquatic Therapy:  Water walking for warm up fwd/lat/bkwds  Standing: Standing hip abd 20x Standing hip ext 20x Kickboard push/pull Gentle pain free rotation with kickboard Noodle stomp Lateral walking with band at toes Monster walking fwd and bkwd band at toes Standing row with abdominal contraction  Sit to stand 10x Step up 2x10 ea  Pt requires the buoyancy of water for active assisted exercises with buoyancy supported for strengthening and AROM exercises. Hydrostatic pressure also supports joints by unweighting joint load by at least 50 % in 3-4 feet depth water. 80% in chest to neck deep water. Water will provide assistance with movement using the current and laminar flow while the buoyancy reduces weight bearing. Pt requires the viscosity of the water for resistance with strengthening exercises.                                                                                                                                PATIENT EDUCATION:  Education details: Exam findings, POC, aquatics, initial HEP Person educated: Patient Education method: Explanation, Demonstration, and Handouts Education comprehension: verbalized understanding, returned demonstration, and needs further education  HOME EXERCISE PROGRAM: Access Code: [email or web address] URL: [email or web address] Date: 05/02/2022 Prepared by: Estill Bamberg April Thurnell Garbe  Exercises - Seated Figure 4 Piriformis Stretch  - 1 x daily - 7 x weekly - 2 sets - 30 sec hold - Seated Piriformis Stretch  - 1 x daily - 7 x weekly - 2 sets - 30 sec hold - Standing 'L' Stretch at Counter  - 1 x daily - 7 x weekly - 2 sets - 30 sec hold - Clamshell  - 1 x daily - 7 x weekly - 2 sets - 10 reps - Supine Posterior Pelvic Tilt  - 1 x daily - 7 x weekly - 2 sets -  10 reps - Supine March with Posterior Pelvic Tilt  - 1 x daily - 7 x weekly - 2 sets - 10 reps - Supine Bridge  - 1 x daily - 7 x weekly - 2 sets - 10 reps - Standing Anti-Rotation Press with Anchored Resistance  - 1 x daily - 7 x weekly - 2 sets - 10 reps - 5 sec hold - Standing Bilateral Low Shoulder Row with Anchored Resistance  - 1 x daily - 7 x weekly - 2 sets - 10 reps  ASSESSMENT:  CLINICAL IMPRESSION: Patient arrived 15 minutes late to appointment, truncating session. Session today continued to focus on core and proximal hip strengthening with increased resistance today to good effect, no increase in pain throughout. Patient was able to tolerate all prescribed exercises with no adverse effects. Patient continues to benefit from skilled PT services and should be progressed as able to improve functional independence.    OBJECTIVE IMPAIRMENTS: Abnormal gait, decreased balance, decreased endurance, decreased mobility, difficulty walking, decreased strength, increased fascial restrictions, increased muscle spasms, impaired flexibility, impaired UE functional use, improper body mechanics, postural dysfunction, and pain.   ACTIVITY LIMITATIONS: carrying, lifting, bending, standing, sleeping, stairs, transfers, bed mobility, toileting, dressing, hygiene/grooming, locomotion level, and caring for others  PARTICIPATION LIMITATIONS: meal prep, cleaning, laundry, shopping, community activity, occupation, and yard work  PERSONAL FACTORS: Fitness, Past/current experiences, and Time since onset of  injury/illness/exacerbation are also affecting patient's functional outcome.   REHAB POTENTIAL: Good  CLINICAL DECISION MAKING: Evolving/moderate complexity  EVALUATION COMPLEXITY: Moderate   GOALS: Goals reviewed with patient? Yes  SHORT TERM GOALS: Target date: 05/09/2022   Pt will be ind with initial HEP Baseline: Goal status: MET  2.  Pt will demo at least 10 deg improvement with hamstring length in SLR for decreased R LE neural tension Baseline:  Goal status: MET  3.  Pt will be ind with maintaining proper lifting mechanics for work and home tasks Baseline:  Goal status: IN PROGRESS    LONG TERM GOALS: Target date: 06/06/2022   Pt will be ind with management and progression of HEP Baseline:  Goal status: INITIAL  2.  Pt will maintain SLS for at least 37 sec R&L to be within age norms to demo improved LE and core stability Baseline:  Goal status: INITIAL  3.  Pt will report reduced pain by >/=50% Baseline:  Goal status: INITIAL  4.  Pt will have 10% improvement in lumbar ROM with </=2/10 pain Baseline:  Goal status: INITIAL  5.  Pt will have improved FOTO to >/=54 Baseline:  Goal status: INITIAL  PLAN:  PT FREQUENCY: 2x/week  PT DURATION: 8 weeks  PLANNED INTERVENTIONS: Therapeutic exercises, Therapeutic activity, Neuromuscular re-education, Balance training, Gait training, Patient/Family education, Self Care, Joint mobilization, Stair training, Aquatic Therapy, Dry Needling, Electrical stimulation, Spinal manipulation, Spinal mobilization, Cryotherapy, Moist heat, scar mobilization, Taping, Traction, Ionotophoresis 4mg /ml Dexamethasone, Manual therapy, and Re-evaluation.  PLAN FOR NEXT SESSION: Assess response to HEP. Continue core and hip strengthening. Work on Midwife.    Margarette Canada, PTA 05/12/2022, 4:18 PM

## 2022-05-17 ENCOUNTER — Ambulatory Visit: Payer: Medicaid Other

## 2022-05-18 NOTE — Progress Notes (Deleted)
  PROCEDURE RECORD [facility]   Name: Stacy Barrett DOB:[birth year] MRN: [id number]  Date:05/18/2022  Physician: Alysia Penna, MD    Nurse/CMA: Jorja Loa MA  Allergies:  Allergies  Allergen Reactions   Other Other (See Comments)    MSG -Very intense headaches   Penicillins Hives, Other (See Comments) and Rash    Welps  Welps  Welps   Prednisone Itching, Rash and Swelling    Swelling of lips and face  Swelling of lips and face  Swelling of lips and face   Sulfa Antibiotics Hives, Itching and Other (See Comments)    Welps   Monosodium Glutamate Other (See Comments) and Swelling   Hydrocodone Swelling    Noted swelling of tongue, knees, fingers and toes.  Transient.  No throat swelling.  Noted swelling of tongue, knees, fingers and toes.  Transient.  No throat swelling.  Noted swelling of tongue, knees, fingers and toes.  Transient.  No throat swelling.    Consent Signed: {yes [id number]  Is patient diabetic? {yes no:314532}  CBG today? ***  Pregnant: {yes no:314532} LMP: No LMP recorded. (age 29-55)  Anticoagulants: {Yes/No:19989} Anti-inflammatory: {Yes/No:19989} Antibiotics: {Yes/No:19989}  Procedure: Right L2 & Right  L3 Transforaminal ESI  Position: Prone Start Time: ***  End Time: ***  Fluoro Time: ***  RN/CMA      Time      BP      Pulse      Respirations      O2 Sat      S/S      Pain Level       D/C home with ***, patient A & O X 3, D/C instructions reviewed, and sits independently.         Subjective:    Patient ID: Stacy Barrett, female    DOB: 16-Mar-1965, 57 y.o.   MRN: [id number]  HPI    Review of Systems     Objective:   Physical Exam        Assessment & Plan:

## 2022-05-19 ENCOUNTER — Ambulatory Visit: Payer: Medicaid Other | Admitting: Physical Therapy

## 2022-05-19 NOTE — Therapy (Deleted)
OUTPATIENT PHYSICAL THERAPY TREATMENT   Patient Name: Stacy Barrett MRN: [id number] DOB:[birth year], 57 y.o., female Today's Date: 05/19/2022  END OF SESSION:       Past Medical History:  Diagnosis Date   Anxiety    Depression    Edema    Hypertension    Lumbar radiculopathy    Pituitary adenoma ([place])    Vertebral fracture    No past surgical history on file. Patient Active Problem List   Diagnosis Date Noted   MDD (major depressive disorder), recurrent episode, moderate 01/27/2021   GAD (generalized anxiety disorder) 01/27/2021   IBS (irritable bowel syndrome) 05/19/2017   Tobacco abuse 05/19/2017   Low back pain 06/13/2016   Hyperlipidemia 11/02/2015   Panic disorder with agoraphobia 10/08/2013   Panic attacks 10/07/2013   Major depression, recurrent ([place]) 10/07/2013   Anxiety 10/06/2013   Depression 10/06/2013   Vitamin D insufficiency 06/26/2013   Hypertension 06/22/2013   IUD (intrauterine device) in place 06/22/2013   Cervical cancer screening 06/22/2013    PCP: Charlott Rakes  REFERRING PROVIDER: Charlett Blake, MD  REFERRING DIAG: M43.16 (ICD-10-CM) - Spondylolisthesis, lumbar region  Rationale for Evaluation and Treatment: Rehabilitation  THERAPY DIAG:  No diagnosis found.  ONSET DATE: ~15 years ago  SUBJECTIVE:                                                                                                                                                                                           SUBJECTIVE STATEMENT: ***  PERTINENT HISTORY:  Chronic back pain From eval: Pt goes out of town once a week to help a friend -- helped with yard work and is hurting today. 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 Pt reports leg weakness and pain. Pt states pain of bending forward has been getting worse. Notes it hurts to do dishes and laundry. By end of day pain is like a  hornet's sting in R buttock, left one starts up to. Pt notes she is in a cycle of gaining weight. Pain is best in the morning 3/10. At most 8/10 by the end of day (around 5pm). States she has to stretch to keep pain at bay during the day.   PAIN:  Are you having pain? Yes: NPRS scale: 3/10 Pain location: Bilat buttocks (R worse than L) Pain description: Throbbing, dull, achy Aggravating factors: Bending forward Relieving factors: Pain medication, stretching  PRECAUTIONS: Fall  WEIGHT BEARING RESTRICTIONS: No  FALLS:  Has patient fallen in last 6 months? Yes. 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 L3-L4 right eccentric disc bulge may contact the exiting right L3 nerve. 2. L5-S1 mild left neural foraminal narrowing. 3. Narrowing of the right lateral recess at L1-L2 and left lateral recess at L2-L3 could affect the descending right L2 and left L3 nerves, respectively.  PATIENT SURVEYS:  FOTO 44; predicted 54  MUSCLE LENGTH: Hamstrings: Right 65 deg; Left 75 deg Thomas test: Did not assess  POSTURE: decreased lumbar lordosis  PALPATION: TTP bilat glutes and piriformis (worse R than L)  LUMBAR ROM:   AROM eval  Flexion 60%  Extension 25%*  Right lateral flexion 50%*  Left lateral flexion 60%*  Right rotation 25%  Left rotation 25%   (Blank rows = not tested) * = pain  LOWER EXTREMITY ROM:     Active  Right eval Left eval  Hip flexion    Hip extension     Hip abduction    Hip adduction    Hip internal rotation    Hip external rotation    Knee flexion    Knee extension    Ankle dorsiflexion    Ankle plantarflexion    Ankle inversion    Ankle eversion     (Blank rows = not tested)  LOWER EXTREMITY MMT:    MMT Right eval Left eval  Hip flexion 4- 4+  Hip extension 3 3  Hip abduction 3 4  Hip adduction    Hip internal rotation    Hip external rotation    Knee flexion 4- 4+  Knee extension 4+ 4+  Ankle dorsiflexion 4 4  Ankle plantarflexion 4 4  Ankle inversion    Ankle eversion     (Blank rows = not tested)  LUMBAR SPECIAL TESTS:  Slump test: Positive on R pulls up/down spine SLR: Positive on R  FUNCTIONAL TESTS:  5 times sit to stand: 10.03 sec L SLS: 22.39 sec; R SLS: 11.96 sec (with trendelenburg)   GAIT: Distance walked: 100' Assistive device utilized: None Level of assistance: Complete Independence Comments: Mildly antalgic  TODAY'S TREATMENT:    TREATMENT 05/19/22:  Aquatic therapy at [address] - therapeutic pool temp 92 degrees Pt enters building independently.  Treatment took place in water 3.8 to  4 ft 8 in.feet deep depending upon activity.  Pt entered and exited the pool via stair and handrails    Aquatic Therapy:  Water walking for warm up fwd/lat/bkwds  Standing: Standing hip abd 20x Standing hip ext 20x Kickboard push/pull Gentle pain free rotation with kickboard Noodle stomp Lateral walking with band at toes Monster walking fwd and bkwd band at toes Standing row with abdominal contraction  Sit to stand 10x Step up 2x10 ea  Pt requires the buoyancy of water for active assisted exercises with buoyancy supported for strengthening and AROM exercises. Hydrostatic pressure also supports joints by unweighting joint load by at least 50 % in 3-4 feet depth water. 80% in chest to neck deep water. Water will provide assistance with movement using  the current and laminar flow  while the buoyancy reduces weight bearing. Pt requires the viscosity of the water for resistance with strengthening exercises.  [facility] Treatment:                                                DATE: 05/12/22 Therapeutic Exercise: Nustep L6 x 3 min UEs/LEs Side step green TB x3 at counter Forwards/Backwards monster walk green TB x2 at counter Palloff press 7# 2x10 BIL Shoulder ext 2 3# cables 2x10 - cues for core activation  Supine PPT 5" hold 2x10   [facility] Adult PT Treatment:                                                DATE: 05/09/22 Therapeutic Exercise: Nustep L5 x 6 min UEs/LEs Side step green TB x3 at counter Forwards/Backwards monster walk green TB x3 at counter Palloff press red TB doubled 2x10x5 sec Shoulder ext red TB 2x10x5 sec   TREATMENT 05/05/22:  Aquatic therapy at [address] - therapeutic pool temp 92 degrees Pt enters building independently.  Treatment took place in water 3.8 to  4 ft 8 in.feet deep depending upon activity.  Pt entered and exited the pool via stair and handrails    Aquatic Therapy:  Water walking for warm up fwd/lat/bkwds  Standing: Standing hip abd 20x Standing hip ext 20x Kickboard push/pull Gentle pain free rotation with kickboard Noodle stomp Lateral walking with band at toes Monster walking fwd and bkwd band at toes Standing row with abdominal contraction  Sit to stand 10x Step up 2x10 ea  Pt requires the buoyancy of water for active assisted exercises with buoyancy supported for strengthening and AROM exercises. Hydrostatic pressure also supports joints by unweighting joint load by at least 50 % in 3-4 feet depth water. 80% in chest to neck deep water. Water will provide assistance with movement using the current and laminar flow while the buoyancy reduces weight bearing. Pt requires the viscosity of the water for resistance with strengthening exercises.                                                                                                                                 PATIENT EDUCATION:  Education details: Exam findings, POC, aquatics, initial HEP Person educated: Patient Education method: Explanation, Demonstration, and Handouts Education comprehension: verbalized understanding, returned demonstration, and needs further education  HOME EXERCISE PROGRAM: Access Code: [email or web address] URL: [email or web address] Date: 05/02/2022 Prepared by: Estill Bamberg April Thurnell Garbe  Exercises - Seated Figure 4 Piriformis Stretch  - 1 x daily - 7 x weekly - 2 sets - 30 sec hold - Seated  Piriformis Stretch  - 1 x daily - 7 x weekly - 2 sets - 30 sec hold - Standing 'L' Stretch at [employer]  - 1 x daily - 7 x weekly - 2 sets - 30 sec hold - Clamshell  - 1 x daily - 7 x weekly - 2 sets - 10 reps - Supine Posterior Pelvic Tilt  - 1 x daily - 7 x weekly - 2 sets - 10 reps - Supine March with Posterior Pelvic Tilt  - 1 x daily - 7 x weekly - 2 sets - 10 reps - Supine Bridge  - 1 x daily - 7 x weekly - 2 sets - 10 reps - Standing Anti-Rotation Press with Anchored Resistance  - 1 x daily - 7 x weekly - 2 sets - 10 reps - 5 sec hold - Standing Bilateral Low Shoulder Row with Anchored Resistance  - 1 x daily - 7 x weekly - 2 sets - 10 reps  ASSESSMENT:  CLINICAL IMPRESSION: Session today focused on *** in the aquatic environment for use of buoyancy to offload joints and the viscosity of water as resistance during therapeutic exercise.  ***.  Patient was able to tolerate all prescribed exercises in the aquatic environment with no adverse effects and reports ***/10 pain at the end of the session. Patient continues to benefit from skilled PT services on land and aquatic based and should be progressed as able to improve functional independence.     OBJECTIVE IMPAIRMENTS: Abnormal gait, decreased balance, decreased endurance, decreased mobility, difficulty walking, decreased strength, increased fascial  restrictions, increased muscle spasms, impaired flexibility, impaired UE functional use, improper body mechanics, postural dysfunction, and pain.   ACTIVITY LIMITATIONS: carrying, lifting, bending, standing, sleeping, stairs, transfers, bed mobility, toileting, dressing, hygiene/grooming, locomotion level, and caring for others  PARTICIPATION LIMITATIONS: meal prep, cleaning, laundry, shopping, community activity, occupation, and yard work  PERSONAL FACTORS: Fitness, Past/current experiences, and Time since onset of injury/illness/exacerbation are also affecting patient's functional outcome.   REHAB POTENTIAL: Good  CLINICAL DECISION MAKING: Evolving/moderate complexity  EVALUATION COMPLEXITY: Moderate   GOALS: Goals reviewed with patient? Yes  SHORT TERM GOALS: Target date: 05/09/2022   Pt will be ind with initial HEP Baseline: Goal status: MET  2.  Pt will demo at least 10 deg improvement with hamstring length in SLR for decreased R LE neural tension Baseline:  Goal status: MET  3.  Pt will be ind with maintaining proper lifting mechanics for work and home tasks Baseline:  Goal status: IN PROGRESS    LONG TERM GOALS: Target date: 06/06/2022   Pt will be ind with management and progression of HEP Baseline:  Goal status: INITIAL  2.  Pt will maintain SLS for at least 37 sec R&L to be within age norms to demo improved LE and core stability Baseline:  Goal status: INITIAL  3.  Pt will report reduced pain by >/=50% Baseline:  Goal status: INITIAL  4.  Pt will have 10% improvement in lumbar ROM with </=2/10 pain Baseline:  Goal status: INITIAL  5.  Pt will have improved FOTO to >/=54 Baseline:  Goal status: INITIAL  PLAN:  PT FREQUENCY: 2x/week  PT DURATION: 8 weeks  PLANNED INTERVENTIONS: Therapeutic exercises, Therapeutic activity, Neuromuscular re-education, Balance training, Gait training, Patient/Family education, Self Care, Joint mobilization, Stair  training, Aquatic Therapy, Dry Needling, Electrical stimulation, Spinal manipulation, Spinal mobilization, Cryotherapy, Moist heat, scar mobilization, Taping, Traction, Ionotophoresis 4mg /ml Dexamethasone, Manual therapy, and Re-evaluation.  PLAN FOR  NEXT SESSION: Assess response to HEP. Continue core and hip strengthening. Work on Midwife.    Mathis Dad, PT 05/19/2022, 11:32 AM

## 2022-05-24 ENCOUNTER — Ambulatory Visit: Payer: Medicaid Other | Attending: Physical Medicine & Rehabilitation

## 2022-05-24 ENCOUNTER — Telehealth: Payer: Self-pay

## 2022-05-24 DIAGNOSIS — M25552 Pain in left hip: Secondary | ICD-10-CM

## 2022-05-24 DIAGNOSIS — R2689 Other abnormalities of gait and mobility: Secondary | ICD-10-CM

## 2022-05-24 DIAGNOSIS — M25551 Pain in right hip: Secondary | ICD-10-CM

## 2022-05-24 DIAGNOSIS — M6281 Muscle weakness (generalized): Secondary | ICD-10-CM | POA: Diagnosis present

## 2022-05-24 DIAGNOSIS — M5459 Other low back pain: Secondary | ICD-10-CM | POA: Diagnosis present

## 2022-05-24 DIAGNOSIS — M5431 Sciatica, right side: Secondary | ICD-10-CM

## 2022-05-24 NOTE — Therapy (Signed)
OUTPATIENT PHYSICAL THERAPY TREATMENT   Patient Name: CHASTY RANDAL MRN: [id number] DOB:March 31, 1965, 57 y.o., female Today's Date: 05/24/2022  END OF SESSION:  PT End of Session - 05/24/22 1448     Visit Number 7    Number of Visits 16    Date for PT Re-Evaluation 06/06/22    Authorization Type Medicaid    Authorization Time Period 10 visits apprived 04/25/22-06/24/22    Authorization - Visit Number 6    Authorization - Number of Visits 10    PT Start Time 1447    PT Stop Time 1527    PT Time Calculation (min) 40 min    Activity Tolerance Patient tolerated treatment well    Behavior During Therapy WFL for tasks assessed/performed                 Past Medical History:  Diagnosis Date   Anxiety    Depression    Edema    Hypertension    Lumbar radiculopathy    Pituitary adenoma    Vertebral fracture    History reviewed. No pertinent surgical history. Patient Active Problem List   Diagnosis Date Noted   MDD (major depressive disorder), recurrent episode, moderate 01/27/2021   GAD (generalized anxiety disorder) 01/27/2021   IBS (irritable bowel syndrome) 05/19/2017   Tobacco abuse 05/19/2017   Low back pain 06/13/2016   Hyperlipidemia 11/02/2015   Panic disorder with agoraphobia 10/08/2013   Panic attacks 10/07/2013   Major depression, recurrent (HCC) 10/07/2013   Anxiety 10/06/2013   Depression 10/06/2013   Vitamin D insufficiency 06/26/2013   Hypertension 06/22/2013   IUD (intrauterine device) in place 06/22/2013   Cervical cancer screening 06/22/2013    PCP: Hoy Register  REFERRING PROVIDER: Erick Colace, MD  REFERRING DIAG: M43.16 (ICD-10-CM) - Spondylolisthesis, lumbar region  Rationale for Evaluation and Treatment: Rehabilitation  THERAPY DIAG:  Muscle weakness (generalized)  Other low back pain  Sciatica, right side  Other abnormalities of gait and mobility  Pain in left hip  Pain in right hip  ONSET DATE: ~15 years  ago  SUBJECTIVE:                                                                                                                                                                                           SUBJECTIVE STATEMENT: Patient reports that she did a lot of planting over the weekend and her back is more sore today.   PERTINENT HISTORY:  Chronic back pain From eval: Pt goes out of town once a week to help a friend -- helped with yard work and is hurting today. 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 Pt reports leg weakness and pain. Pt states pain of bending forward has been getting worse. Notes it hurts to do dishes and laundry. By end of day pain is like a hornet's sting in R buttock, left one starts up to. Pt notes she is in a cycle of gaining weight. Pain is best in the morning 3/10. At most 8/10 by the end of day (around 5pm). States she has to stretch to keep pain at bay during the day.   PAIN:  Are you having pain? Yes: NPRS scale: 4/10 Pain location: Bilat buttocks (R worse than L) Pain description: Throbbing, dull, achy Aggravating factors: Bending forward Relieving factors: Pain medication, stretching  PRECAUTIONS: Fall  WEIGHT BEARING RESTRICTIONS: No  FALLS:  Has patient fallen in last 6 months? Yes. 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 L3-L4 right eccentric disc bulge may contact  the exiting right L3 nerve. 2. L5-S1 mild left neural foraminal narrowing. 3. Narrowing of the right lateral recess at L1-L2 and left lateral recess at L2-L3 could affect the descending right L2 and left L3 nerves, respectively.  PATIENT SURVEYS:  FOTO 44; predicted 54  MUSCLE LENGTH: Hamstrings: Right 65 deg; Left 75 deg Thomas test: Did not assess  POSTURE: decreased lumbar lordosis  PALPATION: TTP bilat glutes and piriformis (worse R than L)  LUMBAR ROM:   AROM eval  Flexion 60%  Extension 25%*  Right lateral flexion 50%*  Left lateral flexion 60%*  Right rotation 25%  Left rotation 25%   (Blank rows = not tested) * = pain  LOWER EXTREMITY ROM:     Active  Right eval Left eval  Hip flexion    Hip extension    Hip abduction    Hip adduction    Hip internal rotation    Hip external rotation    Knee flexion    Knee extension    Ankle dorsiflexion    Ankle plantarflexion    Ankle inversion    Ankle eversion     (Blank rows = not tested)  LOWER EXTREMITY MMT:    MMT Right eval Left eval  Hip flexion 4- 4+  Hip extension 3 3  Hip abduction 3 4  Hip adduction    Hip internal rotation    Hip external rotation    Knee flexion 4- 4+  Knee extension 4+ 4+  Ankle dorsiflexion 4 4  Ankle plantarflexion 4 4  Ankle inversion    Ankle eversion     (Blank rows = not tested)  LUMBAR SPECIAL TESTS:  Slump test: Positive on R pulls up/down spine SLR: Positive on R  FUNCTIONAL TESTS:  5 times sit to stand: 10.03 sec L SLS: 22.39 sec; R SLS: 11.96 sec (with trendelenburg)   GAIT: Distance walked: 100' Assistive device utilized: None Level of assistance: Complete Independence Comments: Mildly antalgic  TODAY'S TREATMENT:   [facility] Adult  PT Treatment:                                                DATE: 05/24/22 Therapeutic Exercise: Nustep L6 x 6 min UEs/LEs Side step green TB x3 at counter Standing hip ext at counter GTB at ankles x10 BIL Palloff press  7# 2x10 BIL Shoulder ext 2 7# cables 2x10 - cues for core activation  Supine PPT 5" hold x10 Bridges 2x10 Supine figure 4 piriformis stretch 2x30" BIL push/pull Supine hamstring stretch x30" BIL  [facility] Adult PT Treatment:                                                DATE: 05/12/22 Therapeutic Exercise: Nustep L6 x 3 min UEs/LEs Side step green TB x3 at counter Forwards/Backwards monster walk green TB x2 at counter Palloff press 7# 2x10 BIL Shoulder ext 2 3# cables 2x10 - cues for core activation  Supine PPT 5" hold 2x10   [facility] Adult PT Treatment:                                                DATE: 05/09/22 Therapeutic Exercise: Nustep L5 x 6 min UEs/LEs Side step green TB x3 at counter Forwards/Backwards monster walk green TB x3 at counter Palloff press red TB doubled 2x10x5 sec Shoulder ext red TB 2x10x5 sec                                                                                                                               PATIENT EDUCATION:  Education details: Exam findings, POC, aquatics, initial HEP Person educated: Patient Education method: Programmer, multimediaxplanation, Demonstration, and Handouts Education comprehension: verbalized understanding, returned demonstration, and needs further education  HOME EXERCISE PROGRAM: Access Code: [email or web address] URL: [email or web address] Date: 05/02/2022 Prepared by: Vernon PreyGellen April Kirstie PeriMarie Nonato  Exercises - Seated Figure 4 Piriformis Stretch  - 1 x daily - 7 x weekly - 2 sets - 30 sec hold - Seated Piriformis Stretch  - 1 x daily - 7 x weekly - 2 sets - 30 sec hold - Standing 'L' Stretch at Counter  - 1 x daily - 7 x weekly - 2 sets - 30 sec hold - Clamshell  - 1 x daily - 7 x weekly - 2 sets - 10 reps - Supine Posterior Pelvic Tilt  - 1 x daily - 7 x weekly - 2 sets - 10 reps - Supine March with Posterior Pelvic Tilt  - 1 x daily - 7 x weekly - 2 sets -  10 reps - Supine Bridge  - 1 x daily - 7 x weekly - 2 sets - 10 reps -  Standing Anti-Rotation Press with Anchored Resistance  - 1 x daily - 7 x weekly - 2 sets - 10 reps - 5 sec hold - Standing Bilateral Low Shoulder Row with Anchored Resistance  - 1 x daily - 7 x weekly - 2 sets - 10 reps  ASSESSMENT:  CLINICAL IMPRESSION: Patient presents to PT reporting increased lower back pain today due to lots of gardening over the weekend. Session today continued to focus on core and proximal hip strengthening. Patient was able to tolerate all prescribed exercises with no adverse effects. Patient continues to benefit from skilled PT services and should be progressed as able to improve functional independence.     OBJECTIVE IMPAIRMENTS: Abnormal gait, decreased balance, decreased endurance, decreased mobility, difficulty walking, decreased strength, increased fascial restrictions, increased muscle spasms, impaired flexibility, impaired UE functional use, improper body mechanics, postural dysfunction, and pain.   ACTIVITY LIMITATIONS: carrying, lifting, bending, standing, sleeping, stairs, transfers, bed mobility, toileting, dressing, hygiene/grooming, locomotion level, and caring for others  PARTICIPATION LIMITATIONS: meal prep, cleaning, laundry, shopping, community activity, occupation, and yard work  PERSONAL FACTORS: Fitness, Past/current experiences, and Time since onset of injury/illness/exacerbation are also affecting patient's functional outcome.   REHAB POTENTIAL: Good  CLINICAL DECISION MAKING: Evolving/moderate complexity  EVALUATION COMPLEXITY: Moderate   GOALS: Goals reviewed with patient? Yes  SHORT TERM GOALS: Target date: 05/09/2022   Pt will be ind with initial HEP Baseline: Goal status: MET  2.  Pt will demo at least 10 deg improvement with hamstring length in SLR for decreased R LE neural tension Baseline:  Goal status: MET  3.  Pt will be ind with maintaining proper lifting mechanics for work and home tasks Baseline:  Goal status: IN  PROGRESS    LONG TERM GOALS: Target date: 06/06/2022   Pt will be ind with management and progression of HEP Baseline:  Goal status: INITIAL  2.  Pt will maintain SLS for at least 37 sec R&L to be within age norms to demo improved LE and core stability Baseline:  Goal status: INITIAL  3.  Pt will report reduced pain by >/=50% Baseline:  Goal status: INITIAL  4.  Pt will have 10% improvement in lumbar ROM with </=2/10 pain Baseline:  Goal status: INITIAL  5.  Pt will have improved FOTO to >/=54 Baseline:  Goal status: INITIAL  PLAN:  PT FREQUENCY: 2x/week  PT DURATION: 8 weeks  PLANNED INTERVENTIONS: Therapeutic exercises, Therapeutic activity, Neuromuscular re-education, Balance training, Gait training, Patient/Family education, Self Care, Joint mobilization, Stair training, Aquatic Therapy, Dry Needling, Electrical stimulation, Spinal manipulation, Spinal mobilization, Cryotherapy, Moist heat, scar mobilization, Taping, Traction, Ionotophoresis 4mg /ml Dexamethasone, Manual therapy, and Re-evaluation.  PLAN FOR NEXT SESSION: Assess response to HEP. Continue core and hip strengthening. Work on Architectural technologist.    Berta Minor, PTA 05/24/2022, 3:27 PM

## 2022-05-24 NOTE — Telephone Encounter (Signed)
NIA denied auth stating notes does not include notes and exam findings related to back pain; intensity; and nerve problems  Notes that say the date the back pain started  Notes of non-operative therapy tried within the last 3 months  Notes of any prior treatments to manage pain; notes should include: the date of the procedure; the spinal region; and how much the therapy reduced pain and improved function.

## 2022-05-25 ENCOUNTER — Other Ambulatory Visit: Payer: Self-pay | Admitting: Family Medicine

## 2022-05-25 DIAGNOSIS — R600 Localized edema: Secondary | ICD-10-CM

## 2022-05-26 ENCOUNTER — Ambulatory Visit: Payer: Medicaid Other | Admitting: Physical Therapy

## 2022-05-27 ENCOUNTER — Encounter: Payer: Medicaid Other | Attending: Physical Medicine & Rehabilitation | Admitting: Physical Medicine & Rehabilitation

## 2022-05-27 ENCOUNTER — Encounter: Payer: Self-pay | Admitting: Physical Medicine & Rehabilitation

## 2022-05-27 VITALS — BP 148/86 | HR 88 | Ht 65.0 in | Wt 200.0 lb

## 2022-05-27 DIAGNOSIS — M5136 Other intervertebral disc degeneration, lumbar region: Secondary | ICD-10-CM

## 2022-05-27 MED ORDER — TRAMADOL HCL 50 MG PO TABS: 50.0000 mg | ORAL_TABLET | Freq: Every day | ORAL | 0 refills | Status: DC

## 2022-05-27 NOTE — Progress Notes (Signed)
Subjective:    Patient ID: Stacy Barrett, female    DOB: 1966/01/26, 57 y.o.   MRN: [id number]  HPI 57 year old female with chronic low back pain radiating into the right side.  She has tried nonsteroidal anti-inflammatories which cause stomach upset. PT was helpful after 3-4th visit, but still having RIght thigh pain with activity.  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 Although tingling has improved, she still has numbness in the anterior and lateral thigh on the right side only.   CLINICAL DATA:  Low back pain, radiating down right leg   EXAM: MRI LUMBAR SPINE WITHOUT CONTRAST   TECHNIQUE: Multiplanar, multisequence MR imaging of the lumbar spine was performed. No intravenous contrast was administered.   COMPARISON:  None Available.   FINDINGS: Segmentation: 5 lumbar type vertebral bodies. The lowest fully formed disc space is labeled L5-S1.   Alignment: Mild dextrocurvature. 2 mm retrolisthesis of L2 on L3, L3 on L4, and L4 on L5. 4 mm anterolisthesis L5 on S1 (grade 1).   Vertebrae: No acute fracture, suspicious osseous lesion, or evidence of discitis.   Conus medullaris and cauda equina: Conus extends to the L1 level. Conus and cauda equina appear normal.   Paraspinal and other soft tissues: Left renal cysts, for which no follow-up is currently indicated. No lymphadenopathy.   Disc levels:   T11-T12: Minimal disc bulge and small right foraminal protrusion. No spinal canal stenosis or neural foraminal narrowing.   T12-L1: No significant disc bulge. No spinal canal stenosis or neural foraminal narrowing.   L1-L2: Minimal disc bulge with right foraminal protrusion. Narrowing of the right lateral recess. No spinal canal stenosis or neural foraminal narrowing.   L2-L3: Trace retrolisthesis with mild disc bulge with superimposed left subarticular disc extrusion with 9 mm of caudal  migration. Narrowing of the left lateral recess. Mild facet arthropathy. No spinal canal stenosis or neural foraminal narrowing.   L3-L4: Trace retrolisthesis and minimal disc bulge, eccentric to the right, which may contact the exiting right L3 nerve. No spinal canal stenosis or neural foraminal narrowing.   L4-L5: Trace retrolisthesis with mild disc bulge, with superimposed left paracentral disc extrusion with 4 mm of caudal migration. No spinal canal stenosis or neural foraminal narrowing.   L5-S1: Grade 1 anterolisthesis with disc unroofing and mild disc bulge. No spinal canal stenosis. Mild left neural foraminal narrowing.   IMPRESSION: 1. L3-L4 right eccentric disc bulge may contact the exiting right L3 nerve. 2. L5-S1 mild left neural foraminal narrowing. 3. Narrowing of the right lateral recess at L1-L2 and left lateral recess at L2-L3 could affect the descending right L2 and left L3 nerves, respectively.     Electronically Signed   By: Wiliam Ke M.D.   On: 02/01/2022 01:30 Pain Inventory Average Pain 4 Pain Right Now 4 My pain is constant and aching  In the last 24 hours, has pain interfered with the following? General activity 10 Relation with others 10 Enjoyment of life 10 What TIME of day is your pain at its worst? evening and night Sleep (in general) Poor  Pain is worse with: walking, bending, sitting, inactivity, standing, and some activites Pain improves with: heat/ice, therapy/exercise, and medication Relief from Meds: 5  Family History  Problem Relation Age of Onset   CAD Mother    Social History   Socioeconomic History   Marital status: Divorced    Spouse name: Not  on file   Number of children: Not on file   Years of education: Not on file   Highest education level: Not on file  Occupational History   Not on file  Tobacco Use   Smoking status: Every Day    Packs/day: .5    Types: Cigarettes   Smokeless tobacco: Never  Vaping Use    Vaping Use: Former   Substances: THC, CBD  Substance and Sexual Activity   Alcohol use: Yes    Alcohol/week: 1.0 standard drink of alcohol    Types: 1 Glasses of wine per week    Comment: "hardly ever"   Drug use: Yes    Frequency: 2.0 times per week    Types: Marijuana   Sexual activity: Not on file  Other Topics Concern   Not on file  Social History Narrative   Not on file   Social Determinants of Health   Financial Resource Strain: Not on file  Food Insecurity: Not on file  Transportation Needs: Not on file  Physical Activity: Not on file  Stress: Not on file  Social Connections: Not on file   History reviewed. No pertinent surgical history. History reviewed. No pertinent surgical history. Past Medical History:  Diagnosis Date   Anxiety    Depression    Edema    Hypertension    Lumbar radiculopathy    Pituitary adenoma    Vertebral fracture    Ht 5\' 5"  (1.651 m)   Wt 200 lb (90.7 kg)   BMI 33.28 kg/m   Opioid Risk Score:   Fall Risk Score:  `1  Depression screen [facility] 2/9     04/28/2022    2:31 PM 03/25/2022    2:20 PM 01/27/2022   11:34 AM 01/25/2022    4:24 PM 10/25/2021    2:51 PM 10/11/2021   10:35 AM 08/02/2021    2:42 PM  Depression screen PHQ 2/9  Decreased Interest 1 1  1  1 2   Down, Depressed, Hopeless 1 1  1  1 2   PHQ - 2 Score 2 2  2  2 4   Altered sleeping  0  0  0 3  Tired, decreased energy  2  1  1 3   Change in appetite  0  0  0 2  Feeling bad or failure about yourself   1  1  1 3   Trouble concentrating  1  1  1 2   Moving slowly or fidgety/restless  0  0  0 0  Suicidal thoughts  1  1  1 2   PHQ-9 Score  7  6  6 19   Difficult doing work/chores            Information is confidential and restricted. Go to Review Flowsheets to unlock data.      Review of Systems  Musculoskeletal:  Positive for back pain.  All other systems reviewed and are negative.     Objective:   Physical Exam  Right lateral lean and twist is tight no problem  with lumbar flexion or extension. Negative straight leg raise bilaterally Sensation reduced right anterior thigh.  Motor strength is 5/5 bilateral hip flexor knee extensor ankle dorsiflexor.  Ambulates without assistive device no evidence of toe drag or knee instability General no acute distress Mood and affect are appropriate      Assessment & Plan:   #1.  Right L3 radiculopathy chronic, partially responsive to physical therapy but still has activity related pain which limits her  activity.  Would continue with a few more therapy sessions but I think at this point she will be a good candidate for right L3-L4 transforaminal lumbar epidural steroid injection.  She failed nonsteroidal anti-inflammatories. I will see her back for the injection.  Will use dexamethasone 10 mg. Takes tramadol every other day will give her prescription for 30 tablets.  We discussed that after the epidural we should be able to discontinue this med

## 2022-06-02 ENCOUNTER — Telehealth (HOSPITAL_COMMUNITY): Payer: Self-pay | Admitting: *Deleted

## 2022-06-02 NOTE — Telephone Encounter (Signed)
Prior authorization for Abilify submitted online with cover my meds. Approved from 06/02/22-06/02/23. ID number [id number]. Ticket number [id number]. Notified pharmacy.

## 2022-06-09 ENCOUNTER — Ambulatory Visit: Payer: Medicaid Other | Admitting: Family Medicine

## 2022-06-14 ENCOUNTER — Ambulatory Visit: Payer: Medicaid Other

## 2022-06-23 ENCOUNTER — Encounter: Payer: Medicaid Other | Attending: Physical Medicine & Rehabilitation | Admitting: Physical Medicine & Rehabilitation

## 2022-06-23 DIAGNOSIS — M5136 Other intervertebral disc degeneration, lumbar region: Secondary | ICD-10-CM | POA: Insufficient documentation

## 2022-06-27 NOTE — Therapy (Unsigned)
OUTPATIENT PHYSICAL THERAPY TREATMENT   Patient Name: Stacy Barrett MRN: [id number] DOB:03-03-1965, 57 y.o., female Today's Date: 06/29/2022  END OF SESSION:  PT End of Session - 06/29/22 1407     Visit Number 8    Number of Visits 15    Date for PT Re-Evaluation 08/24/22    Authorization Type Medicaid    Authorization Time Period --    Authorization - Number of Visits --    PT Start Time 1407    PT Stop Time 1445    PT Time Calculation (min) 38 min    Activity Tolerance Patient tolerated treatment well    Behavior During Therapy WFL for tasks assessed/performed                 Past Medical History:  Diagnosis Date   Anxiety    Depression    Edema    Hypertension    Lumbar radiculopathy    Pituitary adenoma (HCC)    Vertebral fracture    History reviewed. No pertinent surgical history. Patient Active Problem List   Diagnosis Date Noted   MDD (major depressive disorder), recurrent episode, moderate (HCC) 01/27/2021   GAD (generalized anxiety disorder) 01/27/2021   IBS (irritable bowel syndrome) 05/19/2017   Tobacco abuse 05/19/2017   Low back pain 06/13/2016   Hyperlipidemia 11/02/2015   Panic disorder with agoraphobia 10/08/2013   Panic attacks 10/07/2013   Major depression, recurrent (HCC) 10/07/2013   Anxiety 10/06/2013   Depression 10/06/2013   Vitamin D insufficiency 06/26/2013   Hypertension 06/22/2013   IUD (intrauterine device) in place 06/22/2013   Cervical cancer screening 06/22/2013    PCP: Hoy Register  REFERRING PROVIDER: Erick Colace, MD  REFERRING DIAG: M43.16 (ICD-10-CM) - Spondylolisthesis, lumbar region  Rationale for Evaluation and Treatment: Rehabilitation  THERAPY DIAG:  Muscle weakness (generalized)  Other low back pain  Sciatica, right side  ONSET DATE: ~15 years ago  SUBJECTIVE:                                                                                                                                                                                            SUBJECTIVE STATEMENT: Returns to [facility] with continued c/o BLE pain as well as issues with decreased L DF.  Symptoms worse with prolonged standing and flexion tasks.  Wearing L ankle brace due to recent sprain.  Recommended to undergo ESI to relieve symptoms but initially denied.  Feels PT to this point has helped greatly.  PERTINENT HISTORY:  Chronic back pain From eval: Pt goes out of town once a week to help a friend -- helped with  yard work and is hurting today. 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 Pt reports leg weakness and pain. Pt states pain of bending forward has been getting worse. Notes it hurts to do dishes and laundry. By end of day pain is like a hornet's sting in R buttock, left one starts up to. Pt notes she is in a cycle of gaining weight. Pain is best in the morning 3/10. At most 8/10 by the end of day (around 5pm). States she has to stretch to keep pain at bay during the day.   PAIN:  Are you having pain? Yes: NPRS scale: 4/10 Pain location: Bilat buttocks (R worse than L) Pain description: Throbbing, dull, achy Aggravating factors: Bending forward Relieving factors: Pain medication, stretching  PRECAUTIONS: Fall  WEIGHT BEARING RESTRICTIONS: No  FALLS:  Has patient fallen in last 6 months? Yes. 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 Works at [employer] and Sat -- mostly standing job but also carrying buckets and sweeping Enjoys line dancing  PLOF: Independent  PATIENT GOALS: Improve mobility  NEXT MD VISIT: n/a  OBJECTIVE:   DIAGNOSTIC FINDINGS:  01/30/22 MRI  IMPRESSION: 1. L3-L4 right eccentric disc bulge may contact the exiting right L3 nerve. 2. L5-S1 mild left neural foraminal narrowing. 3. Narrowing of the right lateral recess at L1-L2 and left lateral recess at L2-L3 could affect the descending right L2 and left L3 nerves, respectively.  PATIENT SURVEYS:  FOTO 44; predicted 54  MUSCLE LENGTH: Hamstrings: Right 90 deg; Left 90 deg Thomas test: negative B  POSTURE: decreased lumbar lordosis  PALPATION: TTP bilat glutes and piriformis (worse R than L)  LUMBAR ROM:   AROM eval 06/29/22  Flexion 60% 75%  Extension 25%* 25%  Right lateral flexion 50%* 50%  Left lateral flexion 60%* 50%  Right rotation 25% 10%  Left rotation 25% 25%   (Soft tissue restrictions noted throughout)   LOWER EXTREMITY ROM:   WFL throughout  Active  Right eval Left eval  Hip flexion    Hip extension    Hip abduction    Hip adduction    Hip internal rotation    Hip external rotation    Knee flexion    Knee extension    Ankle dorsiflexion    Ankle plantarflexion    Ankle inversion    Ankle eversion     (Blank rows = not tested)  LOWER EXTREMITY MMT:    MMT Right eval Left eval  Hip flexion 4 4  Hip extension 4 3  Hip abduction 4 4  Hip adduction    Hip internal rotation    Hip external rotation    Knee flexion 4 4+  Knee extension 4+ 4+  Ankle dorsiflexion 4 3  Ankle plantarflexion 4 3  Ankle inversion    Ankle eversion     (Blank rows = not tested)  LUMBAR SPECIAL TESTS:  Slump test negative B  FUNCTIONAL TESTS:  5 times sit to stand: 10.03 sec  ; 06/29/22 10s arms crossed L SLS: 22.39 sec; R SLS: 11.96 sec (with trendelenburg)   GAIT: Distance walked: 100' Assistive device utilized: None Level of assistance: Complete  Independence Comments: Mildly antalgic, L ankle brace today  TODAY'S TREATMENT:   [facility] Adult PT Treatment:                                                DATE: 06/29/22 Eval and re-assessment   [facility]  Adult PT Treatment:                                                DATE: 05/24/22 Therapeutic Exercise: Nustep L6 x 6 min UEs/LEs Side step green TB x3 at counter Standing hip ext at counter GTB at ankles x10 BIL Palloff press 7# 2x10 BIL Shoulder ext 2 7# cables 2x10 - cues for core activation  Supine PPT 5" hold x10 Bridges 2x10 Supine figure 4 piriformis stretch 2x30" BIL push/pull Supine hamstring stretch x30" BIL  [facility] Adult PT Treatment:                                                DATE: 05/12/22 Therapeutic Exercise: Nustep L6 x 3 min UEs/LEs Side step green TB x3 at counter Forwards/Backwards monster walk green TB x2 at counter Palloff press 7# 2x10 BIL Shoulder ext 2 3# cables 2x10 - cues for core activation  Supine PPT 5" hold 2x10   [facility] Adult PT Treatment:                                                DATE: 05/09/22 Therapeutic Exercise: Nustep L5 x 6 min UEs/LEs Side step green TB x3 at counter Forwards/Backwards monster walk green TB x3 at counter Palloff press red TB doubled 2x10x5 sec Shoulder ext red TB 2x10x5 sec                                                                                                                               PATIENT EDUCATION:  Education details: Exam findings, POC, aquatics, initial HEP Person educated: Patient Education method: Programmer, multimedia, Demonstration, and Handouts Education comprehension: verbalized understanding, returned demonstration, and needs further education  HOME EXERCISE PROGRAM: Access Code: [email or web address] URL: [email or web address] Date: 05/02/2022 Prepared by: Vernon Prey April Kirstie Peri  Exercises - Seated Figure 4 Piriformis Stretch  - 1 x daily - 7 x weekly - 2 sets - 30 sec hold - Seated Piriformis Stretch  - 1 x daily - 7 x weekly - 2 sets - 30 sec hold - Standing 'L' Stretch at [employer]  -  1 x daily - 7 x weekly - 2 sets - 30 sec hold - Clamshell  - 1 x daily - 7 x weekly - 2 sets - 10 reps -  Supine Posterior Pelvic Tilt  - 1 x daily - 7 x weekly - 2 sets - 10 reps - Supine March with Posterior Pelvic Tilt  - 1 x daily - 7 x weekly - 2 sets - 10 reps - Supine Bridge  - 1 x daily - 7 x weekly - 2 sets - 10 reps - Standing Anti-Rotation Press with Anchored Resistance  - 1 x daily - 7 x weekly - 2 sets - 10 reps - 5 sec hold - Standing Bilateral Low Shoulder Row with Anchored Resistance  - 1 x daily - 7 x weekly - 2 sets - 10 reps  ASSESSMENT:  CLINICAL IMPRESSION: Patient returns to outpatient physical therapy to resume plan of care to address low back pain.  Since last visit patient reports symptoms have greatly improved.  She continues to experience pain with prolonged positioning or when performing prolonged activities.  Today's assessment found good range of motion throughout bilateral lower extremities including hips.  No hamstring tightness was detected in all neuro tension signs were unremarkable.  Patient does show marked restrictions in trunk mobility demonstrating a soft tissue restriction pattern is mobility limited throughout.  Spring testing of lumbar spine elicited symptoms and found decreased intersegmental mobility.  Soft tissue restrictions as well as myofascial limitations were noted throughout lumbosacral area.  Taut bands were also detected throughout paraspinals right being greater than left.  Patient has been recommended to undergo epidural steroid injections however initial claim has been denied due to lack of conservative treatment documentation to date.  Patient is a good candidate to resume physical therapy and may benefit from trigger point dry needling to address muscular tension.    OBJECTIVE IMPAIRMENTS: Abnormal gait, decreased balance, decreased endurance, decreased mobility, difficulty walking, decreased strength, increased fascial restrictions, increased muscle spasms, impaired flexibility, impaired UE functional use, improper body mechanics, postural  dysfunction, and pain.   ACTIVITY LIMITATIONS: carrying, lifting, bending, standing, sleeping, stairs, transfers, bed mobility, toileting, dressing, hygiene/grooming, locomotion level, and caring for others  PARTICIPATION LIMITATIONS: meal prep, cleaning, laundry, shopping, community activity, occupation, and yard work  PERSONAL FACTORS: Fitness, Past/current experiences, and Time since onset of injury/illness/exacerbation are also affecting patient's functional outcome.   REHAB POTENTIAL: Good  CLINICAL DECISION MAKING: Evolving/moderate complexity  EVALUATION COMPLEXITY: Moderate   GOALS: Goals reviewed with patient? Yes  SHORT TERM GOALS: Target date: 07/20/2022     Pt will be ind with initial HEP Baseline: Goal status: MET  2.  Pt will be ind with maintaining proper lifting mechanics for work and home tasks Baseline:  Goal status: IN PROGRESS    LONG TERM GOALS: Target date: 08/10/2022   Pt will be ind with management and progression of HEP Baseline:  Goal status: INITIAL  2.  Pt will maintain SLS for at least 37 sec R&L to be within age norms to demo improved LE and core stability Baseline:  Goal status: INITIAL  3.  Pt will report reduced pain by >/=50% Baseline:  Goal status: INITIAL  4.  Pt will have 10% improvement in lumbar ROM with </=2/10 pain Baseline:  Goal status: INITIAL  5.  Pt will have improved FOTO to >/=54 Baseline:  Goal status: INITIAL  PLAN:  PT FREQUENCY: 2x/week  PT DURATION: 8 weeks  PLANNED INTERVENTIONS: Therapeutic exercises,  Therapeutic activity, Neuromuscular re-education, Balance training, Gait training, Patient/Family education, Self Care, Joint mobilization, Stair training, Aquatic Therapy, Dry Needling, Electrical stimulation, Spinal manipulation, Spinal mobilization, Cryotherapy, Moist heat, scar mobilization, Taping, Traction, Ionotophoresis 4mg /ml Dexamethasone, Manual therapy, and Re-evaluation.  PLAN FOR NEXT SESSION:  Assess response to HEP. Continue core and hip strengthening. Work on Architectural technologist.    Hildred Laser, PT 06/29/2022, 4:09 PM  Check all possible CPT codes: 16109 - PT Re-evaluation, 97110- Therapeutic Exercise, [phone]- Neuro Re-education, [phone] - Gait Training, [phone] - Manual Therapy, [phone] - Therapeutic Activities, and [phone] - Self Care    Check all conditions that are expected to impact treatment: {Conditions expected to impact treatment:Musculoskeletal disorders and Contractures, spasticity or fracture relevant to requested treatment   If treatment provided at initial evaluation, no treatment charged due to lack of authorization.

## 2022-06-29 ENCOUNTER — Ambulatory Visit: Payer: Medicaid Other | Attending: Physical Medicine & Rehabilitation

## 2022-06-29 ENCOUNTER — Other Ambulatory Visit: Payer: Self-pay | Admitting: Family Medicine

## 2022-06-29 DIAGNOSIS — M6281 Muscle weakness (generalized): Secondary | ICD-10-CM | POA: Insufficient documentation

## 2022-06-29 DIAGNOSIS — E7849 Other hyperlipidemia: Secondary | ICD-10-CM

## 2022-06-29 DIAGNOSIS — M5459 Other low back pain: Secondary | ICD-10-CM | POA: Insufficient documentation

## 2022-06-29 DIAGNOSIS — M5136 Other intervertebral disc degeneration, lumbar region: Secondary | ICD-10-CM | POA: Diagnosis not present

## 2022-06-29 DIAGNOSIS — R600 Localized edema: Secondary | ICD-10-CM

## 2022-06-29 DIAGNOSIS — M5431 Sciatica, right side: Secondary | ICD-10-CM | POA: Insufficient documentation

## 2022-06-29 NOTE — Addendum Note (Signed)
Addended by: Hildred Laser on: 06/29/2022 04:11 PM   Modules accepted: Orders

## 2022-06-29 NOTE — Telephone Encounter (Signed)
Unable to refill per protocol, appointment needed.   Requested Prescriptions  Pending Prescriptions Disp Refills   atorvastatin (LIPITOR) 20 MG tablet [Pharmacy Med Name: Atorvastatin Calcium 20MG  TABS] 30 tablet     Sig: TAKE 1 TABLET (20 MG TOTAL) BY MOUTH DAILY.     Cardiovascular:  Antilipid - Statins Failed - 06/29/2022 11:33 AM      Failed - Lipid Panel in normal range within the last 12 months    Cholesterol, Total  Date Value Ref Range Status  05/22/2017 155 100 - 199 mg/dL Final   LDL Calculated  Date Value Ref Range Status  05/22/2017 83 0 - 99 mg/dL Final   HDL  Date Value Ref Range Status  05/22/2017 47 >39 mg/dL Final   Triglycerides  Date Value Ref Range Status  05/22/2017 124 0 - 149 mg/dL Final         Passed - Patient is not pregnant      Passed - Valid encounter within last 12 months    Recent Outpatient Visits           5 months ago Bilateral sciatica   [place] [facility] Hoy Register, MD   8 months ago Annual physical exam   [facility] Hoy Register, MD   11 months ago Bilateral sciatica   [facility] Hoy Register, MD   2 years ago Pedal edema   [place] [facility] Hoy Register, MD   2 years ago Annual physical exam   [facility] [place], Odette Horns, MD               furosemide (LASIX) 20 MG tablet [Pharmacy Med Name: Furosemide 20MG  TABS] 90 tablet     Sig: TAKE 1 TABLET (20 MG TOTAL) BY MOUTH DAILY AS NEEDED FOR EDEMA.     Cardiovascular:  Diuretics - Loop Failed - 06/29/2022 11:33 AM      Failed - K in normal range and within 180 days    Potassium  Date Value Ref Range Status  08/02/2021 4.5 3.5 - 5.2 mmol/L Final         Failed - Ca in normal range and within 180 days    Calcium  Date Value Ref Range Status  08/02/2021 10.6 (H) 8.7 - 10.2 mg/dL Final          Failed - Na in normal range and within 180 days    Sodium  Date Value Ref Range Status  08/02/2021 142 134 - 144 mmol/L Final         Failed - Cr in normal range and within 180 days    Creat  Date Value Ref Range Status  10/28/2015 0.64 0.50 - 1.05 mg/dL Final    Comment:      For patients > or = 58 years of age: The upper reference limit for Creatinine is approximately 13% higher for people identified as African-American.      Creatinine, Ser  Date Value Ref Range Status  08/02/2021 1.06 (H) 0.57 - 1.00 mg/dL Final         Failed - Cl in normal range and within 180 days    Chloride  Date Value Ref Range Status  08/02/2021 102 96 - 106 mmol/L Final         Failed - Mg Level in normal range and within 180 days    No  results found for: "MG"       Failed - Last BP in normal range    BP Readings from Last 1 Encounters:  05/27/22 (!) 148/86         Passed - Valid encounter within last 6 months    Recent Outpatient Visits           5 months ago Bilateral sciatica   [place] [facility] [place], Odette Horns, MD   8 months ago Annual physical exam   [facility] Hoy Register, MD   11 months ago Bilateral sciatica   [place] [facility] Hoy Register, MD   2 years ago Pedal edema   [place] [facility] Hoy Register, MD   2 years ago Annual physical exam   [facility] Hoy Register, MD

## 2022-07-06 ENCOUNTER — Other Ambulatory Visit: Payer: Self-pay | Admitting: Physical Medicine & Rehabilitation

## 2022-07-06 ENCOUNTER — Encounter (HOSPITAL_COMMUNITY): Payer: Self-pay | Admitting: Psychiatry

## 2022-07-06 ENCOUNTER — Telehealth (INDEPENDENT_AMBULATORY_CARE_PROVIDER_SITE_OTHER): Payer: Medicaid Other | Admitting: Psychiatry

## 2022-07-06 DIAGNOSIS — F411 Generalized anxiety disorder: Secondary | ICD-10-CM | POA: Diagnosis not present

## 2022-07-06 DIAGNOSIS — F331 Major depressive disorder, recurrent, moderate: Secondary | ICD-10-CM

## 2022-07-06 MED ORDER — ARIPIPRAZOLE 2 MG PO TABS: 2.0000 mg | ORAL_TABLET | Freq: Every day | ORAL | 3 refills | Status: DC

## 2022-07-06 MED ORDER — GABAPENTIN 300 MG PO CAPS: ORAL_CAPSULE | ORAL | 3 refills | Status: DC

## 2022-07-06 MED ORDER — SERTRALINE HCL 100 MG PO TABS
150.0000 mg | ORAL_TABLET | Freq: Every day | ORAL | 3 refills | Status: DC
Start: 2022-07-06 — End: 2022-09-21

## 2022-07-06 MED ORDER — BUSPIRONE HCL 15 MG PO TABS
15.0000 mg | ORAL_TABLET | Freq: Two times a day (BID) | ORAL | 3 refills | Status: DC
Start: 2022-07-06 — End: 2022-09-21

## 2022-07-06 NOTE — Progress Notes (Signed)
BH MD/PA/NP OP Progress Note Virtual Visit via Video Note  I connected with Stacy Barrett on 07/06/22 at 11:00 AM EDT by a video enabled telemedicine application and verified that I am speaking with the correct person using two identifiers.  Location: Patient: Home Provider: Clinic   I discussed the limitations of evaluation and management by telemedicine and the availability of in person appointments. The patient expressed understanding and agreed to proceed.  I provided 30 minutes of non-face-to-face time during this encounter.    07/06/2022 12:34 PM Stacy Barrett  MRN:  [id number]  Chief Complaint: "I am frustrated with people"  HPI: 57 year old female seen today for follow-up psychiatric evaluation.  She has a psychiatric history of anxiety, depression, panic disorder, and tobacco dependence.  She is currently managed on Abilify 2 mg daily, BuSpar 15 mg twice daily, gabapentin 300 mg 3 times daily, and Zoloft 150 mg daily.  She informed Clinical research associate that that her medications are effective in managing her psychiatric addition.  Today she logged in virtually but her microphone went in and out. She informed Clinical research associate that she that she has been frustrated with people in her life. She informed Clinical research associate that she continues to care for her mother who has dementia. She reports that she is in a constant state of grief. She notes that she is uncertain about her prognosis and is fearful that she will pass. She reports that her mother has no initiative.  She also reports that recently her friend has been lashing out on her.  Patient informed Clinical research associate that this is overwhelming.  She informed Clinical research associate that she does not feel that she has true support.  Provider recommended patient seeing a therapist.  She was agreeable to this.    Today provider conducted a GAD-7 and patient scored an 8, at her last visit she scored a 10.  Provider also conducted PHQ-9 patient scored an 11, at her last visit she scored a 10.  She  reports sleeping 5 to 6 hours nightly.  She reports her appetite is adequate.  Today she denies SI/HI/VAH, mania, paranoia.    Patient informed Clinical research associate that she has been having back pain.  She notes that she manages her back pain with physical therapy.  She also uses mobic and gabapentin to help managing pain.   Patient informed Clinical research associate that she smokes half a pack of cigarettes a day.  At this time she does not wish to restart NicoDerm gum or patches.  Patient notes that the above stressors are situational and request that medications not be adjusted.  No medication changes made today. Patient agreeable to continue medications as prescribed.  No other concerns at this time.  Visit Diagnosis:    ICD-10-CM   1. Moderate episode of recurrent major depressive disorder (HCC)  F33.1 ARIPiprazole (ABILIFY) 2 MG tablet    busPIRone (BUSPAR) 15 MG tablet    gabapentin (NEURONTIN) 300 MG capsule    sertraline (ZOLOFT) 100 MG tablet    2. GAD (generalized anxiety disorder)  F41.1 busPIRone (BUSPAR) 15 MG tablet    gabapentin (NEURONTIN) 300 MG capsule    sertraline (ZOLOFT) 100 MG tablet      Past Psychiatric History: anxiety, depression, panic disorder, and tobacco dependence  Past Medical History:  Past Medical History:  Diagnosis Date   Anxiety    Depression    Edema    Hypertension    Lumbar radiculopathy    Pituitary adenoma (HCC)    Vertebral fracture  No past surgical history on file.  Family Psychiatric History: Na  Family History:  Family History  Problem Relation Age of Onset   CAD Mother     Social History:  Social History   Socioeconomic History   Marital status: Divorced    Spouse name: Not on file   Number of children: Not on file   Years of education: Not on file   Highest education level: Not on file  Occupational History   Not on file  Tobacco Use   Smoking status: Every Day    Packs/day: .5    Types: Cigarettes   Smokeless tobacco: Never  Vaping Use    Vaping Use: Former   Substances: THC, CBD  Substance and Sexual Activity   Alcohol use: Yes    Alcohol/week: 1.0 standard drink of alcohol    Types: 1 Glasses of wine per week    Comment: "hardly ever"   Drug use: Yes    Frequency: 2.0 times per week    Types: Marijuana   Sexual activity: Not on file  Other Topics Concern   Not on file  Social History Narrative   Not on file   Social Determinants of Health   Financial Resource Strain: Not on file  Food Insecurity: Not on file  Transportation Needs: Not on file  Physical Activity: Not on file  Stress: Not on file  Social Connections: Not on file    Allergies:  Allergies  Allergen Reactions   Other Other (See Comments)    MSG -Very intense headaches   Penicillin G Other (See Comments)    Welps   Penicillins Hives, Other (See Comments) and Rash    Welps  Welps  Welps   Prednisone Itching, Rash and Swelling    Swelling of lips and face  Swelling of lips and face  Swelling of lips and face   Sulfa Antibiotics Hives, Itching and Other (See Comments)    Welps   Sulfamethoxazole     Other Reaction(s): Other (See Comments)  Welps   Monosodium Glutamate Other (See Comments) and Swelling   Hydrocodone Swelling    Noted swelling of tongue, knees, fingers and toes.  Transient.  No throat swelling.  Noted swelling of tongue, knees, fingers and toes.  Transient.  No throat swelling.  Noted swelling of tongue, knees, fingers and toes.  Transient.  No throat swelling.    Metabolic Disorder Labs: Lab Results  Component Value Date   HGBA1C 5.7 (H) 10/08/2013   MPG 117 (H) 10/08/2013   Lab Results  Component Value Date   PROLACTIN 37.5 (H) 10/28/2015   PROLACTIN 36.8 07/18/2014   Lab Results  Component Value Date   CHOL 155 05/22/2017   TRIG 124 05/22/2017   HDL 47 05/22/2017   CHOLHDL 3.3 05/22/2017   VLDL 60 (H) 10/28/2015   LDLCALC 83 05/22/2017   LDLCALC 86 06/14/2016   Lab Results  Component Value  Date   TSH 1.270 12/16/2019   TSH 1.130 12/06/2017    Therapeutic Level Labs: Lab Results  Component Value Date   LITHIUM <0.25 (L) 10/03/2013   No results found for: "VALPROATE" No results found for: "CBMZ"  Current Medications: Current Outpatient Medications  Medication Sig Dispense Refill   ALPRAZolam (XANAX) 0.5 MG tablet Take by mouth.     ARIPiprazole (ABILIFY) 2 MG tablet Take 1 tablet (2 mg total) by mouth daily. 30 tablet 3   atorvastatin (LIPITOR) 20 MG tablet TAKE 1 TABLET (20 MG TOTAL)  BY MOUTH DAILY. 90 tablet 0   busPIRone (BUSPAR) 10 MG tablet Take by mouth.     busPIRone (BUSPAR) 15 MG tablet Take 1 tablet (15 mg total) by mouth 2 (two) times daily. 30 tablet 3   cetirizine (ZYRTEC) 10 MG tablet Take 1 tablet (10 mg total) by mouth daily. 90 tablet 3   clonazePAM (KLONOPIN) 0.5 MG tablet Take by mouth.     fluticasone (FLONASE) 50 MCG/ACT nasal spray Place 2 sprays into both nostrils daily. 48 g 3   furosemide (LASIX) 20 MG tablet TAKE 1 TABLET (20 MG TOTAL) BY MOUTH DAILY AS NEEDED FOR EDEMA. 30 tablet 0   gabapentin (NEURONTIN) 100 MG capsule Take by mouth.     gabapentin (NEURONTIN) 300 MG capsule TAKE 1 CAPSULE BY MOUTH THREE TIMES A DAY 90 capsule 3   hydrochlorothiazide (HYDRODIURIL) 25 MG tablet Take 1 tablet by mouth daily.     lisinopril (ZESTRIL) 10 MG tablet Take 1 tablet (10 mg total) by mouth daily. 30 tablet 6   nicotine (NICODERM CQ - DOSED IN MG/24 HOURS) 14 mg/24hr patch Place onto the skin.     sertraline (ZOLOFT) 100 MG tablet Take 1.5 tablets (150 mg total) by mouth daily. 45 tablet 3   traMADol (ULTRAM) 50 MG tablet Take 1 tablet (50 mg total) by mouth daily. 30 tablet 0   Vitamin D, Ergocalciferol, (DRISDOL) 1.25 MG (50000 UNIT) CAPS capsule Take 1 capsule by mouth every 7 (seven) days. 16 capsule 0   No current facility-administered medications for this visit.     Musculoskeletal: Strength & Muscle Tone:  Unable to assess, camera off,   Gait & Station:  Unable to assess, camera off,  Patient leans: N/A  Psychiatric Specialty Exam: Review of Systems  There were no vitals taken for this visit.There is no height or weight on file to calculate BMI.  General Appearance:  Unable to assess, camera off,   Eye Contact:   Unable to assess, camera off,   Speech:  Clear and Coherent and Normal Rate  Volume:  Normal  Mood:  Euthymic  Affect:  Appropriate, Congruent, and Tearful  Thought Process:  Coherent, Goal Directed, and Linear  Orientation:  Full (Time, Place, and Person)  Thought Content: WDL and Logical   Suicidal Thoughts:  Yes.  without intent/plan  Homicidal Thoughts:  No  Memory:  Immediate;   Good Recent;   Good Remote;   Good  Judgement:  Good  Insight:  Good  Psychomotor Activity:   Unable to assess, camera off,   Concentration:  Concentration: Good and Attention Span: Good  Recall:  Good  Fund of Knowledge: Good  Language: Good  Akathisia:   Unable to assess, camera off,   Handed:  Right  AIMS (if indicated): not done  Assets:  Communication Skills Desire for Improvement Financial Resources/Insurance Housing Physical Health  ADL's:  Intact  Cognition: WNL  Sleep:  Good   Screenings: AIMS    Flowsheet Row Video Visit from [facility]/20/2020 in [facility]  AIMS Total Score 0      AUDIT    Flowsheet Row [facility]  Alcohol Use Disorder Identification Test Final Score (AUDIT) 0      GAD-7    Flowsheet Row Video Visit from 07/06/2022 in [facility] Video Visit from 01/27/2022 in [facility] Office Visit from 01/25/2022 in [facility] Video Visit from 10/25/2021 in [facility] Office Visit from 10/11/2021 in [facility]  Total GAD-7 Score 8 10 12 11  6       PHQ2-9    Flowsheet Row Video Visit from 07/06/2022 in [facility] Office Visit from 04[facility]28/2022 in [facility] Office Visit from 03/25/2022 in [facility] Video Visit from 01/27/2022 in [facility] Office Visit from 01/25/2022 in [facility]  PHQ-2 Total Score 2 2 2 2 2   PHQ-9 Total Score 11 -- 7 10 6       Flowsheet Row Video Visit from 07/06/2022 in [facility] Video Visit from 01/27/2022 in [facility] Video Visit from 10/25/2021 in [facility]  C-SSRS RISK CATEGORY Error: Q7 should not be populated when Q6 is No Error: Q7 should not be populated when Q6 is No Error: Q7 should not be populated when Q6 is No        Assessment and Plan: Patient endorses mild anxiety and depression due to situational stressors.  She reports that she is able to cope with this and request that medications not be adjusted.  Patient also informed Clinical research associate that she smokes half a pack of cigarettes a day but at this time not interested in restarting Nicorette gum or patches.  1. Moderate episode of recurrent major depressive disorder (HCC)  Continue- ARIPiprazole (ABILIFY) 2 MG tablet; Take 1 tablet (2 mg total) by mouth daily.  Dispense: 30 tablet; Refill: 3 Continue- busPIRone (BUSPAR) 15 MG tablet; Take 1 tablet (15 mg total) by mouth 2 (two) times daily.  Dispense: 30 tablet; Refill: 3 Continue- gabapentin (NEURONTIN) 300 MG capsule; TAKE 1 CAPSULE BY MOUTH THREE TIMES A DAY  Dispense: 90 capsule; Refill: 3 Continue- sertraline (ZOLOFT) 100 MG tablet; Take 1.5 tablets (150 mg total) by mouth daily.  Dispense: 45 tablet; Refill: 3  2. GAD (generalized anxiety disorder)  Continue- busPIRone (BUSPAR) 15 MG tablet; Take 1 tablet (15 mg total) by mouth 2 (two) times  daily.  Dispense: 30 tablet; Refill: 3 Continue- gabapentin (NEURONTIN) 300 MG capsule; TAKE 1 CAPSULE BY MOUTH THREE TIMES A DAY  Dispense: 90 capsule; Refill: 3 Continue- sertraline (ZOLOFT) 100 MG tablet; Take 1.5 tablets (150 mg total) by mouth daily.  Dispense: 45 tablet; Refill: 3   Follow-up in 3 months Shanna Cisco, NP 07/06/2022, 12:34 PM

## 2022-07-12 ENCOUNTER — Telehealth: Payer: Self-pay

## 2022-07-12 NOTE — Telephone Encounter (Signed)
PA for Tramadol submitted 

## 2022-07-13 ENCOUNTER — Telehealth: Payer: Self-pay | Admitting: *Deleted

## 2022-07-13 NOTE — Telephone Encounter (Signed)
Stacy Barrett (Key: [id number]) - [id number] Status: PA Response - DeniedCreated: May 24th, 2024 [phone]: May 28th, 202 

## 2022-07-13 NOTE — Telephone Encounter (Signed)
Stacy Barrett (Key: [id number]) - [id number] Status: PA Response - DeniedCreated: May 24th, 2024 [phone]: May 28th, 202

## 2022-07-14 NOTE — Telephone Encounter (Signed)
Tramadol was denied.

## 2022-07-18 NOTE — Therapy (Unsigned)
OUTPATIENT PHYSICAL THERAPY TREATMENT   Patient Name: Stacy Barrett MRN: [id number] DOB:1965-04-20, 57 y.o., female Today's Date: 07/19/2022  END OF SESSION:  PT End of Session - 07/19/22 1506     Visit Number 9    Number of Visits 15    Date for PT Re-Evaluation 08/24/22    Authorization Type Medicaid    Authorization Time Period 07/18/22-08/20/22    Authorization - Number of Visits 4    PT Start Time 1505    PT Stop Time 1530    PT Time Calculation (min) 25 min    Activity Tolerance Patient tolerated treatment well    Behavior During Therapy WFL for tasks assessed/performed                  Past Medical History:  Diagnosis Date   Anxiety    Depression    Edema    Hypertension    Lumbar radiculopathy    Pituitary adenoma (HCC)    Vertebral fracture    History reviewed. No pertinent surgical history. Patient Active Problem List   Diagnosis Date Noted   MDD (major depressive disorder), recurrent episode, moderate (HCC) 01/27/2021   GAD (generalized anxiety disorder) 01/27/2021   IBS (irritable bowel syndrome) 05/19/2017   Tobacco abuse 05/19/2017   Low back pain 06/13/2016   Hyperlipidemia 11/02/2015   Panic disorder with agoraphobia 10/08/2013   Panic attacks 10/07/2013   Major depression, recurrent (HCC) 10/07/2013   Anxiety 10/06/2013   Depression 10/06/2013   Vitamin D insufficiency 06/26/2013   Hypertension 06/22/2013   IUD (intrauterine device) in place 06/22/2013   Cervical cancer screening 06/22/2013    PCP: Hoy Register  REFERRING PROVIDER: Erick Colace, MD  REFERRING DIAG: M43.16 (ICD-10-CM) - Spondylolisthesis, lumbar region  Rationale for Evaluation and Treatment: Rehabilitation  THERAPY DIAG:  Muscle weakness (generalized)  Other low back pain  Sciatica, right side  ONSET DATE: ~15 years ago  SUBJECTIVE:                                                                                                                                                                                            SUBJECTIVE STATEMENT:First f/u session since re-assessment.  Increased pain   PERTINENT HISTORY:  Chronic back pain From eval: Pt goes out of town once a week to help a friend -- helped with yard work and is hurting today. 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 Pt reports leg weakness and pain. Pt states pain of bending forward has been getting worse. Notes it  hurts to do dishes and laundry. By end of day pain is like a hornet's sting in R buttock, left one starts up to. Pt notes she is in a cycle of gaining weight. Pain is best in the morning 3/10. At most 8/10 by the end of day (around 5pm). States she has to stretch to keep pain at bay during the day.   PAIN:  Are you having pain? Yes: NPRS scale: 4/10 Pain location: Bilat buttocks (R worse than L) Pain description: Throbbing, dull, achy Aggravating factors: Bending forward Relieving factors: Pain medication, stretching  PRECAUTIONS: Fall  WEIGHT BEARING RESTRICTIONS: No  FALLS:  Has patient fallen in last 6 months? Yes. 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 L3-L4 right eccentric disc bulge may contact the exiting right L3 nerve. 2. L5-S1 mild left neural foraminal narrowing. 3. Narrowing of the right lateral recess at L1-L2 and left lateral recess at L2-L3 could affect the  descending right L2 and left L3 nerves, respectively.  PATIENT SURVEYS:  FOTO 44; predicted 54  MUSCLE LENGTH: Hamstrings: Right 90 deg; Left 90 deg Thomas test: negative B  POSTURE: decreased lumbar lordosis  PALPATION: TTP bilat glutes and piriformis (worse R than L)  LUMBAR ROM:   AROM eval 06/29/22  Flexion 60% 75%  Extension 25%* 25%  Right lateral flexion 50%* 50%  Left lateral flexion 60%* 50%  Right rotation 25% 10%  Left rotation 25% 25%   (Soft tissue restrictions noted throughout)   LOWER EXTREMITY ROM:   WFL throughout  Active  Right eval Left eval  Hip flexion    Hip extension    Hip abduction    Hip adduction    Hip internal rotation    Hip external rotation    Knee flexion    Knee extension    Ankle dorsiflexion    Ankle plantarflexion    Ankle inversion    Ankle eversion     (Blank rows = not tested)  LOWER EXTREMITY MMT:    MMT Right eval Left eval  Hip flexion 4 4  Hip extension 4 3  Hip abduction 4 4  Hip adduction    Hip internal rotation    Hip external rotation    Knee flexion 4 4+  Knee extension 4+ 4+  Ankle dorsiflexion 4 3  Ankle plantarflexion 4 3  Ankle inversion    Ankle eversion     (Blank rows = not tested)  LUMBAR SPECIAL TESTS:  Slump test negative B  FUNCTIONAL TESTS:  5 times sit to stand: 10.03 sec  ; 06/29/22 10s arms crossed L SLS: 22.39 sec; R SLS: 11.96 sec (with trendelenburg)   GAIT: Distance walked: 100' Assistive device utilized: None Level of assistance: Complete Independence Comments: Mildly antalgic, L ankle brace today  TODAY'S TREATMENT:   [facility] Adult PT Treatment:  DATE: 07/19/22 Therapeutic Exercise: Seated hamstring stretch 30s x2 QL stretch 30s x2 B  Curl up L1 15x  PPT 3s 10x PPT with alternating march 10x PPT with alternating OH flexion 10x Bridge with ball 10x Open book    [facility] Adult PT Treatment:                                                 DATE: 06/29/22 Eval and re-assessment   [facility] Adult PT Treatment:                                                DATE: 05/24/22 Therapeutic Exercise: Nustep L6 x 6 min UEs/LEs Side step green TB x3 at counter Standing hip ext at counter GTB at ankles x10 BIL Palloff press 7# 2x10 BIL Shoulder ext 2 7# cables 2x10 - cues for core activation  Supine PPT 5" hold x10 Bridges 2x10 Supine figure 4 piriformis stretch 2x30" BIL push/pull Supine hamstring stretch x30" BIL  [facility] Adult PT Treatment:                                                DATE: 05/12/22 Therapeutic Exercise: Nustep L6 x 3 min UEs/LEs Side step green TB x3 at counter Forwards/Backwards monster walk green TB x2 at counter Palloff press 7# 2x10 BIL Shoulder ext 2 3# cables 2x10 - cues for core activation  Supine PPT 5" hold 2x10   [facility] Adult PT Treatment:                                                DATE: 05/09/22 Therapeutic Exercise: Nustep L5 x 6 min UEs/LEs Side step green TB x3 at counter Forwards/Backwards monster walk green TB x3 at counter Palloff press red TB doubled 2x10x5 sec Shoulder ext red TB 2x10x5 sec                                                                                                                               PATIENT EDUCATION:  Education details: Exam findings, POC, aquatics, initial HEP Person educated: Patient Education method: Programmer, multimedia, Demonstration, and Handouts Education comprehension: verbalized understanding, returned demonstration, and needs further education  HOME EXERCISE PROGRAM: Access Code: [email or web address] URL: [email or web address] Date: 07/19/2022 Prepared by: Gustavus Bryant  Exercises - Supine Posterior Pelvic Tilt  - 1 x daily - 7 x weekly - 2 sets - 10 reps -  Supine March with Posterior Pelvic Tilt  - 1 x daily - 7 x weekly - 2 sets - 10 reps - Supine Bridge  - 1 x daily - 7 x weekly - 2 sets - 10 reps - Sidelying Open Book Thoracic  Lumbar Rotation and Extension  - 1 x daily - 7 x weekly - 2 sets - 10 reps - Curl Up with Reach  - 1 x daily - 7 x weekly - 2 sets - 10 reps  ASSESSMENT:  CLINICAL IMPRESSION: Todays session incorporated stretching tasks to improve lumbar mobility and increase intersegmental motion and provide myofascial stretching.  HEP updated to address progress and remaining deficits.  (Re-assess)Patient returns to outpatient physical therapy to resume plan of care to address low back pain.  Since last visit patient reports symptoms have greatly improved.  She continues to experience pain with prolonged positioning or when performing prolonged activities.  Today's assessment found good range of motion throughout bilateral lower extremities including hips.  No hamstring tightness was detected in all neuro tension signs were unremarkable.  Patient does show marked restrictions in trunk mobility demonstrating a soft tissue restriction pattern is mobility limited throughout.  Spring testing of lumbar spine elicited symptoms and found decreased intersegmental mobility.  Soft tissue restrictions as well as myofascial limitations were noted throughout lumbosacral area.  Taut bands were also detected throughout paraspinals right being greater than left.  Patient has been recommended to undergo epidural steroid injections however initial claim has been denied due to lack of conservative treatment documentation to date.  Patient is a good candidate to resume physical therapy and may benefit from trigger point dry needling to address muscular tension.    OBJECTIVE IMPAIRMENTS: Abnormal gait, decreased balance, decreased endurance, decreased mobility, difficulty walking, decreased strength, increased fascial restrictions, increased muscle spasms, impaired flexibility, impaired UE functional use, improper body mechanics, postural dysfunction, and pain.   ACTIVITY LIMITATIONS: carrying, lifting, bending, standing, sleeping,  stairs, transfers, bed mobility, toileting, dressing, hygiene/grooming, locomotion level, and caring for others  PARTICIPATION LIMITATIONS: meal prep, cleaning, laundry, shopping, community activity, occupation, and yard work  PERSONAL FACTORS: Fitness, Past/current experiences, and Time since onset of injury/illness/exacerbation are also affecting patient's functional outcome.   REHAB POTENTIAL: Good  CLINICAL DECISION MAKING: Evolving/moderate complexity  EVALUATION COMPLEXITY: Moderate   GOALS: Goals reviewed with patient? Yes  SHORT TERM GOALS: Target date: 07/20/2022     Pt will be ind with initial HEP Baseline:[email or web address] Goal status: MET  2.  Pt will be ind with maintaining proper lifting mechanics for work and home tasks Baseline:  Goal status: IN PROGRESS    LONG TERM GOALS: Target date: 08/10/2022   Pt will be ind with management and progression of HEP Baseline:  Goal status: INITIAL  2.  Pt will maintain SLS for at least 37 sec R&L to be within age norms to demo improved LE and core stability Baseline:  Goal status: INITIAL  3.  Pt will report reduced pain by >/=50% Baseline:  Goal status: INITIAL  4.  Pt will have 10% improvement in lumbar ROM with </=2/10 pain Baseline:  Goal status: INITIAL  5.  Pt will have improved FOTO to >/=54 Baseline:  Goal status: INITIAL  PLAN:  PT FREQUENCY: 1-2x/week  PT DURATION: 8 weeks  PLANNED INTERVENTIONS: Therapeutic exercises, Therapeutic activity, Neuromuscular re-education, Balance training, Gait training, Patient/Family education, Self Care, Joint mobilization, Stair training, Aquatic Therapy, Dry Needling, Electrical stimulation, Spinal manipulation, Spinal mobilization, Cryotherapy, Moist heat, scar mobilization,  Taping, Traction, Ionotophoresis 4mg /ml Dexamethasone, Manual therapy, and Re-evaluation.  PLAN FOR NEXT SESSION: Assess response to HEP. Continue core and hip strengthening. Work on Horticulturist, commercial.    Hildred Laser, PT 07/19/2022, 3:39 PM  Check all possible CPT codes: 16109 - PT Re-evaluation, 97110- Therapeutic Exercise, [phone]- Neuro Re-education, [phone] - Gait Training, [phone] - Manual Therapy, [phone] - Therapeutic Activities, and [phone] - Self Care    Check all conditions that are expected to impact treatment: {Conditions expected to impact treatment:Musculoskeletal disorders and Contractures, spasticity or fracture relevant to requested treatment   If treatment provided at initial evaluation, no treatment charged due to lack of authorization.

## 2022-07-19 ENCOUNTER — Ambulatory Visit: Payer: Medicaid Other | Attending: Physical Medicine & Rehabilitation

## 2022-07-19 DIAGNOSIS — M5459 Other low back pain: Secondary | ICD-10-CM | POA: Insufficient documentation

## 2022-07-19 DIAGNOSIS — M6281 Muscle weakness (generalized): Secondary | ICD-10-CM | POA: Insufficient documentation

## 2022-07-19 DIAGNOSIS — M5431 Sciatica, right side: Secondary | ICD-10-CM | POA: Diagnosis present

## 2022-07-20 NOTE — Telephone Encounter (Signed)
Called and left message for patient to call back.

## 2022-07-22 ENCOUNTER — Encounter: Payer: Self-pay | Admitting: Physical Medicine & Rehabilitation

## 2022-07-22 ENCOUNTER — Encounter: Payer: Medicaid Other | Attending: Physical Medicine & Rehabilitation | Admitting: Physical Medicine & Rehabilitation

## 2022-07-22 VITALS — BP 173/118 | HR 98 | Ht 65.0 in | Wt 200.0 lb

## 2022-07-22 DIAGNOSIS — M5416 Radiculopathy, lumbar region: Secondary | ICD-10-CM | POA: Insufficient documentation

## 2022-07-22 NOTE — Progress Notes (Signed)
Subjective:    Patient ID: Stacy Barrett, female    DOB: 12/26/1965, 57 y.o.   MRN: [id number]  HPI  [id number] thigh pain 57 year old female with history of anxiety and hypertension who has had a greater than 18-month history of right thigh pain as well as low back pain.  She has been getting some partial relief with physical therapy although she still has pain that is rated in the moderate range.  She gets some relief with gabapentin and takes 2 tablets in the morning.  She has been taking tramadol however we discussed the need for UDS and she has been getting at least 3 prescriptions from this office.  We discussed that urine toxicology would need to be done as well.  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  Patient very stressed regarding her caregiver role.  She does not feel like she can get away much.  She is able to perform physical therapy and is scheduled for 4 more visits.  MRI LUMBAR SPINE WITHOUT CONTRAST   TECHNIQUE: Multiplanar, multisequence MR imaging of the lumbar spine was performed. No intravenous contrast was administered.   COMPARISON:  None Available.   FINDINGS: Segmentation: 5 lumbar type vertebral bodies. The lowest fully formed disc space is labeled L5-S1.   Alignment: Mild dextrocurvature. 2 mm retrolisthesis of L2 on L3, L3 on L4, and L4 on L5. 4 mm anterolisthesis L5 on S1 (grade 1).   Vertebrae: No acute fracture, suspicious osseous lesion, or evidence of discitis.   Conus medullaris and cauda equina: Conus extends to the L1 level. Conus and cauda equina appear normal.   Paraspinal and other soft tissues: Left renal cysts, for which no follow-up is currently indicated. No lymphadenopathy.   Disc levels:    T11-T12: Minimal disc bulge and small right foraminal protrusion. No spinal canal stenosis or neural foraminal narrowing.   T12-L1: No significant disc bulge. No spinal canal stenosis or neural foraminal narrowing.   L1-L2: Minimal disc bulge with right foraminal protrusion. Narrowing of the right lateral recess. No spinal canal stenosis or neural foraminal narrowing.   L2-L3: Trace retrolisthesis with mild disc bulge with superimposed left subarticular disc extrusion with 9 mm of caudal migration. Narrowing of the left lateral recess. Mild facet arthropathy. No spinal canal stenosis or neural foraminal narrowing.   L3-L4: Trace retrolisthesis and minimal disc bulge, eccentric to the right, which may contact the exiting right L3 nerve. No spinal canal stenosis or neural foraminal narrowing.   L4-L5: Trace retrolisthesis with mild disc bulge, with superimposed left paracentral disc extrusion with 4 mm of caudal migration. No spinal canal stenosis or neural foraminal narrowing.   L5-S1: Grade 1 anterolisthesis with disc unroofing and mild disc bulge. No spinal canal stenosis. Mild left neural foraminal narrowing.   IMPRESSION: 1. L3-L4 right eccentric disc bulge may contact the exiting right L3 nerve. 2. L5-S1 mild left neural foraminal narrowing. 3. Narrowing of the right lateral recess at L1-L2 and left lateral recess at L2-L3 could affect the descending right L2 and left L3 nerves, respectively.     Electronically Signed   By: Wiliam Ke M.D.   On: 02/01/2022 01:30 Pain Inventory Average Pain 7 Pain Right Now 6 My pain is  intermittent, burning, dull, tingling, and aching  In the last 24 hours, has pain interfered with the following? General activity 9 Relation with others 7 Enjoyment of life 9 What TIME of day is your pain at its worst? morning  and evening Sleep (in general) Fair  Pain is worse with: walking, bending, standing, and some activites Pain  improves with: rest, therapy/exercise, and medication Relief from Meds: 5  Family History  Problem Relation Age of Onset   CAD Mother    Social History   Socioeconomic History   Marital status: Divorced    Spouse name: Not on file   Number of children: Not on file   Years of education: Not on file   Highest education level: Not on file  Occupational History   Not on file  Tobacco Use   Smoking status: Every Day    Packs/day: .5    Types: Cigarettes   Smokeless tobacco: Never  Vaping Use   Vaping Use: Former   Substances: THC, CBD  Substance and Sexual Activity   Alcohol use: Yes    Alcohol/week: 1.0 standard drink of alcohol    Types: 1 Glasses of wine per week    Comment: "hardly ever"   Drug use: Yes    Frequency: 2.0 times per week    Types: Marijuana   Sexual activity: Not on file  Other Topics Concern   Not on file  Social History Narrative   Not on file   Social Determinants of Health   Financial Resource Strain: Not on file  Food Insecurity: Not on file  Transportation Needs: Not on file  Physical Activity: Not on file  Stress: Not on file  Social Connections: Not on file   No past surgical history on file. No past surgical history on file. Past Medical History:  Diagnosis Date   Anxiety    Depression    Edema    Hypertension    Lumbar radiculopathy    Pituitary adenoma (HCC)    Vertebral fracture    There were no vitals taken for this visit.  Opioid Risk Score:   Fall Risk Score:  `1  Depression screen [facility] 2/9     07/06/2022   11:18 AM 04/28/2022    2:31 PM 03/25/2022    2:20 PM 01/27/2022   11:34 AM 01/25/2022    4:24 PM 10/25/2021    2:51 PM 10/11/2021   10:35 AM  Depression screen PH[facility] 2/9  Decreased Interest  1 1  1  1   Down, Depressed, Hopeless  1 1  1  1   PHQ - 2 Score  2 2  2  2   Altered sleeping   0  0  0  Tired, decreased energy   2  1  1   Change in appetite   0  0  0  Feeling bad or failure about yourself    1  1  1    Trouble concentrating   1  1  1   Moving slowly or fidgety/restless   0  0  0  Suicidal thoughts   1  1  1   PHQ-9 Score   7  6  6   Difficult doing work/chores            Information is confidential and restricted. Go to Review Flowsheets to unlock data.     Review of Systems  Musculoskeletal:  Positive for back pain.       Rt buttock leg posterior pain  All other systems  reviewed and are negative.      Objective:   Physical Exam Vitals and nursing note reviewed.  Constitutional:      Appearance: She is obese.  HENT:     Head: Normocephalic and atraumatic.  Eyes:     Extraocular Movements: Extraocular movements intact.     Conjunctiva/sclera: Conjunctivae normal.     Pupils: Pupils are equal, round, and reactive to light.  Musculoskeletal:     Comments: Patient with reduced range of motion lumbar spine 50% range with flexion extension lateral bending and rotation. Mild tenderness palpation lumbar paraspinals Moderate tenderness palpation over the greater trochanters bilaterally.  Skin:    General: Skin is warm and dry.  Neurological:     Mental Status: She is alert and oriented to person, place, and time.     Comments: Motor strength is 5/5 bilateral hip flexor knee extensor ankle dorsiflexor Negative straight leg raise bilaterally Decreased sensation right L3 and right L4 dermatomal distribution. Ambulates without assistive device no evidence of toe drag or knee instability   Psychiatric:        Mood and Affect: Mood normal.        Behavior: Behavior normal.           Assessment & Plan:  #1.  Lumbar radiculopathy with reduced sensation in the L3 and L4 dermatomal distribution corresponding with MRI findings of right-sided disc bulge at the L3-4 level impinging upon the right L3 nerve root Has had only partial relief with medication management as well as with physical therapy.  Would recommend transforaminal lumbar epidural steroid injection at the L3-L4 level.    2.  Trochanteric bursitis bilateral continue physical therapy.

## 2022-07-22 NOTE — Patient Instructions (Signed)
Call if you need higher gabapentin dose  Will stop tramadol

## 2022-07-25 NOTE — Therapy (Unsigned)
OUTPATIENT PHYSICAL THERAPY TREATMENT   Patient Name: Stacy Barrett MRN: [id number] DOB:1965-04-20, 57 y.o., female Today's Date: 07/19/2022  END OF SESSION:  PT End of Session - 07/19/22 1506     Visit Number 9    Number of Visits 15    Date for PT Re-Evaluation 08/24/22    Authorization Type Medicaid    Authorization Time Period 07/18/22-08/20/22    Authorization - Number of Visits 4    PT Start Time 1505    PT Stop Time 1530    PT Time Calculation (min) 25 min    Activity Tolerance Patient tolerated treatment well    Behavior During Therapy WFL for tasks assessed/performed                  Past Medical History:  Diagnosis Date   Anxiety    Depression    Edema    Hypertension    Lumbar radiculopathy    Pituitary adenoma (HCC)    Vertebral fracture    History reviewed. No pertinent surgical history. Patient Active Problem List   Diagnosis Date Noted   MDD (major depressive disorder), recurrent episode, moderate (HCC) 01/27/2021   GAD (generalized anxiety disorder) 01/27/2021   IBS (irritable bowel syndrome) 05/19/2017   Tobacco abuse 05/19/2017   Low back pain 06/13/2016   Hyperlipidemia 11/02/2015   Panic disorder with agoraphobia 10/08/2013   Panic attacks 10/07/2013   Major depression, recurrent (HCC) 10/07/2013   Anxiety 10/06/2013   Depression 10/06/2013   Vitamin D insufficiency 06/26/2013   Hypertension 06/22/2013   IUD (intrauterine device) in place 06/22/2013   Cervical cancer screening 06/22/2013    PCP: Hoy Register  REFERRING PROVIDER: Erick Colace, MD  REFERRING DIAG: M43.16 (ICD-10-CM) - Spondylolisthesis, lumbar region  Rationale for Evaluation and Treatment: Rehabilitation  THERAPY DIAG:  Muscle weakness (generalized)  Other low back pain  Sciatica, right side  ONSET DATE: ~15 years ago  SUBJECTIVE:                                                                                                                                                                                            SUBJECTIVE STATEMENT:First f/u session since re-assessment.  Increased pain   PERTINENT HISTORY:  Chronic back pain From eval: Pt goes out of town once a week to help a friend -- helped with yard work and is hurting today. 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 Pt reports leg weakness and pain. Pt states pain of bending forward has been getting worse. Notes it  hurts to do dishes and laundry. By end of day pain is like a hornet's sting in R buttock, left one starts up to. Pt notes she is in a cycle of gaining weight. Pain is best in the morning 3/10. At most 8/10 by the end of day (around 5pm). States she has to stretch to keep pain at bay during the day.   PAIN:  Are you having pain? Yes: NPRS scale: 4/10 Pain location: Bilat buttocks (R worse than L) Pain description: Throbbing, dull, achy Aggravating factors: Bending forward Relieving factors: Pain medication, stretching  PRECAUTIONS: Fall  WEIGHT BEARING RESTRICTIONS: No  FALLS:  Has patient fallen in last 6 months? Yes. 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 L3-L4 right eccentric disc bulge may contact the exiting right L3 nerve. 2. L5-S1 mild left neural foraminal narrowing. 3. Narrowing of the right lateral recess at L1-L2 and left lateral recess at L2-L3 could affect the  descending right L2 and left L3 nerves, respectively.  PATIENT SURVEYS:  FOTO 44; predicted 54  MUSCLE LENGTH: Hamstrings: Right 90 deg; Left 90 deg Thomas test: negative B  POSTURE: decreased lumbar lordosis  PALPATION: TTP bilat glutes and piriformis (worse R than L)  LUMBAR ROM:   AROM eval 06/29/22  Flexion 60% 75%  Extension 25%* 25%  Right lateral flexion 50%* 50%  Left lateral flexion 60%* 50%  Right rotation 25% 10%  Left rotation 25% 25%   (Soft tissue restrictions noted throughout)   LOWER EXTREMITY ROM:   WFL throughout  Active  Right eval Left eval  Hip flexion    Hip extension    Hip abduction    Hip adduction    Hip internal rotation    Hip external rotation    Knee flexion    Knee extension    Ankle dorsiflexion    Ankle plantarflexion    Ankle inversion    Ankle eversion     (Blank rows = not tested)  LOWER EXTREMITY MMT:    MMT Right eval Left eval  Hip flexion 4 4  Hip extension 4 3  Hip abduction 4 4  Hip adduction    Hip internal rotation    Hip external rotation    Knee flexion 4 4+  Knee extension 4+ 4+  Ankle dorsiflexion 4 3  Ankle plantarflexion 4 3  Ankle inversion    Ankle eversion     (Blank rows = not tested)  LUMBAR SPECIAL TESTS:  Slump test negative B  FUNCTIONAL TESTS:  5 times sit to stand: 10.03 sec  ; 06/29/22 10s arms crossed L SLS: 22.39 sec; R SLS: 11.96 sec (with trendelenburg)   GAIT: Distance walked: 100' Assistive device utilized: None Level of assistance: Complete Independence Comments: Mildly antalgic, L ankle brace today  TODAY'S TREATMENT:   [facility] Adult PT Treatment:  DATE: 07/19/22 Therapeutic Exercise: Seated hamstring stretch 30s x2 QL stretch 30s x2 B  Curl up L1 15x  PPT 3s 10x PPT with alternating march 10x PPT with alternating OH flexion 10x Bridge with ball 10x Open book    [facility] Adult PT Treatment:                                                 DATE: 06/29/22 Eval and re-assessment   [facility] Adult PT Treatment:                                                DATE: 05/24/22 Therapeutic Exercise: Nustep L6 x 6 min UEs/LEs Side step green TB x3 at counter Standing hip ext at counter GTB at ankles x10 BIL Palloff press 7# 2x10 BIL Shoulder ext 2 7# cables 2x10 - cues for core activation  Supine PPT 5" hold x10 Bridges 2x10 Supine figure 4 piriformis stretch 2x30" BIL push/pull Supine hamstring stretch x30" BIL  [facility] Adult PT Treatment:                                                DATE: 05/12/22 Therapeutic Exercise: Nustep L6 x 3 min UEs/LEs Side step green TB x3 at counter Forwards/Backwards monster walk green TB x2 at counter Palloff press 7# 2x10 BIL Shoulder ext 2 3# cables 2x10 - cues for core activation  Supine PPT 5" hold 2x10   [facility] Adult PT Treatment:                                                DATE: 05/09/22 Therapeutic Exercise: Nustep L5 x 6 min UEs/LEs Side step green TB x3 at counter Forwards/Backwards monster walk green TB x3 at counter Palloff press red TB doubled 2x10x5 sec Shoulder ext red TB 2x10x5 sec                                                                                                                               PATIENT EDUCATION:  Education details: Exam findings, POC, aquatics, initial HEP Person educated: Patient Education method: Programmer, multimedia, Demonstration, and Handouts Education comprehension: verbalized understanding, returned demonstration, and needs further education  HOME EXERCISE PROGRAM: Access Code: [email or web address] URL: [email or web address] Date: 07/19/2022 Prepared by: Gustavus Bryant  Exercises - Supine Posterior Pelvic Tilt  - 1 x daily - 7 x weekly - 2 sets - 10 reps -  Supine March with Posterior Pelvic Tilt  - 1 x daily - 7 x weekly - 2 sets - 10 reps - Supine Bridge  - 1 x daily - 7 x weekly - 2 sets - 10 reps - Sidelying Open Book Thoracic  Lumbar Rotation and Extension  - 1 x daily - 7 x weekly - 2 sets - 10 reps - Curl Up with Reach  - 1 x daily - 7 x weekly - 2 sets - 10 reps  ASSESSMENT:  CLINICAL IMPRESSION: Todays session incorporated stretching tasks to improve lumbar mobility and increase intersegmental motion and provide myofascial stretching.  HEP updated to address progress and remaining deficits.  (Re-assess)Patient returns to outpatient physical therapy to resume plan of care to address low back pain.  Since last visit patient reports symptoms have greatly improved.  She continues to experience pain with prolonged positioning or when performing prolonged activities.  Today's assessment found good range of motion throughout bilateral lower extremities including hips.  No hamstring tightness was detected in all neuro tension signs were unremarkable.  Patient does show marked restrictions in trunk mobility demonstrating a soft tissue restriction pattern is mobility limited throughout.  Spring testing of lumbar spine elicited symptoms and found decreased intersegmental mobility.  Soft tissue restrictions as well as myofascial limitations were noted throughout lumbosacral area.  Taut bands were also detected throughout paraspinals right being greater than left.  Patient has been recommended to undergo epidural steroid injections however initial claim has been denied due to lack of conservative treatment documentation to date.  Patient is a good candidate to resume physical therapy and may benefit from trigger point dry needling to address muscular tension.    OBJECTIVE IMPAIRMENTS: Abnormal gait, decreased balance, decreased endurance, decreased mobility, difficulty walking, decreased strength, increased fascial restrictions, increased muscle spasms, impaired flexibility, impaired UE functional use, improper body mechanics, postural dysfunction, and pain.   ACTIVITY LIMITATIONS: carrying, lifting, bending, standing, sleeping,  stairs, transfers, bed mobility, toileting, dressing, hygiene/grooming, locomotion level, and caring for others  PARTICIPATION LIMITATIONS: meal prep, cleaning, laundry, shopping, community activity, occupation, and yard work  PERSONAL FACTORS: Fitness, Past/current experiences, and Time since onset of injury/illness/exacerbation are also affecting patient's functional outcome.   REHAB POTENTIAL: Good  CLINICAL DECISION MAKING: Evolving/moderate complexity  EVALUATION COMPLEXITY: Moderate   GOALS: Goals reviewed with patient? Yes  SHORT TERM GOALS: Target date: 07/20/2022     Pt will be ind with initial HEP Baseline:[email or web address] Goal status: MET  2.  Pt will be ind with maintaining proper lifting mechanics for work and home tasks Baseline:  Goal status: IN PROGRESS    LONG TERM GOALS: Target date: 08/10/2022   Pt will be ind with management and progression of HEP Baseline:  Goal status: INITIAL  2.  Pt will maintain SLS for at least 37 sec R&L to be within age norms to demo improved LE and core stability Baseline:  Goal status: INITIAL  3.  Pt will report reduced pain by >/=50% Baseline:  Goal status: INITIAL  4.  Pt will have 10% improvement in lumbar ROM with </=2/10 pain Baseline:  Goal status: INITIAL  5.  Pt will have improved FOTO to >/=54 Baseline:  Goal status: INITIAL  PLAN:  PT FREQUENCY: 1-2x/week  PT DURATION: 8 weeks  PLANNED INTERVENTIONS: Therapeutic exercises, Therapeutic activity, Neuromuscular re-education, Balance training, Gait training, Patient/Family education, Self Care, Joint mobilization, Stair training, Aquatic Therapy, Dry Needling, Electrical stimulation, Spinal manipulation, Spinal mobilization, Cryotherapy, Moist heat, scar mobilization,  Taping, Traction, Ionotophoresis 4mg /ml Dexamethasone, Manual therapy, and Re-evaluation.  PLAN FOR NEXT SESSION: Assess response to HEP. Continue core and hip strengthening. Work on Horticulturist, commercial.    Hildred Laser, PT 07/19/2022, 3:39 PM  Check all possible CPT codes: 16109 - PT Re-evaluation, 97110- Therapeutic Exercise, [phone]- Neuro Re-education, [phone] - Gait Training, [phone] - Manual Therapy, [phone] - Therapeutic Activities, and [phone] - Self Care    Check all conditions that are expected to impact treatment: {Conditions expected to impact treatment:Musculoskeletal disorders and Contractures, spasticity or fracture relevant to requested treatment   If treatment provided at initial evaluation, no treatment charged due to lack of authorization.

## 2022-07-26 ENCOUNTER — Ambulatory Visit: Payer: Medicaid Other

## 2022-07-26 DIAGNOSIS — M5459 Other low back pain: Secondary | ICD-10-CM

## 2022-07-26 DIAGNOSIS — M5431 Sciatica, right side: Secondary | ICD-10-CM

## 2022-07-26 DIAGNOSIS — M6281 Muscle weakness (generalized): Secondary | ICD-10-CM | POA: Diagnosis not present

## 2022-07-28 ENCOUNTER — Other Ambulatory Visit: Payer: Self-pay | Admitting: Family Medicine

## 2022-07-28 DIAGNOSIS — R600 Localized edema: Secondary | ICD-10-CM

## 2022-08-02 ENCOUNTER — Ambulatory Visit: Payer: Medicaid Other

## 2022-08-02 NOTE — Therapy (Deleted)
OUTPATIENT PHYSICAL THERAPY TREATMENT   Patient Name: Stacy Barrett MRN: [id number] DOB:03/17/1965, 57 y.o., female Today's Date: 07/19/2022  END OF SESSION:  PT End of Session - 07/19/22 1506     Visit Number 9    Number of Visits 15    Date for PT Re-Evaluation 08/24/22    Authorization Type Medicaid    Authorization Time Period 07/18/22-08/20/22    Authorization - Number of Visits 4    PT Start Time 1505    PT Stop Time 1530    PT Time Calculation (min) 25 min    Activity Tolerance Patient tolerated treatment well    Behavior During Therapy WFL for tasks assessed/performed                  Past Medical History:  Diagnosis Date   Anxiety    Depression    Edema    Hypertension    Lumbar radiculopathy    Pituitary adenoma (HCC)    Vertebral fracture    History reviewed. No pertinent surgical history. Patient Active Problem List   Diagnosis Date Noted   MDD (major depressive disorder), recurrent episode, moderate (HCC) 01/27/2021   GAD (generalized anxiety disorder) 01/27/2021   IBS (irritable bowel syndrome) 05/19/2017   Tobacco abuse 05/19/2017   Low back pain 06/13/2016   Hyperlipidemia 11/02/2015   Panic disorder with agoraphobia 10/08/2013   Panic attacks 10/07/2013   Major depression, recurrent (HCC) 10/07/2013   Anxiety 10/06/2013   Depression 10/06/2013   Vitamin D insufficiency 06/26/2013   Hypertension 06/22/2013   IUD (intrauterine device) in place 06/22/2013   Cervical cancer screening 06/22/2013    PCP: Newlin, Enobong  REFERRING PROVIDER: Kirsteins, Andrew E, MD  REFERRING DIAG: M43.16 (ICD-10-CM) - Spondylolisthesis, lumbar region  Rationale for Evaluation and Treatment: Rehabilitation  THERAPY DIAG:  Muscle weakness (generalized)  Other low back pain  Sciatica, right side  ONSET DATE: ~15 years ago  SUBJECTIVE:                                                                                                                                                                                            SUBJECTIVE STATEMENT:First f/u session since re-assessment.  Increased pain   PERTINENT HISTORY:  Chronic back pain From eval: Pt goes out of town once a week to help a friend -- helped with yard work and is hurting today. 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 Pt reports leg weakness and pain. Pt states pain of bending forward has been getting worse. Notes it   hurts to do dishes and laundry. By end of day pain is like a hornet's sting in R buttock, left one starts up to. Pt notes she is in a cycle of gaining weight. Pain is best in the morning 3/10. At most 8/10 by the end of day (around 5pm). States she has to stretch to keep pain at bay during the day.   PAIN:  Are you having pain? Yes: NPRS scale: 4/10 Pain location: Bilat buttocks (R worse than L) Pain description: Throbbing, dull, achy Aggravating factors: Bending forward Relieving factors: Pain medication, stretching  PRECAUTIONS: Fall  WEIGHT BEARING RESTRICTIONS: No  FALLS:  Has patient fallen in last 6 months? Yes. 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 L3-L4 right eccentric disc bulge may contact the exiting right L3 nerve. 2. L5-S1 mild left neural foraminal narrowing. 3. Narrowing of the right lateral recess at L1-L2 and left lateral recess at L2-L3 could affect the  descending right L2 and left L3 nerves, respectively.  PATIENT SURVEYS:  FOTO 44; predicted 54  MUSCLE LENGTH: Hamstrings: Right 90 deg; Left 90 deg Thomas test: negative B  POSTURE: decreased lumbar lordosis  PALPATION: TTP bilat glutes and piriformis (worse R than L)  LUMBAR ROM:   AROM eval 06/29/22  Flexion 60% 75%  Extension 25%* 25%  Right lateral flexion 50%* 50%  Left lateral flexion 60%* 50%  Right rotation 25% 10%  Left rotation 25% 25%   (Soft tissue restrictions noted throughout)   LOWER EXTREMITY ROM:   WFL throughout  Active  Right eval Left eval  Hip flexion    Hip extension    Hip abduction    Hip adduction    Hip internal rotation    Hip external rotation    Knee flexion    Knee extension    Ankle dorsiflexion    Ankle plantarflexion    Ankle inversion    Ankle eversion     (Blank rows = not tested)  LOWER EXTREMITY MMT:    MMT Right eval Left eval  Hip flexion 4 4  Hip extension 4 3  Hip abduction 4 4  Hip adduction    Hip internal rotation    Hip external rotation    Knee flexion 4 4+  Knee extension 4+ 4+  Ankle dorsiflexion 4 3  Ankle plantarflexion 4 3  Ankle inversion    Ankle eversion     (Blank rows = not tested)  LUMBAR SPECIAL TESTS:  Slump test negative B  FUNCTIONAL TESTS:  5 times sit to stand: 10.03 sec  ; 06/29/22 10s arms crossed L SLS: 22.39 sec; R SLS: 11.96 sec (with trendelenburg)   GAIT: Distance walked: 100' Assistive device utilized: None Level of assistance: Complete Independence Comments: Mildly antalgic, L ankle brace today  TODAY'S TREATMENT:   [facility] Adult PT Treatment:                                                  DATE: 07/19/22 Therapeutic Exercise: Seated hamstring stretch 30s x2 QL stretch 30s x2 B  Curl up L1 15x  PPT 3s 10x PPT with alternating march 10x PPT with alternating OH flexion 10x Bridge with ball 10x Open book    [facility] Adult PT Treatment:                                                 DATE: 06/29/22 Eval and re-assessment   [facility] Adult PT Treatment:                                                DATE: 05/24/22 Therapeutic Exercise: Nustep L6 x 6 min UEs/LEs Side step green TB x3 at counter Standing hip ext at counter GTB at ankles x10 BIL Palloff press 7# 2x10 BIL Shoulder ext 2 7# cables 2x10 - cues for core activation  Supine PPT 5" hold x10 Bridges 2x10 Supine figure 4 piriformis stretch 2x30" BIL push/pull Supine hamstring stretch x30" BIL  [facility] Adult PT Treatment:                                                DATE: 05/12/22 Therapeutic Exercise: Nustep L6 x 3 min UEs/LEs Side step green TB x3 at counter Forwards/Backwards monster walk green TB x2 at counter Palloff press 7# 2x10 BIL Shoulder ext 2 3# cables 2x10 - cues for core activation  Supine PPT 5" hold 2x10   [facility] Adult PT Treatment:                                                DATE: 05/09/22 Therapeutic Exercise: Nustep L5 x 6 min UEs/LEs Side step green TB x3 at counter Forwards/Backwards monster walk green TB x3 at counter Palloff press red TB doubled 2x10x5 sec Shoulder ext red TB 2x10x5 sec                                                                                                                               PATIENT EDUCATION:  Education details: Exam findings, POC, aquatics, initial HEP Person educated: Patient Education method: Explanation, Demonstration, and Handouts Education comprehension: verbalized understanding, returned demonstration, and needs further education  HOME EXERCISE PROGRAM: Access Code: [email or web address] URL: [email or web address] Date: 07/19/2022 Prepared by: Brittinee Risk  Exercises - Supine Posterior Pelvic Tilt  - 1 x daily - 7 x weekly - 2 sets - 10 reps -   Supine March with Posterior Pelvic Tilt  - 1 x daily - 7 x weekly - 2 sets - 10 reps - Supine Bridge  - 1 x daily - 7 x weekly - 2 sets - 10 reps - Sidelying Open Book Thoracic  Lumbar Rotation and Extension  - 1 x daily - 7 x weekly - 2 sets - 10 reps - Curl Up with Reach  - 1 x daily - 7 x weekly - 2 sets - 10 reps  ASSESSMENT:  CLINICAL IMPRESSION: Todays session incorporated stretching tasks to improve lumbar mobility and increase intersegmental motion and provide myofascial stretching.  HEP updated to address progress and remaining deficits.  (Re-assess)Patient returns to outpatient physical therapy to resume plan of care to address low back pain.  Since last visit patient reports symptoms have greatly improved.  She continues to experience pain with prolonged positioning or when performing prolonged activities.  Today's assessment found good range of motion throughout bilateral lower extremities including hips.  No hamstring tightness was detected in all neuro tension signs were unremarkable.  Patient does show marked restrictions in trunk mobility demonstrating a soft tissue restriction pattern is mobility limited throughout.  Spring testing of lumbar spine elicited symptoms and found decreased intersegmental mobility.  Soft tissue restrictions as well as myofascial limitations were noted throughout lumbosacral area.  Taut bands were also detected throughout paraspinals right being greater than left.  Patient has been recommended to undergo epidural steroid injections however initial claim has been denied due to lack of conservative treatment documentation to date.  Patient is a good candidate to resume physical therapy and may benefit from trigger point dry needling to address muscular tension.    OBJECTIVE IMPAIRMENTS: Abnormal gait, decreased balance, decreased endurance, decreased mobility, difficulty walking, decreased strength, increased fascial restrictions, increased muscle spasms, impaired flexibility, impaired UE functional use, improper body mechanics, postural dysfunction, and pain.   ACTIVITY LIMITATIONS: carrying, lifting, bending, standing, sleeping,  stairs, transfers, bed mobility, toileting, dressing, hygiene/grooming, locomotion level, and caring for others  PARTICIPATION LIMITATIONS: meal prep, cleaning, laundry, shopping, community activity, occupation, and yard work  PERSONAL FACTORS: Fitness, Past/current experiences, and Time since onset of injury/illness/exacerbation are also affecting patient's functional outcome.   REHAB POTENTIAL: Good  CLINICAL DECISION MAKING: Evolving/moderate complexity  EVALUATION COMPLEXITY: Moderate   GOALS: Goals reviewed with patient? Yes  SHORT TERM GOALS: Target date: 07/20/2022     Pt will be ind with initial HEP Baseline:[email or web address] Goal status: MET  2.  Pt will be ind with maintaining proper lifting mechanics for work and home tasks Baseline:  Goal status: IN PROGRESS    LONG TERM GOALS: Target date: 08/10/2022   Pt will be ind with management and progression of HEP Baseline:  Goal status: INITIAL  2.  Pt will maintain SLS for at least 37 sec R&L to be within age norms to demo improved LE and core stability Baseline:  Goal status: INITIAL  3.  Pt will report reduced pain by >/=50% Baseline:  Goal status: INITIAL  4.  Pt will have 10% improvement in lumbar ROM with </=2/10 pain Baseline:  Goal status: INITIAL  5.  Pt will have improved FOTO to >/=54 Baseline:  Goal status: INITIAL  PLAN:  PT FREQUENCY: 1-2x/week  PT DURATION: 8 weeks  PLANNED INTERVENTIONS: Therapeutic exercises, Therapeutic activity, Neuromuscular re-education, Balance training, Gait training, Patient/Family education, Self Care, Joint mobilization, Stair training, Aquatic Therapy, Dry Needling, Electrical stimulation, Spinal manipulation, Spinal mobilization, Cryotherapy, Moist heat, scar mobilization,   Taping, Traction, Ionotophoresis 4mg/ml Dexamethasone, Manual therapy, and Re-evaluation.  PLAN FOR NEXT SESSION: Assess response to HEP. Continue core and hip strengthening. Work on lifting  mechanics.    Judea Riches M Saprina Chuong, PT 07/19/2022, 3:39 PM  Check all possible CPT codes: 97164 - PT Re-evaluation, 97110- Therapeutic Exercise, 97112- Neuro Re-education, 97116 - Gait Training, 97140 - Manual Therapy, 97530 - Therapeutic Activities, and 97535 - Self Care    Check all conditions that are expected to impact treatment: {Conditions expected to impact treatment:Musculoskeletal disorders and Contractures, spasticity or fracture relevant to requested treatment   If treatment provided at initial evaluation, no treatment charged due to lack of authorization.      

## 2022-08-03 ENCOUNTER — Telehealth: Payer: Self-pay

## 2022-08-03 NOTE — Telephone Encounter (Signed)
letter

## 2022-08-08 NOTE — Therapy (Signed)
OUTPATIENT PHYSICAL THERAPY TREATMENT   Patient Name: Stacy Barrett MRN: [id number] DOB:10/14/1965, 57 y.o., female Today's Date: 07/19/2022  END OF SESSION:  PT End of Session - 07/19/22 1506     Visit Number 9    Number of Visits 15    Date for PT Re-Evaluation 08/24/22    Authorization Type Medicaid    Authorization Time Period 07/18/22-08/20/22    Authorization - Number of Visits 4    PT Start Time 1505    PT Stop Time 1530    PT Time Calculation (min) 25 min    Activity Tolerance Patient tolerated treatment well    Behavior During Therapy WFL for tasks assessed/performed                  Past Medical History:  Diagnosis Date   Anxiety    Depression    Edema    Hypertension    Lumbar radiculopathy    Pituitary adenoma (HCC)    Vertebral fracture    History reviewed. No pertinent surgical history. Patient Active Problem List   Diagnosis Date Noted   MDD (major depressive disorder), recurrent episode, moderate (HCC) 01/27/2021   GAD (generalized anxiety disorder) 01/27/2021   IBS (irritable bowel syndrome) 05/19/2017   Tobacco abuse 05/19/2017   Low back pain 06/13/2016   Hyperlipidemia 11/02/2015   Panic disorder with agoraphobia 10/08/2013   Panic attacks 10/07/2013   Major depression, recurrent (HCC) 10/07/2013   Anxiety 10/06/2013   Depression 10/06/2013   Vitamin D insufficiency 06/26/2013   Hypertension 06/22/2013   IUD (intrauterine device) in place 06/22/2013   Cervical cancer screening 06/22/2013    PCP: Newlin, Enobong  REFERRING PROVIDER: Kirsteins, Andrew E, MD  REFERRING DIAG: M43.16 (ICD-10-CM) - Spondylolisthesis, lumbar region  Rationale for Evaluation and Treatment: Rehabilitation  THERAPY DIAG:  Muscle weakness (generalized)  Other low back pain  Sciatica, right side  ONSET DATE: ~15 years ago  SUBJECTIVE:                                                                                                                                                                                            SUBJECTIVE STATEMENT:First f/u session since re-assessment.  Increased pain   PERTINENT HISTORY:  Chronic back pain From eval: Pt goes out of town once a week to help a friend -- helped with yard work and is hurting today. 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 Pt reports leg weakness and pain. Pt states pain of bending forward has been getting worse. Notes it   hurts to do dishes and laundry. By end of day pain is like a hornet's sting in R buttock, left one starts up to. Pt notes she is in a cycle of gaining weight. Pain is best in the morning 3/10. At most 8/10 by the end of day (around 5pm). States she has to stretch to keep pain at bay during the day.   PAIN:  Are you having pain? Yes: NPRS scale: 4/10 Pain location: Bilat buttocks (R worse than L) Pain description: Throbbing, dull, achy Aggravating factors: Bending forward Relieving factors: Pain medication, stretching  PRECAUTIONS: Fall  WEIGHT BEARING RESTRICTIONS: No  FALLS:  Has patient fallen in last 6 months? Yes. 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 L3-L4 right eccentric disc bulge may contact the exiting right L3 nerve. 2. L5-S1 mild left neural foraminal narrowing. 3. Narrowing of the right lateral recess at L1-L2 and left lateral recess at L2-L3 could affect the  descending right L2 and left L3 nerves, respectively.  PATIENT SURVEYS:  FOTO 44; predicted 54  MUSCLE LENGTH: Hamstrings: Right 90 deg; Left 90 deg Thomas test: negative B  POSTURE: decreased lumbar lordosis  PALPATION: TTP bilat glutes and piriformis (worse R than L)  LUMBAR ROM:   AROM eval 06/29/22  Flexion 60% 75%  Extension 25%* 25%  Right lateral flexion 50%* 50%  Left lateral flexion 60%* 50%  Right rotation 25% 10%  Left rotation 25% 25%   (Soft tissue restrictions noted throughout)   LOWER EXTREMITY ROM:   WFL throughout  Active  Right eval Left eval  Hip flexion    Hip extension    Hip abduction    Hip adduction    Hip internal rotation    Hip external rotation    Knee flexion    Knee extension    Ankle dorsiflexion    Ankle plantarflexion    Ankle inversion    Ankle eversion     (Blank rows = not tested)  LOWER EXTREMITY MMT:    MMT Right eval Left eval  Hip flexion 4 4  Hip extension 4 3  Hip abduction 4 4  Hip adduction    Hip internal rotation    Hip external rotation    Knee flexion 4 4+  Knee extension 4+ 4+  Ankle dorsiflexion 4 3  Ankle plantarflexion 4 3  Ankle inversion    Ankle eversion     (Blank rows = not tested)  LUMBAR SPECIAL TESTS:  Slump test negative B  FUNCTIONAL TESTS:  5 times sit to stand: 10.03 sec  ; 06/29/22 10s arms crossed L SLS: 22.39 sec; R SLS: 11.96 sec (with trendelenburg)   GAIT: Distance walked: 100' Assistive device utilized: None Level of assistance: Complete Independence Comments: Mildly antalgic, L ankle brace today  TODAY'S TREATMENT:   [facility] Adult PT Treatment:                                                  DATE: 07/19/22 Therapeutic Exercise: Seated hamstring stretch 30s x2 QL stretch 30s x2 B  Curl up L1 15x  PPT 3s 10x PPT with alternating march 10x PPT with alternating OH flexion 10x Bridge with ball 10x Open book    [facility] Adult PT Treatment:                                                 DATE: 06/29/22 Eval and re-assessment   [facility] Adult PT Treatment:                                                DATE: 05/24/22 Therapeutic Exercise: Nustep L6 x 6 min UEs/LEs Side step green TB x3 at counter Standing hip ext at counter GTB at ankles x10 BIL Palloff press 7# 2x10 BIL Shoulder ext 2 7# cables 2x10 - cues for core activation  Supine PPT 5" hold x10 Bridges 2x10 Supine figure 4 piriformis stretch 2x30" BIL push/pull Supine hamstring stretch x30" BIL  [facility] Adult PT Treatment:                                                DATE: 05/12/22 Therapeutic Exercise: Nustep L6 x 3 min UEs/LEs Side step green TB x3 at counter Forwards/Backwards monster walk green TB x2 at counter Palloff press 7# 2x10 BIL Shoulder ext 2 3# cables 2x10 - cues for core activation  Supine PPT 5" hold 2x10   [facility] Adult PT Treatment:                                                DATE: 05/09/22 Therapeutic Exercise: Nustep L5 x 6 min UEs/LEs Side step green TB x3 at counter Forwards/Backwards monster walk green TB x3 at counter Palloff press red TB doubled 2x10x5 sec Shoulder ext red TB 2x10x5 sec                                                                                                                               PATIENT EDUCATION:  Education details: Exam findings, POC, aquatics, initial HEP Person educated: Patient Education method: Explanation, Demonstration, and Handouts Education comprehension: verbalized understanding, returned demonstration, and needs further education  HOME EXERCISE PROGRAM: Access Code: [email or web address] URL: [email or web address] Date: 07/19/2022 Prepared by: London Nonaka  Exercises - Supine Posterior Pelvic Tilt  - 1 x daily - 7 x weekly - 2 sets - 10 reps -   Supine March with Posterior Pelvic Tilt  - 1 x daily - 7 x weekly - 2 sets - 10 reps - Supine Bridge  - 1 x daily - 7 x weekly - 2 sets - 10 reps - Sidelying Open Book Thoracic  Lumbar Rotation and Extension  - 1 x daily - 7 x weekly - 2 sets - 10 reps - Curl Up with Reach  - 1 x daily - 7 x weekly - 2 sets - 10 reps  ASSESSMENT:  CLINICAL IMPRESSION: Todays session incorporated stretching tasks to improve lumbar mobility and increase intersegmental motion and provide myofascial stretching.  HEP updated to address progress and remaining deficits.  (Re-assess)Patient returns to outpatient physical therapy to resume plan of care to address low back pain.  Since last visit patient reports symptoms have greatly improved.  She continues to experience pain with prolonged positioning or when performing prolonged activities.  Today's assessment found good range of motion throughout bilateral lower extremities including hips.  No hamstring tightness was detected in all neuro tension signs were unremarkable.  Patient does show marked restrictions in trunk mobility demonstrating a soft tissue restriction pattern is mobility limited throughout.  Spring testing of lumbar spine elicited symptoms and found decreased intersegmental mobility.  Soft tissue restrictions as well as myofascial limitations were noted throughout lumbosacral area.  Taut bands were also detected throughout paraspinals right being greater than left.  Patient has been recommended to undergo epidural steroid injections however initial claim has been denied due to lack of conservative treatment documentation to date.  Patient is a good candidate to resume physical therapy and may benefit from trigger point dry needling to address muscular tension.    OBJECTIVE IMPAIRMENTS: Abnormal gait, decreased balance, decreased endurance, decreased mobility, difficulty walking, decreased strength, increased fascial restrictions, increased muscle spasms, impaired flexibility, impaired UE functional use, improper body mechanics, postural dysfunction, and pain.   ACTIVITY LIMITATIONS: carrying, lifting, bending, standing, sleeping,  stairs, transfers, bed mobility, toileting, dressing, hygiene/grooming, locomotion level, and caring for others  PARTICIPATION LIMITATIONS: meal prep, cleaning, laundry, shopping, community activity, occupation, and yard work  PERSONAL FACTORS: Fitness, Past/current experiences, and Time since onset of injury/illness/exacerbation are also affecting patient's functional outcome.   REHAB POTENTIAL: Good  CLINICAL DECISION MAKING: Evolving/moderate complexity  EVALUATION COMPLEXITY: Moderate   GOALS: Goals reviewed with patient? Yes  SHORT TERM GOALS: Target date: 07/20/2022     Pt will be ind with initial HEP Baseline:[email or web address] Goal status: MET  2.  Pt will be ind with maintaining proper lifting mechanics for work and home tasks Baseline:  Goal status: IN PROGRESS    LONG TERM GOALS: Target date: 08/10/2022   Pt will be ind with management and progression of HEP Baseline:  Goal status: INITIAL  2.  Pt will maintain SLS for at least 37 sec R&L to be within age norms to demo improved LE and core stability Baseline:  Goal status: INITIAL  3.  Pt will report reduced pain by >/=50% Baseline:  Goal status: INITIAL  4.  Pt will have 10% improvement in lumbar ROM with </=2/10 pain Baseline:  Goal status: INITIAL  5.  Pt will have improved FOTO to >/=54 Baseline:  Goal status: INITIAL  PLAN:  PT FREQUENCY: 1-2x/week  PT DURATION: 8 weeks  PLANNED INTERVENTIONS: Therapeutic exercises, Therapeutic activity, Neuromuscular re-education, Balance training, Gait training, Patient/Family education, Self Care, Joint mobilization, Stair training, Aquatic Therapy, Dry Needling, Electrical stimulation, Spinal manipulation, Spinal mobilization, Cryotherapy, Moist heat, scar mobilization,   Taping, Traction, Ionotophoresis 4mg/ml Dexamethasone, Manual therapy, and Re-evaluation.  PLAN FOR NEXT SESSION: Assess response to HEP. Continue core and hip strengthening. Work on lifting  mechanics.    Lashena Signer M Eliodoro Gullett, PT 07/19/2022, 3:39 PM  Check all possible CPT codes: 97164 - PT Re-evaluation, 97110- Therapeutic Exercise, 97112- Neuro Re-education, 97116 - Gait Training, 97140 - Manual Therapy, 97530 - Therapeutic Activities, and 97535 - Self Care    Check all conditions that are expected to impact treatment: {Conditions expected to impact treatment:Musculoskeletal disorders and Contractures, spasticity or fracture relevant to requested treatment   If treatment provided at initial evaluation, no treatment charged due to lack of authorization.      

## 2022-08-09 ENCOUNTER — Ambulatory Visit: Payer: Medicaid Other

## 2022-08-09 DIAGNOSIS — M6281 Muscle weakness (generalized): Secondary | ICD-10-CM | POA: Diagnosis not present

## 2022-08-09 DIAGNOSIS — M5459 Other low back pain: Secondary | ICD-10-CM

## 2022-08-09 DIAGNOSIS — M5431 Sciatica, right side: Secondary | ICD-10-CM

## 2022-08-12 ENCOUNTER — Ambulatory Visit (HOSPITAL_COMMUNITY): Payer: Medicaid Other | Admitting: Clinical

## 2022-08-12 DIAGNOSIS — F411 Generalized anxiety disorder: Secondary | ICD-10-CM

## 2022-08-12 DIAGNOSIS — F331 Major depressive disorder, recurrent, moderate: Secondary | ICD-10-CM

## 2022-08-13 NOTE — Progress Notes (Signed)
Comprehensive Clinical Assessment (CCA) Note  08/12/2022 Stacy Barrett [id number]  Virtual Visit via Video Note  I connected with Stacy Barrett on 08/12/2022 at 11:00 AM EDT by a video enabled telemedicine application and verified that I am speaking with the correct person using two identifiers.  Location: Patient: home Provider: office   I discussed the limitations of evaluation and management by telemedicine and the availability of in person appointments. The patient expressed understanding and agreed to proceed.   Follow Up Instructions: I discussed the assessment and treatment plan with the patient. The patient was provided an opportunity to ask questions and all were answered. The patient agreed with the plan and demonstrated an understanding of the instructions.   The patient was advised to call back or seek an in-person evaluation if the symptoms worsen or if the condition fails to improve as anticipated.  I provided 45 minutes of non-face-to-face time during this encounter.   Stacy Fee, LCSW   Chief Complaint:  Chief Complaint  Patient presents with   Depression   Anxiety   Visit Diagnosis:   Moderate episode of major depressive disorder Generalized anxiety disorder  Interpretive Summary: Client is a 57 year old female presenting to the [facility]. Client is currently being followed by a [facility] psychiatrist for the treatment of depression and anxiety. Client reported her symptoms have been reoccurring for over a year but have worsened within the past 6 months related to a friendship ending. Client reported she has been feeling weird after ending a 10 year friendship with a female friend who had preexisting anger/ rage issues. Client reported her friend had become verbally abusive and belittling her. Client reported this has been her only friend for 10 years and social outlet. Client reported she has no one to talk to now. Client reported  having episodes of crying, not feeling happy and burned out. Client reported she feels disappointment becoming involved within as such. Client reported she feels jumpy and has triggers stemming from the long term friendship. Client reported she has also lost motivation to engage in life socially outside of home. 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 Client reported the use of marijuana daily to  help alleviate her symptoms. Client presented oriented times five, appropriately dressed and friendly. Client denied hallucinations, delusions, suicidal and homicidal ideations. Client was screened for pain, nutrition, [place] suicide severity and the following SDOH:    07/06/2022   11:22 AM 01/27/2022   11:36 AM 01/25/2022    4:24 PM 10/25/2021    2:56 PM  GAD 7 : Generalized Anxiety Score  Nervous, Anxious, on Edge 2 1 2 1   Control/stop worrying 2 2 2 1   Worry too much - different things 1 2 2 2   Trouble relaxing 1 1 2 2   Restless 0 0 0 0  Easily annoyed or irritable 1 1 2 2   Afraid - awful might happen 1 3 2 3   Total GAD 7 Score 8 10 12 11   Anxiety Difficulty Not difficult at all Not difficult at all  Somewhat difficult     Flowsheet Row Video Visit from 07/06/2022 in [facility]  PHQ-9 Total Score 11        Treatment recommendations: individual counseling and psychiatry    CCA Biopsychosocial Intake/Chief Complaint:  client reported she has a history of depression and anxiety.  Current Symptoms/Problems: client  reported depressed mood, feeling on edge, worrying, nightmares, crying spells  Patient Reported Schizophrenia/Schizoaffective Diagnosis in Past: No  Strengths: seeking outpatient services  Preferences: counseling and medication management  Abilities: vocalize problems and needs  Type of Services Patient Feels are Needed: psychiatry and medication  management  Initial Clinical Notes/Concerns: No data recorded  Mental Health Symptoms Depression:   Change in energy/activity; Hopelessness; Tearfulness; Worthlessness   Duration of Depressive symptoms:  Greater than two weeks   Mania:   None   Anxiety:    Difficulty concentrating   Psychosis:   None   Duration of Psychotic symptoms: No data recorded  Trauma:   Guilt/shame   Obsessions:   None   Compulsions:   None   Inattention:   None   Hyperactivity/Impulsivity:   None   Oppositional/Defiant Behaviors:   N/A   Emotional Irregularity:   None   Other Mood/Personality Symptoms:  No data recorded   Mental Status Exam Appearance and self-care  Stature:   Average   Weight:   Average weight   Clothing:   Casual   Grooming:   Normal   Cosmetic use:   Age appropriate   Posture/gait:   Normal   Motor activity:   Not Remarkable   Sensorium  Attention:   Normal   Concentration:   Normal   Orientation:   X5   Recall/memory:   Normal   Affect and Mood  Affect:   Depressed   Mood:   Depressed   Relating  Eye contact:   None   Facial expression:   Depressed   Attitude toward examiner:   Cooperative   Thought and Language  Speech flow:  Clear and Coherent   Thought content:   Appropriate to Mood and Circumstances   Preoccupation:   None   Hallucinations:   None   Organization:  No data recorded  [employer] of Knowledge:   Good   Intelligence:   Average   Abstraction:   Normal   Judgement:   Good   Reality Testing:   Adequate   Insight:   Good   Decision Making:   Normal   Social Functioning  Social Maturity:   Responsible   Social Judgement:   Normal   Stress  Stressors:   Transitions; Financial   Coping Ability:   Normal   Skill Deficits:   Self-care   Supports:   Support needed     Religion: Religion/Spirituality Are You A Religious Person?:  No  Leisure/Recreation: Leisure / Recreation Do You Have Hobbies?: No  Exercise/Diet: Exercise/Diet Do You Exercise?: No Have You Gained or Lost A Significant Amount of Weight in the Past Six Months?: No Do You Follow a Special Diet?: No Do You Have Any Trouble Sleeping?: Yes   CCA Employment/Education Employment/Work Situation: Employment / Work Situation Employment Situation: Unemployed Patient's Job has Been Impacted by Current Illness: No  Education: Education Is Patient Currently Attending School?: No Did Garment/textile technologist From [employer]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 Did You Have An Individualized Education Program (IIEP): No Did You Have Any Difficulty At [employer]?: No Patient's Education Has Been Impacted by Current Illness: No   CCA Family/Childhood History Family and Relationship History:    Childhood History:     Child/Adolescent Assessment:     CCA Substance Use Alcohol/Drug Use: Alcohol / Drug Use History of alcohol / drug use?: Yes Substance #1 Name of Substance 1: marijuana 1 - Age of First Use: 57 1 - Amount (size/oz): ukn  1 - Frequency: daily 1 - Last Use / Amount: 07/11/2022 1 - Method of Aquiring: illegal 1- Route of Use: smoking                       ASAM's:  Six Dimensions of Multidimensional Assessment  Dimension 1:  Acute Intoxication and/or Withdrawal Potential:   Dimension 1:  Description of individual's past and current experiences of substance use and withdrawal: client reported no history of substance use treatment.  Dimension 2:  Biomedical Conditions and Complications:   Dimension 2:  Description of patient's biomedical conditions and  complications: client reported no medical conditions.  Dimension 3:  Emotional, Behavioral, or Cognitive Conditions and Complications:  Dimension 3:  Description of emotional, behavioral, or cognitive conditions and complications: client reported history of depression without suicidal ideations.  Dimension 4:   Readiness to Change:  Dimension 4:  Description of Readiness to Change criteria: client is in the precontemplation stage of change.  Dimension 5:  Relapse, Continued use, or Continued Problem Potential:  Dimension 5:  Relapse, continued use, or continued problem potential critiera description: client reported daily use.  Dimension 6:  Recovery/Living Environment:  Dimension 6:  Recovery/Iiving environment criteria description: client reported she has no support.  ASAM Severity Score: ASAM's Severity Rating Score: 2  ASAM Recommended Level of Treatment: ASAM Recommended Level of Treatment: Level I Outpatient Treatment   Substance use Disorder (SUD) Substance Use Disorder (SUD)  Checklist Symptoms of Substance Use: Evidence of tolerance  Recommendations for Services/Supports/Treatments: Recommendations for Services/Supports/Treatments Recommendations For Services/Supports/Treatments: Medication Management, Individual Therapy  DSM5 Diagnoses: Patient Active Problem List   Diagnosis Date Noted   MDD (major depressive disorder), recurrent episode, moderate (HCC) 01/27/2021   GAD (generalized anxiety disorder) 01/27/2021   IBS (irritable bowel syndrome) 05/19/2017   Tobacco abuse 05/19/2017   Low back pain 06/13/2016   Hyperlipidemia 11/02/2015   Panic disorder with agoraphobia 10/08/2013   Panic attacks 10/07/2013   Major depression, recurrent (HCC) 10/07/2013   Anxiety 10/06/2013   Depression 10/06/2013   Vitamin D insufficiency 06/26/2013   Hypertension 06/22/2013   IUD (intrauterine device) in place 06/22/2013   Cervical cancer screening 06/22/2013    Patient Centered Plan: Patient is on the following Treatment Plan(s):  Depression   Referrals to Alternative Service(s): Referred to Alternative Service(s):   Place:   Date:   Time:    Referred to Alternative Service(s):   Place:   Date:   Time:    Referred to Alternative Service(s):   Place:   Date:   Time:    Referred to  Alternative Service(s):   Place:   Date:   Time:      Collaboration of Care: Referral or follow-up with counselor/therapist [facility]  Patient/Guardian was advised Release of Information must be obtained prior to any record release in order to collaborate their care with an outside provider. Patient/Guardian was advised if they have not already done so to contact the registration department to sign all necessary forms in order for [place] to release information regarding their care.   Consent: Patient/Guardian gives verbal consent for treatment and assignment of benefits for services provided during this visit. Patient/Guardian expressed understanding and agreed to proceed.   Stacy Rhymes Luken Shadowens, LCSW

## 2022-08-15 NOTE — Therapy (Unsigned)
OUTPATIENT PHYSICAL THERAPY TREATMENT/DC SUMMARY   Patient Name: Stacy Barrett MRN: [id number] DOB:02/28/1965, 57 y.o., female Today's Date: 08/16/2022 PHYSICAL THERAPY DISCHARGE SUMMARY  Visits from Start of Care: 12  Current functional level related to goals / functional outcomes: Goals partially met   Remaining deficits: Pain, weakness   Education / Equipment: HEP   Patient agrees to discharge. Patient goals were partially met. Patient is being discharged due to maximized rehab potential.   END OF SESSION:  PT End of Session - 08/16/22 1457     Visit Number 12    Number of Visits 15    Date for PT Re-Evaluation 08/24/22    Authorization Type Medicaid    Authorization Time Period 07/18/22-08/20/22    Authorization - Number of Visits 4    PT Start Time 1500   patient late for session   PT Stop Time 1530    PT Time Calculation (min) 30 min    Activity Tolerance Patient tolerated treatment well    Behavior During Therapy WFL for tasks assessed/performed                  Past Medical History:  Diagnosis Date   Anxiety    Depression    Edema    Hypertension    Lumbar radiculopathy    Pituitary adenoma (HCC)    Vertebral fracture    History reviewed. No pertinent surgical history. Patient Active Problem List   Diagnosis Date Noted   MDD (major depressive disorder), recurrent episode, moderate (HCC) 01/27/2021   GAD (generalized anxiety disorder) 01/27/2021   IBS (irritable bowel syndrome) 05/19/2017   Tobacco abuse 05/19/2017   Low back pain 06/13/2016   Hyperlipidemia 11/02/2015   Panic disorder with agoraphobia 10/08/2013   Panic attacks 10/07/2013   Major depression, recurrent (HCC) 10/07/2013   Anxiety 10/06/2013   Depression 10/06/2013   Vitamin D insufficiency 06/26/2013   Hypertension 06/22/2013   IUD (intrauterine device) in place 06/22/2013   Cervical cancer screening 06/22/2013    PCP: Hoy Register  REFERRING PROVIDER: Erick Colace, MD  REFERRING DIAG: M43.16 (ICD-10-CM) - Spondylolisthesis, lumbar region  Rationale for Evaluation and Treatment: Rehabilitation  THERAPY DIAG:  Other low back pain  Sciatica, right side  Muscle weakness (generalized)  ONSET DATE: ~15 years ago  SUBJECTIVE:                                                                                                                                                                                           SUBJECTIVE STATEMENT: Describes persistent R sided low back pain.  Feels extension based exercises aggravates symptoms  PERTINENT HISTORY:  Chronic back pain From eval: Pt goes out of town once a week to help a friend -- helped with yard work and is hurting today. 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 Pt reports leg weakness and pain. Pt states pain of bending forward has been getting worse. Notes it hurts to do dishes and laundry. By end of day pain is like a hornet's sting in R buttock, left one starts up to. Pt notes she is in a cycle of gaining weight. Pain is best in the morning 3/10. At most 8/10 by the end of day (around 5pm). States she has to stretch to keep pain at bay during the day.   PAIN:  Are you having pain? Yes: NPRS scale: 4/10 Pain location: Bilat buttocks (R worse than L) Pain description: Throbbing, dull, achy Aggravating factors: Bending forward Relieving factors: Pain medication, stretching  PRECAUTIONS: Fall  WEIGHT BEARING RESTRICTIONS: No  FALLS:  Has patient fallen in last 6 months? Yes. 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 Works at [employer] and Sat -- mostly  standing job but also carrying buckets and sweeping Enjoys line dancing  PLOF: Independent  PATIENT GOALS: Improve mobility  NEXT MD VISIT: n/a  OBJECTIVE:   DIAGNOSTIC FINDINGS:  01/30/22 MRI IMPRESSION: 1. L3-L4 right eccentric disc bulge may contact the exiting right L3 nerve. 2. L5-S1 mild left neural foraminal narrowing. 3. Narrowing of the right lateral recess at L1-L2 and left lateral recess at L2-L3 could affect the descending right L2 and left L3 nerves, respectively.  PATIENT SURVEYS:  FOTO 44; predicted 54  MUSCLE LENGTH: Hamstrings: Right 90 deg; Left 90 deg Thomas test: negative B  POSTURE: decreased lumbar lordosis  PALPATION: TTP bilat glutes and piriformis (worse R than L)  LUMBAR ROM:   AROM eval 06/29/22 08/16/22  Flexion 60% 75% 90%  Extension 25%* 25% 25%  Right lateral flexion 50%* 50% 75%  Left lateral flexion 60%* 50% 75%  Right rotation 25% 10% 75%  Left rotation 25% 25% 90%   (Soft tissue restrictions noted throughout)   LOWER EXTREMITY ROM:   WFL throughout  Active  Right eval Left eval  Hip flexion    Hip extension    Hip abduction    Hip adduction    Hip internal rotation    Hip external rotation    Knee flexion    Knee extension    Ankle dorsiflexion    Ankle plantarflexion    Ankle inversion    Ankle eversion     (Blank rows = not tested)  LOWER EXTREMITY MMT:    MMT Right eval Left eval  Hip flexion 4 4  Hip extension 4 3  Hip abduction 4 4  Hip adduction    Hip internal rotation    Hip external rotation    Knee flexion 4 4+  Knee extension 4+ 4+  Ankle dorsiflexion 4 3  Ankle plantarflexion 4 3  Ankle inversion    Ankle eversion     (Blank rows = not tested)  LUMBAR SPECIAL TESTS:  Slump test negative B  FUNCTIONAL TESTS:  5 times sit to stand: 10.03 sec  ;  06/29/22 10s arms crossed L SLS: 22.39 sec; R SLS: 11.96 sec (with trendelenburg) R 37s, L 15s  GAIT: Distance walked: 100' Assistive device  utilized: None Level of assistance: Complete Independence Comments: Mildly antalgic, L ankle brace today  TODAY'S TREATMENT:   [facility] Adult PT Treatment:                                                DATE: 08/16/22 Therapeutic Exercise: PPT 3s 10x PPT with march 10/10 Curl up L1 15x Bridge against BlaTB Supine fallouts BlaTB 10x B 10/10 unilaterally QL stretch 30s x2 B   [facility] Adult PT Treatment:                                                DATE: 08/09/22 Therapeutic Exercise: Nustep L5 8 min Seated hamstring stretch 30s x2 B Prone press up 10x2 Supine hip fallouts BluTB 15x B, 15/15 unilaterally  [facility] Adult PT Treatment:                                                DATE: 07/26/22 Therapeutic Exercise: Nustep L4 8 min Prone prop 2 min Prone press 10x2 PPT 3x 10x PPT with march 10/10 Manual Therapy: STM R parspinals Manual assist during prone press to emphasize trunk extension   [facility] Adult PT Treatment:                                                DATE: 07/19/22 Therapeutic Exercise: Seated hamstring stretch 30s x2 QL stretch 30s x2 B  Curl up L1 15x  PPT 3s 10x PPT with alternating march 10x PPT with alternating OH flexion 10x Bridge with ball 10x Open book    [facility] Adult PT Treatment:                                                DATE: 06/29/22 Eval and re-assessment   [facility] Adult PT Treatment:                                                DATE: 05/24/22 Therapeutic Exercise: Nustep L6 x 6 min UEs/LEs Side step green TB x3 at counter Standing hip ext at counter GTB at ankles x10 BIL Palloff press 7# 2x10 BIL Shoulder ext 2 7# cables 2x10 - cues for core activation  Supine PPT 5" hold x10 Bridges 2x10 Supine figure 4 piriformis stretch 2x30" BIL push/pull Supine hamstring stretch x30" BIL  PATIENT EDUCATION:  Education details: Exam  findings, POC, aquatics, initial HEP Person educated: Patient Education method: Explanation, Demonstration, and Handouts Education comprehension: verbalized understanding, returned demonstration, and needs further education  HOME EXERCISE PROGRAM: Access Code: [email or web address] URL: [email or web address] Date: 08/16/2022 Prepared by: Gustavus Bryant  Exercises - Supine Posterior Pelvic Tilt  - 2 x daily - 7 x weekly - 2 sets - 10 reps - Supine March with Posterior Pelvic Tilt  - 2 x daily - 7 x weekly - 2 sets - 10 reps - Hooklying Single Leg Bent Knee Fallouts with Resistance  - 2 x daily - 5 x weekly - 3 sets - 15 reps - Supine Bridge with Resistance Band  - 2 x daily - 5 x weekly - 2 sets - 15 reps - Curl Up with Arms Crossed  - 2 x daily - 5 x weekly - 2 sets - 15 reps - Supine Quadratus Lumborum Stretch  - 2 x daily - 5 x weekly - 1 sets - 2 reps - 30s hold  ASSESSMENT:  CLINICAL IMPRESSION: Patient has reached maximum potential from OPPT at this time.  See goal section for progress and remaining deficits.  (Re-assess)Patient returns to outpatient physical therapy to resume plan of care to address low back pain.  Since last visit patient reports symptoms have greatly improved.  She continues to experience pain with prolonged positioning or when performing prolonged activities.  Today's assessment found good range of motion throughout bilateral lower extremities including hips.  No hamstring tightness was detected in all neuro tension signs were unremarkable.  Patient does show marked restrictions in trunk mobility demonstrating a soft tissue restriction pattern is mobility limited throughout.  Spring testing of lumbar spine elicited symptoms and found decreased intersegmental mobility.  Soft tissue restrictions as well as myofascial limitations were noted throughout lumbosacral area.  Taut bands were also detected throughout paraspinals right being greater than left.  Patient has been  recommended to undergo epidural steroid injections however initial claim has been denied due to lack of conservative treatment documentation to date.  Patient is a good candidate to resume physical therapy and may benefit from trigger point dry needling to address muscular tension.    OBJECTIVE IMPAIRMENTS: Abnormal gait, decreased balance, decreased endurance, decreased mobility, difficulty walking, decreased strength, increased fascial restrictions, increased muscle spasms, impaired flexibility, impaired UE functional use, improper body mechanics, postural dysfunction, and pain.   ACTIVITY LIMITATIONS: carrying, lifting, bending, standing, sleeping, stairs, transfers, bed mobility, toileting, dressing, hygiene/grooming, locomotion level, and caring for others  PARTICIPATION LIMITATIONS: meal prep, cleaning, laundry, shopping, community activity, occupation, and yard work  PERSONAL FACTORS: Fitness, Past/current experiences, and Time since onset of injury/illness/exacerbation are also affecting patient's functional outcome.   REHAB POTENTIAL: Good  CLINICAL DECISION MAKING: Evolving/moderate complexity  EVALUATION COMPLEXITY: Moderate   GOALS: Goals reviewed with patient? Yes  SHORT TERM GOALS: Target date: 07/20/2022   Pt will be ind with initial HEP Baseline:[email or web address] Goal status: MET  2.  Pt will be ind with maintaining proper lifting mechanics for work and home tasks Baseline: TBD Goal status: Met and reviewed     LONG TERM GOALS: Target date: 08/20/2022   Pt will be ind with management and progression of HEP Baseline: [id number] Goal status: Met  2.  Pt will maintain SLS for at least 37 sec R&L to be within age norms to demo improved LE and core stability Baseline: R 37s, L 15s Goal status: Not met  3.  Pt  will report reduced pain by >/=50% Baseline: 8 initial, now 5 Goal status: Partially met  4.  Pt will have 10% improvement in lumbar ROM with </=2/10 pain Baseline:   AROM eval 06/29/22 08/16/22  Flexion 60% 75% 90%  Extension 25%* 25% 25%  Right lateral flexion 50%* 50% 75%  Left lateral flexion 60%* 50% 75%  Right rotation 25% 10% 75%  Left rotation 25% 25% 90%   Goal status: Met  5.  Pt will have improved FOTO to >/=54 Baseline: 44; 08/16/22 37 Goal status: Not met  PLAN:  PT FREQUENCY: 1-2x/week  PT DURATION: 8 weeks  PLANNED INTERVENTIONS: Therapeutic exercises, Therapeutic activity, Neuromuscular re-education, Balance training, Gait training, Patient/Family education, Self Care, Joint mobilization, Stair training, Aquatic Therapy, Dry Needling, Electrical stimulation, Spinal manipulation, Spinal mobilization, Cryotherapy, Moist heat, scar mobilization, Taping, Traction, Ionotophoresis 4mg /ml Dexamethasone, Manual therapy, and Re-evaluation.  PLAN FOR NEXT SESSION: Assess response to HEP. Continue core and hip strengthening. Work on Architectural technologist.    Hildred Laser, PT 08/16/2022, 3:41 PM  Check all possible CPT codes: 45409 - PT Re-evaluation, 97110- Therapeutic Exercise, [phone]- Neuro Re-education, [phone] - Gait Training, [phone] - Manual Therapy, [phone] - Therapeutic Activities, and [phone] - Self Care    Check all conditions that are expected to impact treatment: {Conditions expected to impact treatment:Musculoskeletal disorders and Contractures, spasticity or fracture relevant to requested treatment   If treatment provided at initial evaluation, no treatment charged due to lack of authorization.

## 2022-08-16 ENCOUNTER — Ambulatory Visit: Payer: Medicaid Other | Attending: Physical Medicine & Rehabilitation

## 2022-08-16 DIAGNOSIS — M5459 Other low back pain: Secondary | ICD-10-CM | POA: Diagnosis present

## 2022-08-16 DIAGNOSIS — M5431 Sciatica, right side: Secondary | ICD-10-CM | POA: Insufficient documentation

## 2022-08-16 DIAGNOSIS — M6281 Muscle weakness (generalized): Secondary | ICD-10-CM | POA: Insufficient documentation

## 2022-08-17 ENCOUNTER — Ambulatory Visit (INDEPENDENT_AMBULATORY_CARE_PROVIDER_SITE_OTHER): Payer: Medicaid Other | Admitting: Clinical

## 2022-08-17 DIAGNOSIS — F331 Major depressive disorder, recurrent, moderate: Secondary | ICD-10-CM | POA: Diagnosis not present

## 2022-08-18 NOTE — Progress Notes (Signed)
   THERAPIST PROGRESS NOTE Virtual Visit via Video Note  I connected with Stacy Barrett on 08/17/2022 at  2:00 PM EDT by a video enabled telemedicine application and verified that I am speaking with the correct person using two identifiers.  Location: Patient: home Provider: office   I discussed the limitations of evaluation and management by telemedicine and the availability of in person appointments. The patient expressed understanding and agreed to proceed.   Follow Up Instructions: I discussed the assessment and treatment plan with the patient. The patient was provided an opportunity to ask questions and all were answered. The patient agreed with the plan and demonstrated an understanding of the instructions.   The patient was advised to call back or seek an in-person evaluation if the symptoms worsen or if the condition fails to improve as anticipated.   Session Time: 40 minutes  Participation Level: Active  Behavioral Response: CasualAlertDepressed  Type of Therapy: Individual Therapy  Treatment Goals addressed: client will complete 80% of assigned homework  ProgressTowards Goals: Progressing  Interventions: CBT and Supportive  Summary:  Stacy Barrett is a 57 y.o. female who presents for the scheduled appointment oriented times five, appropriately dressed and friendly. Client denied hallucinations and delusions. Client reported she is fairly okay. Client reported she has been helping a friend move and it has been nice. Client reported this is a friend she would also see when visiting her female friend whom she had to cut off. Client reported she has been making herself get out more. Client reported she is putting herself in the mindset of getting in touch with her purpose in life. Client reported when the time comes her mother passes she doe not want to feel stuck due to keeping herself isolated. Client reported the ordeal with her female friend had triggered thoughts about  her marriage. Client reported it relates to feeling betrayed by someone who she did not treat wrong. Client reported it had been years she did not have a thought about her ex husband but now she is having nightmares again.  Evidence of progress towards goal:   client reported positive behavioral activation at least 1x per week by going outside of the house.   Suicidal/Homicidal: Nowithout intent/plan  Therapist Response:  Therapist began the appointment asking the client how she has been doing. Therapist used cbt to engage using active listening and positive emotional support. Therapist used cbt to engage and ask the client about her thoughts and feelings pertaining to moving on from the traumatic friendship. Therapist used cbt to discuss boundaries, keeping herself engaged in daily activity and challenging negative thoughts. Therapist used CBT ask the client to identify her progress with frequency of use with coping skills with continued practice in her daily activity.       Plan: Return again in 4 weeks.  Diagnosis: moderate episode of recurrent major depressive disorder  Collaboration of Care: Patient refused AEB none requested by the client.  Patient/Guardian was advised Release of Information must be obtained prior to any record release in order to collaborate their care with an outside provider. Patient/Guardian was advised if they have not already done so to contact the registration department to sign all necessary forms in order for [place] to release information regarding their care.   Consent: Patient/Guardian gives verbal consent for treatment and assignment of benefits for services provided during this visit. Patient/Guardian expressed understanding and agreed to proceed.   Neena Rhymes Schuyler Behan, LCSW 08/17/2022

## 2022-09-01 ENCOUNTER — Other Ambulatory Visit: Payer: Self-pay | Admitting: Family Medicine

## 2022-09-01 DIAGNOSIS — I10 Essential (primary) hypertension: Secondary | ICD-10-CM

## 2022-09-01 DIAGNOSIS — E7849 Other hyperlipidemia: Secondary | ICD-10-CM

## 2022-09-13 ENCOUNTER — Encounter: Payer: Self-pay | Admitting: Physical Medicine & Rehabilitation

## 2022-09-13 ENCOUNTER — Encounter: Payer: Medicaid Other | Admitting: Physical Medicine & Rehabilitation

## 2022-09-13 VITALS — BP 159/100 | HR 95 | Ht 65.0 in | Wt 201.0 lb

## 2022-09-13 DIAGNOSIS — M5416 Radiculopathy, lumbar region: Secondary | ICD-10-CM | POA: Insufficient documentation

## 2022-09-13 NOTE — Progress Notes (Signed)
   Subjective:    Patient ID: Stacy Barrett, female    DOB: 05/30/1965, 57 y.o.   MRN: [id number]  HPI    PROCEDURE RECORD [facility]   Name: Stacy Barrett DOB:11/09/1965 MRN: [id number]  Date:09/13/2022  Physician: Claudette Laws, MD    Nurse/CMA:    Allergies:  Allergies  Allergen Reactions   Other Other (See Comments)    MSG -Very intense headaches   Penicillin G Other (See Comments)    Welps   Penicillins Hives, Other (See Comments) and Rash    Welps  Welps  Welps   Prednisone Itching, Rash and Swelling    Swelling of lips and face  Swelling of lips and face  Swelling of lips and face   Sulfa Antibiotics Hives, Itching and Other (See Comments)    Welps   Sulfamethoxazole     Other Reaction(s): Other (See Comments)  Welps   Monosodium Glutamate Other (See Comments) and Swelling   Hydrocodone Swelling    Noted swelling of tongue, knees, fingers and toes.  Transient.  No throat swelling.  Noted swelling of tongue, knees, fingers and toes.  Transient.  No throat swelling.  Noted swelling of tongue, knees, fingers and toes.  Transient.  No throat swelling.    Consent Signed: Yes.    Is patient diabetic? No.  CBG today? N/A  Pregnant: No. LMP: No LMP recorded. (age 20-55)  Anticoagulants: no Anti-inflammatory: no Antibiotics: no  Procedure:    Position:  Start Time:   End Time:    Fluoro Time:    RN/CMA Blayne Garlick MA     Time 3:04 PM     BP 147/98     Pulse 98     Respirations 16     O2 Sat 95     S/S 6     Pain Level 8/10       D/C home with  , patient A & O X 3, D/C instructions reviewed, and sits independently.        Review of Systems     Objective:   Physical Exam        Assessment & Plan:  See progress note procedure cancelled as above

## 2022-09-13 NOTE — Progress Notes (Signed)
Discussed potential for cross reaction between dexamethasone used for transforaminal ESI and predinisone (which has caused swelling in this pt ).  Offered diagnostic SNRB at Right L 3-4 level .  Pt would like more time to think about it .  Pt to call us for appt

## 2022-09-14 ENCOUNTER — Telehealth: Payer: Self-pay

## 2022-09-14 DIAGNOSIS — M5416 Radiculopathy, lumbar region: Secondary | ICD-10-CM

## 2022-09-14 NOTE — Telephone Encounter (Signed)
Patient asked what is the next step for her. She did not get her injection yesterday.

## 2022-09-16 NOTE — Telephone Encounter (Signed)
She said ok. Can you refer her or does she need to go back to PCP for referral?

## 2022-09-21 ENCOUNTER — Telehealth (HOSPITAL_COMMUNITY): Payer: Medicaid Other | Admitting: Psychiatry

## 2022-09-21 ENCOUNTER — Encounter (HOSPITAL_COMMUNITY): Payer: Self-pay | Admitting: Psychiatry

## 2022-09-21 DIAGNOSIS — Z720 Tobacco use: Secondary | ICD-10-CM | POA: Diagnosis not present

## 2022-09-21 DIAGNOSIS — F331 Major depressive disorder, recurrent, moderate: Secondary | ICD-10-CM

## 2022-09-21 DIAGNOSIS — F411 Generalized anxiety disorder: Secondary | ICD-10-CM | POA: Diagnosis not present

## 2022-09-21 DIAGNOSIS — F1220 Cannabis dependence, uncomplicated: Secondary | ICD-10-CM | POA: Diagnosis not present

## 2022-09-21 MED ORDER — ARIPIPRAZOLE 2 MG PO TABS
2.0000 mg | ORAL_TABLET | Freq: Every day | ORAL | 3 refills | Status: DC
Start: 2022-09-21 — End: 2022-12-14

## 2022-09-21 MED ORDER — SERTRALINE HCL 100 MG PO TABS: 150.0000 mg | ORAL_TABLET | Freq: Every day | ORAL | 3 refills | Status: DC

## 2022-09-21 MED ORDER — BUSPIRONE HCL 15 MG PO TABS
15.0000 mg | ORAL_TABLET | Freq: Two times a day (BID) | ORAL | 3 refills | Status: DC
Start: 2022-09-21 — End: 2022-12-14

## 2022-09-21 MED ORDER — GABAPENTIN 300 MG PO CAPS
ORAL_CAPSULE | ORAL | 3 refills | Status: DC
Start: 2022-09-21 — End: 2022-12-14

## 2022-09-21 MED ORDER — NICOTINE 14 MG/24HR TD PT24
14.0000 mg | MEDICATED_PATCH | Freq: Every day | TRANSDERMAL | 3 refills | Status: DC
Start: 2022-09-21 — End: 2022-12-14

## 2022-09-21 NOTE — Progress Notes (Signed)
BH MD/PA/NP OP Progress Note Virtual Visit via Video Note  I connected with Stacy Barrett on 09/21/22 at  9:00 AM EDT by a video enabled telemedicine application and verified that I am speaking with the correct person using two identifiers.  Location: Patient: Home Provider: Clinic   I discussed the limitations of evaluation and management by telemedicine and the availability of in person appointments. The patient expressed understanding and agreed to proceed.  I provided 30 minutes of non-face-to-face time during this encounter.    09/21/2022 9:40 AM Stacy Barrett  MRN:  [id number]  Chief Complaint: "I am not doing well"  HPI: 57 year old female seen today for follow-up psychiatric evaluation.  She has a psychiatric history of anxiety, marijuana dependence, depression, panic disorder, and tobacco dependence.  She is currently managed on Abilify 2 mg daily, BuSpar 15 mg twice daily, gabapentin 300 mg 3 times daily (reports that she takes it twice daily), and Zoloft 150 mg daily.  She informed Clinical research associate that that her medications are somewhat effective in managing her psychiatric addition.  Today she logged in virtually but her microphone went in and out. She informed Clinical research associate that she is not doing well. She reports that recently she has been having issues and having a hard time getting around. She reports that she continues to have back pain. She went to [facility] and notes that she was unable to get her cortisone injection. She repots that in the past she had a bad reaction to prednisone and notes that for safety her provider decided not to give her her cortisone injection. She has been referred to orthopedic spine and may require surgery. Today she quantifies her pain as 7/10.  Patient notes that she feels emotionally up and down. She notes that recently her friend and her friends daughter moved in with her. She notes that this has been beneficial for her mental health as it allowed  her not to be isolated and have socialization.  She reports that she dreads them moving out in a few months. Patient notes that her mother is reliant on her for everything which she feels is overwhelming. Provider gave patient numbers to home health agencies.  Since her last visit she notes that her anxiety and depression has worsened. Today provider conducted a GAD-7 and patient scored an 16, at her last visit she scored a 8.  Provider also conducted PHQ-9 patient scored an 16, at her last visit she scored a 11.  She reports sleeping 5 hours nightly.  She reports her appetite is poor. She notes that within the last 6 months (since starting Abilify she has gained 20 pounds).  Today she endorses passive SI but denies wanting to harm herself. She denies SI/HI/VAH, mania, paranoia.    Patient inform her that she uses marijuana daily to cope with above stressors.  She notes that she buys it legally from a dispensary.  Provide inform patient that marijuana can exacerbate her mental health.  She notes that when she does not have that she is more anxious.  Patient informed Clinical research associate that she smokes half a pack of cigarettes a day.  At this time she wishes to restart patches. She is also agreeable to taking Gabapentin 300 mg three times daily instead of twice daily to help manage mood, anxiety, and pain. At this time she doe not want Abilify increased due to potential weight gain. Provider discussed adding a small dose of Cymbalta at next visit if pain, anxiety, or  depression does not improved. Provider discussed risk and benefits of being on two antidepressants.  She will continue all other medication as prescribed.  No other concerns at this time.  Visit Diagnosis:    ICD-10-CM   1. Moderate episode of recurrent major depressive disorder (HCC)  F33.1 gabapentin (NEURONTIN) 300 MG capsule    busPIRone (BUSPAR) 15 MG tablet    ARIPiprazole (ABILIFY) 2 MG tablet    sertraline (ZOLOFT) 100 MG tablet    2. GAD  (generalized anxiety disorder)  F41.1 gabapentin (NEURONTIN) 300 MG capsule    busPIRone (BUSPAR) 15 MG tablet    sertraline (ZOLOFT) 100 MG tablet    3. Tobacco abuse  Z72.0 nicotine (NICODERM CQ - DOSED IN MG/24 HOURS) 14 mg/24hr patch    4. Marijuana dependence (HCC)  F12.20        Past Psychiatric History: anxiety, depression, panic disorder, marijuana dependence, and tobacco dependence  Past Medical History:  Past Medical History:  Diagnosis Date   Anxiety    Depression    Edema    Hypertension    Lumbar radiculopathy    Pituitary adenoma (HCC)    Vertebral fracture    History reviewed. No pertinent surgical history.  Family Psychiatric History: Na  Family History:  Family History  Problem Relation Age of Onset   CAD Mother     Social History:  Social History   Socioeconomic History   Marital status: Divorced    Spouse name: Not on file   Number of children: Not on file   Years of education: Not on file   Highest education level: Not on file  Occupational History   Not on file  Tobacco Use   Smoking status: Every Day    Current packs/day: 0.50    Types: Cigarettes   Smokeless tobacco: Never  Vaping Use   Vaping status: Former   Substances: THC, CBD  Substance and Sexual Activity   Alcohol use: Yes    Alcohol/week: 1.0 standard drink of alcohol    Types: 1 Glasses of wine per week    Comment: "hardly ever"   Drug use: Yes    Frequency: 2.0 times per week    Types: Marijuana   Sexual activity: Not on file  Other Topics Concern   Not on file  Social History Narrative   Not on file   Social Determinants of Health   Financial Resource Strain: Not on file  Food Insecurity: Not on file  Transportation Needs: Not on file  Physical Activity: Not on file  Stress: Not on file  Social Connections: Not on file    Allergies:  Allergies  Allergen Reactions   Other Other (See Comments)    MSG -Very intense headaches   Penicillin G Other (See  Comments)    Welps   Penicillins Hives, Other (See Comments) and Rash    Welps  Welps  Welps   Prednisone Itching, Rash and Swelling    Swelling of lips and face  Swelling of lips and face  Swelling of lips and face   Sulfa Antibiotics Hives, Itching and Other (See Comments)    Welps   Sulfamethoxazole     Other Reaction(s): Other (See Comments)  Welps   Monosodium Glutamate Other (See Comments) and Swelling   Hydrocodone Swelling    Noted swelling of tongue, knees, fingers and toes.  Transient.  No throat swelling.  Noted swelling of tongue, knees, fingers and toes.  Transient.  No throat swelling.  Noted  swelling of tongue, knees, fingers and toes.  Transient.  No throat swelling.    Metabolic Disorder Labs: Lab Results  Component Value Date   HGBA1C 5.7 (H) 10/08/2013   MPG 117 (H) 10/08/2013   Lab Results  Component Value Date   PROLACTIN 37.5 (H) 10/28/2015   PROLACTIN 36.8 07/18/2014   Lab Results  Component Value Date   CHOL 155 05/22/2017   TRIG 124 05/22/2017   HDL 47 05/22/2017   CHOLHDL 3.3 05/22/2017   VLDL 60 (H) 10/28/2015   LDLCALC 83 05/22/2017   LDLCALC 86 06/14/2016   Lab Results  Component Value Date   TSH 1.270 12/16/2019   TSH 1.130 12/06/2017    Therapeutic Level Labs: Lab Results  Component Value Date   LITHIUM <0.25 (L) 10/03/2013   No results found for: "VALPROATE" No results found for: "CBMZ"  Current Medications: Current Outpatient Medications  Medication Sig Dispense Refill   ALPRAZolam (XANAX) 0.5 MG tablet Take by mouth. (Patient not taking: Reported on 09/13/2022)     ARIPiprazole (ABILIFY) 2 MG tablet Take 1 tablet (2 mg total) by mouth daily. 30 tablet 3   atorvastatin (LIPITOR) 20 MG tablet TAKE 1 TABLET (20 MG TOTAL) BY MOUTH DAILY. 30 tablet 0   busPIRone (BUSPAR) 15 MG tablet Take 1 tablet (15 mg total) by mouth 2 (two) times daily. 30 tablet 3   cetirizine (ZYRTEC) 10 MG tablet Take 1 tablet (10 mg total) by  mouth daily. 90 tablet 3   clonazePAM (KLONOPIN) 0.5 MG tablet Take by mouth.     fluticasone (FLONASE) 50 MCG/ACT nasal spray Place 2 sprays into both nostrils daily. 48 g 3   furosemide (LASIX) 20 MG tablet TAKE 1 TABLET (20 MG TOTAL) BY MOUTH DAILY AS NEEDED FOR EDEMA. 30 tablet 0   gabapentin (NEURONTIN) 300 MG capsule TAKE 1 CAPSULE BY MOUTH THREE TIMES A DAY 90 capsule 3   hydrochlorothiazide (HYDRODIURIL) 25 MG tablet Take 1 tablet by mouth daily.     lisinopril (ZESTRIL) 10 MG tablet TAKE 1 TABLET (10 MG TOTAL) BY MOUTH DAILY. 30 tablet 0   nicotine (NICODERM CQ - DOSED IN MG/24 HOURS) 14 mg/24hr patch Place 1 patch (14 mg total) onto the skin daily. 28 patch 3   sertraline (ZOLOFT) 100 MG tablet Take 1.5 tablets (150 mg total) by mouth daily. 45 tablet 3   traMADol (ULTRAM) 50 MG tablet TAKE 1 TABLET BY MOUTH DAILY 30 tablet 1   No current facility-administered medications for this visit.     Musculoskeletal: Strength & Muscle Tone:  Unable to assess, camera off,  Gait & Station:  Unable to assess, camera off,  Patient leans: N/A  Psychiatric Specialty Exam: Review of Systems  There were no vitals taken for this visit.There is no height or weight on file to calculate BMI.  General Appearance:  Unable to assess, camera off,   Eye Contact:   Unable to assess, camera off,   Speech:  Clear and Coherent and Normal Rate  Volume:  Normal  Mood:  Euthymic  Affect:  Appropriate, Congruent, and Tearful  Thought Process:  Coherent, Goal Directed, and Linear  Orientation:  Full (Time, Place, and Person)  Thought Content: WDL and Logical   Suicidal Thoughts:  Yes.  without intent/plan  Homicidal Thoughts:  No  Memory:  Immediate;   Good Recent;   Good Remote;   Good  Judgement:  Good  Insight:  Good  Psychomotor Activity:   Unable to  assess, camera off,   Concentration:  Concentration: Good and Attention Span: Good  Recall:  Good  Fund of Knowledge: Good  Language: Good   Akathisia:   Unable to assess, camera off,   Handed:  Right  AIMS (if indicated): not done  Assets:  Communication Skills Desire for Improvement Financial Resources/Insurance Housing Physical Health  ADL's:  Intact  Cognition: WNL  Sleep:  Good   Screenings: AIMS    Flowsheet Row Video Visit from 04/20/2020 in [facility]  AIMS Total Score 0      AUDIT    Flowsheet Row [facility]  Alcohol Use Disorder Identification Test Final Score (AUDIT) 0      GAD-7    Flowsheet Row Video Visit from 09/21/2022 in [facility] Video Visit from 07/06/2022 in [facility] Video Visit from 01/27/2022 in [facility] Office Visit from 01/25/2022 in [facility] Video Visit from 10/25/2021 in [facility]  Total GAD-7 Score 16 8 10 12 11       PHQ2-9    Flowsheet Row Video Visit from 09/21/2022 in [facility] Office Visit from 09/13/2022 in [facility] Office Visit from 07/22/2022 in [facility] Video Visit from 07/06/2022 in [facility] Office Visit from 04/28/2022 in [facility]  PHQ-2 Total Score 4 0 2 2 2   PHQ-9 Total Score 16 -- -- 11 --      Flowsheet Row Video Visit from 07/06/2022 in [facility] Video Visit from 01/27/2022 in [facility] Video Visit from 10/25/2021 in [facility]  C-SSRS RISK CATEGORY Error: Q7 should not be populated when Q6 is No Error: Q7 should not be populated when Q6 is No Error: Q7 should not be populated when Q6 is No        Assessment and Plan: Patient endorses increased  anxiety, depression, tobacco dependence, and marijuana. Patient informed Clinical research associate that she smokes half a pack of cigarettes a day.  At this time she wishes to restart patches. She is also agreeable to taking Gabapentin 300 mg three times daily instead of twice daily to help manage mood, anxiety, and pain. At this time she doe not want Abilify increased due to potential weight gain. Provider discussed adding a small dose of Cymbalta at next visit if pain, anxiety, or depression does not improved. Provider discussed risk and benefits of being on two antidepressants.  She will continue all other medication as prescribed. 1. Moderate episode of recurrent major depressive disorder (HCC)  Increased- gabapentin (NEURONTIN) 300 MG capsule; TAKE 1 CAPSULE BY MOUTH THREE TIMES A DAY  Dispense: 90 capsule; Refill: 3 Continue- busPIRone (BUSPAR) 15 MG tablet; Take 1 tablet (15 mg total) by mouth 2 (two) times daily.  Dispense: 30 tablet; Refill: 3 Continue- ARIPiprazole (ABILIFY) 2 MG tablet; Take 1 tablet (2 mg total) by mouth daily.  Dispense: 30 tablet; Refill: 3 Continue- sertraline (ZOLOFT) 100 MG tablet; Take 1.5 tablets (150 mg total) by mouth daily.  Dispense: 45 tablet; Refill: 3  2. GAD (generalized anxiety disorder)  Increased- gabapentin (NEURONTIN) 300 MG capsule; TAKE 1 CAPSULE BY MOUTH THREE TIMES A DAY  Dispense: 90 capsule; Refill: 3 Continue- busPIRone (BUSPAR) 15 MG tablet; Take  1 tablet (15 mg total) by mouth 2 (two) times daily.  Dispense: 30 tablet; Refill: 3 Continue- sertraline (ZOLOFT) 100 MG tablet; Take 1.5 tablets (150 mg total) by mouth daily.  Dispense: 45 tablet; Refill: 3 3. Tobacco abuse  Restart- nicotine (NICODERM CQ - DOSED IN MG/24 HOURS) 14 mg/24hr patch; Place 1 patch (14 mg total) onto the skin daily.  Dispense: 28 patch; Refill: 3 4. Marijuana dependence (HCC)      Follow-up in 3 months Follow-up with therapy Shanna Cisco, NP 09/21/2022, 9:40 AM

## 2022-09-21 NOTE — Telephone Encounter (Signed)
Patient aware.

## 2022-09-29 ENCOUNTER — Ambulatory Visit (HOSPITAL_COMMUNITY): Payer: Medicaid Other | Admitting: Clinical

## 2022-09-29 ENCOUNTER — Encounter (HOSPITAL_COMMUNITY): Payer: Self-pay

## 2022-10-10 ENCOUNTER — Ambulatory Visit (HOSPITAL_COMMUNITY): Payer: Medicaid Other | Admitting: Clinical

## 2022-10-10 DIAGNOSIS — F331 Major depressive disorder, recurrent, moderate: Secondary | ICD-10-CM | POA: Diagnosis not present

## 2022-10-10 NOTE — Progress Notes (Signed)
THERAPIST PROGRESS NOTE Virtual Visit via Video Note  I connected with Stacy Barrett on 10/10/22 at 10:00 AM EDT by a video enabled telemedicine application and verified that I am speaking with the correct person using two identifiers.  Location: Patient: home Provider: office   I discussed the limitations of evaluation and management by telemedicine and the availability of in person appointments. The patient expressed understanding and agreed to proceed.   Follow Up Instructions: I discussed the assessment and treatment plan with the patient. The patient was provided an opportunity to ask questions and all were answered. The patient agreed with the plan and demonstrated an understanding of the instructions.   The patient was advised to call back or seek an in-person evaluation if the symptoms worsen or if the condition fails to improve as anticipated.  Session Time: 50 minutes  Participation Level: Active  Behavioral Response: CasualAlertDepressed  Type of Therapy: Individual Therapy  Treatment Goals addressed: client will complete 80% of assigned homework  ProgressTowards Goals: Progressing  Interventions: CBT and Supportive  Summary:  Stacy Barrett is a 57 y.o. female who presents for the scheduled appointment oriented x 5, appropriately dressed, and friendly.  Client denied hallucinations and delusions. Client reported she has been struggling with depressive symptoms.  Client reported she has spent over the last 10 years caretaking for her mother who is completely dependent upon her due to a traumatic brain injury and she has decompensated in a way that she requires someone to bathe her and help her to go to the bathroom.  Client reported when she was younger she had toward her diet and after he left her to care for her mother on her own they became estranged.  Client reported when her dad passed away she was not able to reconcile with him before he passed. Client reported  she has not been able to work and has had minimal interaction socially.  Client reported her main friendship became toxic to her to her friend's issues with her own mental health. Client reported she does not want to go back to the state of complete shutdown and succumbing to depressive thoughts. Client reported her protective factors have been her faith in God. Evidence of progress towards goal:  client reported 1 cognitive pattern related to depression such as feeling overwhelmed with psychosocial stressors.   Suicidal/Homicidal: Nowithout intent/plan  Therapist Response:  Therapist began the appointment asking the client how she has been doing. Therapist used cbt to engage using active listening and positive emotional support. Therapist used cbt to engage ask the client about current severity of her depressive symptoms. Therapist used cbt to normalize the client emotions within reason and engage with her to discuss prioritizing her needs and steps to help improve her needs. Therapist completed SDOH. Therapist used CBT ask the client to identify her progress with frequency of use with coping skills with continued practice in her daily activity.    Client homework is to call DSS to inquire for an appt to speak with a DSS casework to discuss signing her mother up for medicaid.   Plan: Return again in 4 weeks.  Diagnosis: moderate episode of recurrent major depressive disorder  Collaboration of Care: Patient refused AEB none requested by the client.  Patient/Guardian was advised Release of Information must be obtained prior to any record release in order to collaborate their care with an outside provider. Patient/Guardian was advised if they have not already done so to contact the registration  department to sign all necessary forms in order for [place] to release information regarding their care.   Consent: Patient/Guardian gives verbal consent for treatment and assignment of benefits for services  provided during this visit. Patient/Guardian expressed understanding and agreed to proceed.   Neena Rhymes Toua Stites, LCSW 10/10/2022

## 2022-10-12 ENCOUNTER — Ambulatory Visit (INDEPENDENT_AMBULATORY_CARE_PROVIDER_SITE_OTHER): Payer: Medicaid Other | Admitting: Orthopedic Surgery

## 2022-10-12 ENCOUNTER — Other Ambulatory Visit (HOSPITAL_COMMUNITY): Payer: Self-pay

## 2022-10-12 ENCOUNTER — Other Ambulatory Visit (INDEPENDENT_AMBULATORY_CARE_PROVIDER_SITE_OTHER): Payer: Medicaid Other

## 2022-10-12 ENCOUNTER — Encounter: Payer: Self-pay | Admitting: Orthopedic Surgery

## 2022-10-12 VITALS — Ht 65.0 in | Wt 201.0 lb

## 2022-10-12 DIAGNOSIS — M5416 Radiculopathy, lumbar region: Secondary | ICD-10-CM | POA: Diagnosis not present

## 2022-10-12 DIAGNOSIS — M545 Low back pain, unspecified: Secondary | ICD-10-CM

## 2022-10-12 NOTE — Progress Notes (Signed)
Orthopedic Spine Surgery Office Note  Assessment: Patient is a 57 y.o. female with low back pain that radiates into the bilateral buttock and posterior thighs.  Has a spondylolisthesis and foraminal stenosis at L5/S1   Plan: -Patient has tried Tylenol, ibuprofen, PT -Recommended EMG/NCS to workup her radicular type pain further since we cannot use a diagnostic injection due to her prednisone allergy -Went over the fact that she would need to quit nicotine containing products prior to any elective spine surgery -Patient should return to office in 4-5 weeks, x-rays at next visit: none   Patient expressed understanding of the plan and all questions were answered to the patient's satisfaction.   ___________________________________________________________________________   History:  Patient is a 57 y.o. female who presents today for lumbar spine.  Patient has had several years of low back pain that has gotten progressively worse with time.  She has now developed pain that radiates into bilateral lower extremities.  She feels it going into her bilateral buttock and posterior thighs.  It does not radiate past the knees.  She feels that her legs have gotten weaker over time.  There is no trauma or injury that preceded the onset of pain.   Weakness: Yes, both legs feel weaker generally.  No other weakness noted Symptoms of imbalance: Denies Paresthesias and numbness: Denies Bowel or bladder incontinence: Yes, has stress incontinence.  No other incontinence.  No recent changes to bowel or bladder habits. Saddle anesthesia: Denies  Treatments tried: Ibuprofen, Tylenol, PT  Review of systems: Denies fevers and chills, night sweats, unexplained weight loss, history of cancer, pain that wakes them at night  Past medical history: HLD HTN Depression/anxiety Irritable bowel syndrome Chronic pain  Allergies: Penicillin, prednisone, sulfa, hydrocodone  Past surgical history:  [facility] eye  surgery  Social history: Reports use of nicotine product (smoking, vaping, patches, smokeless) Alcohol use: denies Reports medical marijuana use, denies recreational drug use   Physical Exam:  BMI of 33.5  General: no acute distress, appears stated age Neurologic: alert, answering questions appropriately, following commands Respiratory: unlabored breathing on room air, symmetric chest rise Psychiatric: appropriate affect, normal cadence to speech   MSK (spine):  -Strength exam      Left  Right EHL    5/5  5/5 TA    5/5  5/5 GSC    5/5  5/5 Knee extension  5/5  5/5 Hip flexion   5/5  5/5  -Sensory exam    Sensation intact to light touch in L3-S1 nerve distributions of bilateral lower extremities  -Achilles DTR: 2/4 on the left, 2/4 on the right -Patellar tendon DTR: 2/4 on the left, 2/4 on the right  -Straight leg raise: Negative bilaterally -Femoral nerve stretch test: Negative bilaterally -Clonus: no beats bilaterally  -Left hip exam: No pain through range of motion, negative Stinchfield, negative FABER -Right hip exam: No pain through range of motion, negative Stinchfield, negative FABER  Imaging: XRs of the lumbar spine from 10/12/2022 were independently reviewed and interpreted, showing lumbar curvature with apex to the right that measures 16.5 degrees.  Disc height loss at L2/3, L3/4, L4/5.  No fracture or dislocation seen.  Spondylolisthesis seen at L5/S1.  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  DDD at L2/3, L3/4, L4/5, L5/S1.  Bilateral foraminal stenosis at L3/4 and L5/S1.  No other significant stenosis seen.   Patient name: Stacy Barrett Patient MRN: [id number] Date of  visit: 10/12/22

## 2022-10-13 ENCOUNTER — Other Ambulatory Visit (HOSPITAL_COMMUNITY): Payer: Self-pay

## 2022-10-13 ENCOUNTER — Encounter: Payer: Self-pay | Admitting: Pharmacist

## 2022-10-13 ENCOUNTER — Other Ambulatory Visit: Payer: Self-pay

## 2022-10-19 ENCOUNTER — Other Ambulatory Visit: Payer: Self-pay

## 2022-10-20 ENCOUNTER — Ambulatory Visit (HOSPITAL_COMMUNITY): Payer: No Payment, Other | Admitting: Clinical

## 2022-11-02 ENCOUNTER — Ambulatory Visit (INDEPENDENT_AMBULATORY_CARE_PROVIDER_SITE_OTHER): Payer: Medicaid Other | Admitting: Clinical

## 2022-11-02 ENCOUNTER — Encounter (HOSPITAL_COMMUNITY): Payer: Self-pay

## 2022-11-02 DIAGNOSIS — F331 Major depressive disorder, recurrent, moderate: Secondary | ICD-10-CM

## 2022-11-14 NOTE — Progress Notes (Signed)
No show

## 2022-11-16 ENCOUNTER — Other Ambulatory Visit: Payer: Self-pay | Admitting: Family Medicine

## 2022-11-16 DIAGNOSIS — I10 Essential (primary) hypertension: Secondary | ICD-10-CM

## 2022-11-17 NOTE — Telephone Encounter (Signed)
Requested medications are due for refill today.  yes  Requested medications are on the active medications list.  yes  Last refill. 09/02/2022  Future visit scheduled.   no  Notes to clinic.  Erin Ringley listed as pcp. Pt is more than  3 months over due for an OV. Labs are expired.    Requested Prescriptions  Pending Prescriptions Disp Refills   lisinopril (ZESTRIL) 10 MG tablet [Pharmacy Med Name: Lisinopril 10MG  TABS] 30 tablet 0    Sig: TAKE 1 TABLET BY MOUTH DAILY     Cardiovascular:  ACE Inhibitors Failed - 11/16/2022  1:35 PM      Failed - Cr in normal range and within 180 days    Creat  Date Value Ref Range Status  10/28/2015 0.64 0.50 - 1.05 mg/dL Final    Comment:      For patients > or = 57 years of age: The upper reference limit for Creatinine is approximately 13% higher for people identified as African-American.      Creatinine, Ser  Date Value Ref Range Status  08/02/2021 1.06 (H) 0.57 - 1.00 mg/dL Final         Failed - K in normal range and within 180 days    Potassium  Date Value Ref Range Status  08/02/2021 4.5 3.5 - 5.2 mmol/L Final         Failed - Last BP in normal range    BP Readings from Last 1 Encounters:  09/13/22 (!) 159/100         Failed - Valid encounter within last 6 months    Recent Outpatient Visits           9 months ago Bilateral sciatica   [place] [facility] Hoy Register, MD   1 year ago Annual physical exam   [facility] Hoy Register, MD   1 year ago Bilateral sciatica   [place] [facility] Hoy Register, MD   2 years ago Pedal edema   [place] [facility] Hoy Register, MD   2 years ago Annual physical exam   [place] [facility] Hoy Register, MD       Future Appointments             Tomorrow Christell Constant Tyrone Apple, MD [facility] - Patient is not pregnant

## 2022-11-18 ENCOUNTER — Ambulatory Visit: Payer: Medicaid Other | Admitting: Orthopedic Surgery

## 2022-12-14 ENCOUNTER — Telehealth (HOSPITAL_COMMUNITY): Payer: Medicaid Other | Admitting: Psychiatry

## 2022-12-14 ENCOUNTER — Encounter (HOSPITAL_COMMUNITY): Payer: Self-pay | Admitting: Psychiatry

## 2022-12-14 DIAGNOSIS — F1290 Cannabis use, unspecified, uncomplicated: Secondary | ICD-10-CM

## 2022-12-14 DIAGNOSIS — F331 Major depressive disorder, recurrent, moderate: Secondary | ICD-10-CM | POA: Diagnosis not present

## 2022-12-14 DIAGNOSIS — F17210 Nicotine dependence, cigarettes, uncomplicated: Secondary | ICD-10-CM | POA: Diagnosis not present

## 2022-12-14 DIAGNOSIS — F1994 Other psychoactive substance use, unspecified with psychoactive substance-induced mood disorder: Secondary | ICD-10-CM | POA: Diagnosis not present

## 2022-12-14 DIAGNOSIS — F411 Generalized anxiety disorder: Secondary | ICD-10-CM

## 2022-12-14 DIAGNOSIS — Z720 Tobacco use: Secondary | ICD-10-CM

## 2022-12-14 MED ORDER — SERTRALINE HCL 100 MG PO TABS: 150.0000 mg | ORAL_TABLET | Freq: Every day | ORAL | 3 refills | Status: DC

## 2022-12-14 MED ORDER — GABAPENTIN 300 MG PO CAPS
ORAL_CAPSULE | ORAL | 3 refills | Status: DC
Start: 2022-12-14 — End: 2023-02-28

## 2022-12-14 MED ORDER — NICOTINE 14 MG/24HR TD PT24: 14.0000 mg | MEDICATED_PATCH | Freq: Every day | TRANSDERMAL | 3 refills | Status: DC

## 2022-12-14 MED ORDER — DULOXETINE HCL 20 MG PO CPEP: 20.0000 mg | ORAL_CAPSULE | Freq: Every day | ORAL | 3 refills | Status: DC

## 2022-12-14 MED ORDER — ARIPIPRAZOLE 5 MG PO TABS
5.0000 mg | ORAL_TABLET | Freq: Every day | ORAL | 3 refills | Status: DC
Start: 2022-12-14 — End: 2023-02-28

## 2022-12-14 MED ORDER — BUSPIRONE HCL 15 MG PO TABS
15.0000 mg | ORAL_TABLET | Freq: Two times a day (BID) | ORAL | 3 refills | Status: DC
Start: 2022-12-14 — End: 2023-02-28

## 2022-12-14 NOTE — Progress Notes (Signed)
BH MD/PA/NP OP Progress Note Virtual Visit via Video Note  I connected with Stacy Barrett on 12/14/22 at  1:00 PM EDT by a video enabled telemedicine application and verified that I am speaking with the correct person using two identifiers.  Location: Patient: Home Provider: Clinic   I discussed the limitations of evaluation and management by telemedicine and the availability of in person appointments. The patient expressed understanding and agreed to proceed.  I provided 30 minutes of non-face-to-face time during this encounter.    12/14/2022 5:37 PM Stacy Barrett  MRN:  [id number]  Chief Complaint: "I am all over the place"  HPI: 57 year old female seen today for follow-up psychiatric evaluation.  She has a psychiatric history of anxiety, marijuana dependence, depression, panic disorder, and tobacco dependence.  She is currently managed on Abilify 2 mg daily, BuSpar 15 mg twice daily, gabapentin 300 mg 3 times daily, and Zoloft 150 mg daily.  She informed Clinical research associate that that her medications are somewhat effective in managing her psychiatric addition.   Today she logged in virtually but her microphone went in and out. She informed Clinical research associate that she is all over the place. She notes that her mother recently was admitted to [facility] for CHF. She informed Clinical research associate that since her discharge her mother has not been herself. She notes that she is paranoid and has increasing dementia. Patient also notes that her friend that was living with her has moved out. She reports that this female mood swings and causes her increased stress. She notes that this person wants to rectify an argument but reports that she does not give her the opportunity to discuss it. Patient notes that her stress reation is overwhelming. She notes that when she gets so stressed she goes into survival mode and become more manic. She desribes repetitive speech, she also notes that she wanted to spend money on the internet but notes that she  did not. She also notes that she has been having racing thoughts and easily distracted. She denies VAH or paranoia.   Patient notes that she has been having foot pain which she quantifies as 5/10. She reports that her pain is not being managed as she has an allergic reaction to cortisone/prednisone injections.  Since her last visit she notes that her anxiety and depression has worsened. Today provider conducted a GAD-7 and patient scored an 17, at her last visit she scored a 16.  Provider also conducted PHQ-9 patient scored an 18, at her last visit she scored a 16.  She reports sleeping 5 hours or less hours nightly.  She reports her appetite is poor.   Today she endorses passive SI but denies wanting to harm herself. She denies SI/HI.    Patient inform her that she uses marijuana more frequently daily to cope with above stressors. Provide inform patient that marijuana can exacerbate her mental health.    Provider suggested switching antipsychotic or increasing her Abilify.  Patient notes that she does not wish to gain weight and reports that  Abilify has caused her to gain 15 pounds.  Provider informed patient that Abilify was less likely than other antipsychotics to increase weight and to inform her that her mental  state is just as important as her weight.  She endorsed understanding and was agreeable to increasing Abilify 2 mg to 5 mg to help manage mood.  She was also agreeable adding a small dose of Cymbalta to help manage pain, anxiety, or depression does not improved.  Provider discussed risk and benefits of being on two antidepressants.  She will continue all other medication as prescribed.  No other concerns at this time.  Visit Diagnosis:    ICD-10-CM   1. Moderate episode of recurrent major depressive disorder (HCC)  F33.1 ARIPiprazole (ABILIFY) 5 MG tablet    busPIRone (BUSPAR) 15 MG tablet    gabapentin (NEURONTIN) 300 MG capsule    sertraline (ZOLOFT) 100 MG tablet    DULoxetine  (CYMBALTA) 20 MG capsule    2. GAD (generalized anxiety disorder)  F41.1 busPIRone (BUSPAR) 15 MG tablet    gabapentin (NEURONTIN) 300 MG capsule    sertraline (ZOLOFT) 100 MG tablet    DULoxetine (CYMBALTA) 20 MG capsule    3. Tobacco abuse  Z72.0 nicotine (NICODERM CQ - DOSED IN MG/24 HOURS) 14 mg/24hr patch        Past Psychiatric History: anxiety, depression, panic disorder, marijuana dependence, and tobacco dependence  Past Medical History:  Past Medical History:  Diagnosis Date   Anxiety    Depression    Edema    Hypertension    Lumbar radiculopathy    Pituitary adenoma (HCC)    Vertebral fracture    History reviewed. No pertinent surgical history.  Family Psychiatric History: Na  Family History:  Family History  Problem Relation Age of Onset   CAD Mother     Social History:  Social History   Socioeconomic History   Marital status: Divorced    Spouse name: Not on file   Number of children: Not on file   Years of education: Not on file   Highest education level: Not on file  Occupational History   Not on file  Tobacco Use   Smoking status: Every Day    Current packs/day: 0.50    Types: Cigarettes   Smokeless tobacco: Never  Vaping Use   Vaping status: Former   Substances: THC, CBD  Substance and Sexual Activity   Alcohol use: Yes    Alcohol/week: 1.0 standard drink of alcohol    Types: 1 Glasses of wine per week    Comment: "hardly ever"   Drug use: Yes    Frequency: 2.0 times per week    Types: Marijuana   Sexual activity: Not on file  Other Topics Concern   Not on file  Social History Narrative   Not on file   Social Determinants of Health   Financial Resource Strain: Not on file  Food Insecurity: Not on file  Transportation Needs: Not on file  Physical Activity: Not on file  Stress: Not on file  Social Connections: Not on file    Allergies:  Allergies  Allergen Reactions   Other Other (See Comments)    MSG -Very intense  headaches   Penicillin G Other (See Comments)    Welps   Penicillins Hives, Other (See Comments) and Rash    Welps  Welps  Welps   Prednisone Itching, Rash and Swelling    Swelling of lips and face  Swelling of lips and face  Swelling of lips and face   Sulfa Antibiotics Hives, Itching and Other (See Comments)    Welps   Sulfamethoxazole     Other Reaction(s): Other (See Comments)  Welps   Monosodium Glutamate Other (See Comments) and Swelling   Hydrocodone Swelling    Noted swelling of tongue, knees, fingers and toes.  Transient.  No throat swelling.  Noted swelling of tongue, knees, fingers and toes.  Transient.  No throat  swelling.  Noted swelling of tongue, knees, fingers and toes.  Transient.  No throat swelling.    Metabolic Disorder Labs: Lab Results  Component Value Date   HGBA1C 5.7 (H) 10/08/2013   MPG 117 (H) 10/08/2013   Lab Results  Component Value Date   PROLACTIN 37.5 (H) 10/28/2015   PROLACTIN 36.8 07/18/2014   Lab Results  Component Value Date   CHOL 155 05/22/2017   TRIG 124 05/22/2017   HDL 47 05/22/2017   CHOLHDL 3.3 05/22/2017   VLDL 60 (H) 10/28/2015   LDLCALC 83 05/22/2017   LDLCALC 86 06/14/2016   Lab Results  Component Value Date   TSH 1.270 12/16/2019   TSH 1.130 12/06/2017    Therapeutic Level Labs: Lab Results  Component Value Date   LITHIUM <0.25 (L) 10/03/2013   No results found for: "VALPROATE" No results found for: "CBMZ"  Current Medications: Current Outpatient Medications  Medication Sig Dispense Refill   DULoxetine (CYMBALTA) 20 MG capsule Take 1 capsule (20 mg total) by mouth daily. 30 capsule 3   ALPRAZolam (XANAX) 0.5 MG tablet Take by mouth.     ARIPiprazole (ABILIFY) 5 MG tablet Take 1 tablet (5 mg total) by mouth daily. 30 tablet 3   atorvastatin (LIPITOR) 20 MG tablet TAKE 1 TABLET (20 MG TOTAL) BY MOUTH DAILY. 30 tablet 0   busPIRone (BUSPAR) 15 MG tablet Take 1 tablet (15 mg total) by mouth 2 (two) times  daily. 30 tablet 3   cetirizine (ZYRTEC) 10 MG tablet Take 1 tablet (10 mg total) by mouth daily. 90 tablet 3   clonazePAM (KLONOPIN) 0.5 MG tablet Take by mouth.     fluticasone (FLONASE) 50 MCG/ACT nasal spray Place 2 sprays into both nostrils daily. 48 g 3   furosemide (LASIX) 20 MG tablet TAKE 1 TABLET (20 MG TOTAL) BY MOUTH DAILY AS NEEDED FOR EDEMA. 30 tablet 0   gabapentin (NEURONTIN) 300 MG capsule TAKE 1 CAPSULE BY MOUTH THREE TIMES A DAY 90 capsule 3   hydrochlorothiazide (HYDRODIURIL) 25 MG tablet Take 1 tablet by mouth daily.     lisinopril (ZESTRIL) 10 MG tablet TAKE 1 TABLET (10 MG TOTAL) BY MOUTH DAILY. 30 tablet 0   nicotine (NICODERM CQ - DOSED IN MG/24 HOURS) 14 mg/24hr patch Place 1 patch (14 mg total) onto the skin daily. 28 patch 3   sertraline (ZOLOFT) 100 MG tablet Take 1.5 tablets (150 mg total) by mouth daily. 45 tablet 3   traMADol (ULTRAM) 50 MG tablet TAKE 1 TABLET BY MOUTH DAILY 30 tablet 1   No current facility-administered medications for this visit.     Musculoskeletal: Strength & Muscle Tone: within normal limits and Telehealth visit Gait & Station: normal, Telehealth visit Patient leans: N/A  Psychiatric Specialty Exam: Review of Systems  There were no vitals taken for this visit.There is no height or weight on file to calculate BMI.  General Appearance: Well Groomed  Eye Contact:  Good  Speech:  Clear and Coherent and Normal Rate  Volume:  Normal  Mood:  Anxious and Depressed  Affect:  Appropriate, Congruent, and Tearful  Thought Process:  Coherent, Goal Directed, and Linear  Orientation:  Full (Time, Place, and Person)  Thought Content: WDL and Logical   Suicidal Thoughts:  Yes.  without intent/plan  Homicidal Thoughts:  No  Memory:  Immediate;   Good Recent;   Good Remote;   Good  Judgement:  Good  Insight:  Good  Psychomotor Activity:  Normal  Concentration:  Concentration: Good and Attention Span: Good  Recall:  Good  Fund of  Knowledge: Good  Language: Good  Akathisia:  No  Handed:  Right  AIMS (if indicated): not done  Assets:  Communication Skills Desire for Improvement Financial Resources/Insurance Housing Physical Health  ADL's:  Intact  Cognition: WNL  Sleep:  Poor   Screenings: AIMS    Flowsheet Row Video Visit from 04/20/2020 in [facility]  AIMS Total Score 0      AUDIT    Flowsheet Row [facility]  Alcohol Use Disorder Identification Test Final Score (AUDIT) 0      GAD-7    Flowsheet Row Video Visit from 12/14/2022 in [facility] Video Visit from 09/21/2022 in [facility] Video Visit from 07/06/2022 in [facility] Video Visit from 01/27/2022 in [facility] Office Visit from 01/25/2022 in [facility]  Total GAD-7 Score 17 16 8 10 12       PHQ2-9    Flowsheet Row Video Visit from 12/14/2022 in [facility] Video Visit from 09/21/2022 in [facility] Office Visit from 09/13/2022 in [facility] Office Visit from 07/22/2022 in [facility] Video Visit from 07/06/2022 in [facility]  PHQ-2 Total Score 3 4 0 2 2  PHQ-9 Total Score 18 16 -- -- 11      Flowsheet Row Video Visit from 12/14/2022 in [facility] Video Visit from 07/06/2022 in [facility] Video Visit from 01/27/2022 in [facility]  C-SSRS RISK CATEGORY Error: Q7 should not be populated when Q6 is No Error: Q7 should not be populated when Q6 is No Error: Q7 should not be populated when Q6 is No        Assessment and Plan: Patient endorses increased  anxiety, depression, and marijuana use. Provider suggested switching antipsychotic or increasing her Abilify.  Patient notes that she does not wish to gain weight and reports that  Abilify has caused her to gain 15 pounds.  Provider informed patient that Abilify was less likely than other antipsychotics to increase weight and to inform her that her mental  state is just as important as her weight.  She endorsed understanding and was agreeable to increasing Abilify 2 mg to 5 mg to help manage mood.  She was also agreeable adding a small dose of Cymbalta to help manage pain, anxiety, or depression does not improved. Provider discussed risk and benefits of being on two antidepressants.  She will continue all other medication as prescribed..  1. Moderate episode of recurrent major depressive disorder (HCC)  Increased- ARIPiprazole (ABILIFY) 5 MG tablet; Take 1 tablet (5 mg total) by mouth daily.  Dispense: 30 tablet; Refill: 3 Continue- busPIRone (BUSPAR) 15 MG tablet; Take 1 tablet (15 mg total) by mouth 2 (two) times daily.  Dispense: 30 tablet; Refill: 3 Continue- gabapentin (NEURONTIN) 300 MG capsule; TAKE 1 CAPSULE BY MOUTH THREE TIMES A DAY  Dispense: 90 capsule; Refill: 3 Continue- sertraline (ZOLOFT) 100 MG tablet; Take 1.5 tablets (150 mg total) by mouth daily.  Dispense: 45 tablet; Refill: 3 Start- DULoxetine (CYMBALTA) 20 MG capsule; Take 1 capsule (20 mg total) by mouth daily.  Dispense: 30 capsule; Refill: 3  2.  GAD (generalized anxiety disorder)  Continue- busPIRone (BUSPAR) 15 MG tablet; Take 1 tablet (15 mg total) by mouth 2 (two) times daily.  Dispense: 30 tablet; Refill: 3 Continue- gabapentin (NEURONTIN) 300 MG capsule; TAKE 1 CAPSULE BY MOUTH THREE TIMES A DAY  Dispense: 90 capsule; Refill: 3 Continue- sertraline (ZOLOFT) 100 MG tablet; Take 1.5 tablets (150 mg total) by mouth daily.  Dispense: 45 tablet; Refill: 3 Start- DULoxetine (CYMBALTA) 20 MG capsule; Take 1 capsule (20 mg  total) by mouth daily.  Dispense: 30 capsule; Refill: 3  3. Tobacco abuse  Continue- nicotine (NICODERM CQ - DOSED IN MG/24 HOURS) 14 mg/24hr patch; Place 1 patch (14 mg total) onto the skin daily.  Dispense: 28 patch; Refill: 3  4. Substance induced mood disorder (HCC)  Increased- ARIPiprazole (ABILIFY) 5 MG tablet; Take 1 tablet (5 mg total) by mouth daily.  Dispense: 30 tablet; Refill: 3 Continue- busPIRone (BUSPAR) 15 MG tablet; Take 1 tablet (15 mg total) by mouth 2 (two) times daily.  Dispense: 30 tablet; Refill: 3 Continue- gabapentin (NEURONTIN) 300 MG capsule; TAKE 1 CAPSULE BY MOUTH THREE TIMES A DAY  Dispense: 90 capsule; Refill: 3 Continue- sertraline (ZOLOFT) 100 MG tablet; Take 1.5 tablets (150 mg total) by mouth daily.  Dispense: 45 tablet; Refill: 3 Start- DULoxetine (CYMBALTA) 20 MG capsule; Take 1 capsule (20 mg total) by mouth daily.  Dispense: 30 capsule; Refill: 3  5. Marijuana use       Follow-up in 3 months Follow-up with therapy Shanna Cisco, NP 12/14/2022, 5:37 PM

## 2022-12-20 ENCOUNTER — Other Ambulatory Visit: Payer: Self-pay | Admitting: Family Medicine

## 2022-12-20 DIAGNOSIS — E7849 Other hyperlipidemia: Secondary | ICD-10-CM

## 2023-02-28 ENCOUNTER — Telehealth (HOSPITAL_COMMUNITY): Payer: Medicaid Other | Admitting: Psychiatry

## 2023-02-28 ENCOUNTER — Encounter (HOSPITAL_COMMUNITY): Payer: Self-pay | Admitting: Psychiatry

## 2023-02-28 DIAGNOSIS — F1994 Other psychoactive substance use, unspecified with psychoactive substance-induced mood disorder: Secondary | ICD-10-CM | POA: Diagnosis not present

## 2023-02-28 DIAGNOSIS — F411 Generalized anxiety disorder: Secondary | ICD-10-CM | POA: Diagnosis not present

## 2023-02-28 DIAGNOSIS — Z720 Tobacco use: Secondary | ICD-10-CM

## 2023-02-28 DIAGNOSIS — F1290 Cannabis use, unspecified, uncomplicated: Secondary | ICD-10-CM | POA: Diagnosis not present

## 2023-02-28 MED ORDER — BUSPIRONE HCL 15 MG PO TABS: 15.0000 mg | ORAL_TABLET | Freq: Two times a day (BID) | ORAL | 3 refills | Status: DC

## 2023-02-28 MED ORDER — NICOTINE 14 MG/24HR TD PT24: 14.0000 mg | MEDICATED_PATCH | Freq: Every day | TRANSDERMAL | 3 refills | Status: DC

## 2023-02-28 MED ORDER — ARIPIPRAZOLE 5 MG PO TABS: 5.0000 mg | ORAL_TABLET | Freq: Every day | ORAL | 3 refills | Status: DC

## 2023-02-28 MED ORDER — GABAPENTIN 300 MG PO CAPS: ORAL_CAPSULE | ORAL | 3 refills | Status: DC

## 2023-02-28 MED ORDER — DULOXETINE HCL 20 MG PO CPEP: 20.0000 mg | ORAL_CAPSULE | Freq: Every day | ORAL | 3 refills | Status: DC

## 2023-02-28 MED ORDER — SERTRALINE HCL 100 MG PO TABS: 150.0000 mg | ORAL_TABLET | Freq: Every day | ORAL | 3 refills | Status: DC

## 2023-02-28 NOTE — Progress Notes (Signed)
 [facility] MD/PA/NP OP Progress Note Virtual Visit via Video Note  I connected with Stacy Barrett on 02/28/23 at 11:00 AM EST by a video enabled telemedicine application and verified that I am speaking with the correct person using two identifiers.  Location: Patient: Home Provider: Clinic   I discussed the limitations of evaluation and management by telemedicine and the availability of in person appointments. The patient expressed understanding and agreed to proceed.  I provided 30 minutes of non-face-to-face time during this encounter.    02/28/2023 10:38 AM Stacy Barrett  MRN:  [id number]  Chief Complaint: I feel numb  HPI: 58 year old female seen today for follow-up psychiatric evaluation.  She has a psychiatric history of anxiety, marijuana dependence, depression, panic disorder, and tobacco dependence.  She is currently managed on Abilify  5 mg daily, Cymbalta  20 mg daily, BuSpar  15 mg twice daily, gabapentin  300 mg 3 times daily, and Zoloft  150 mg daily.  She informed clinical research associate that that her medications are effective in managing her psychiatric addition.   Today she logged in virtually but her camera was turned off. She informed clinical research associate that she feels numb. She reports that recently her mother became aggressive, called 911 several times, complained to her neighbors, and told people that she was trying to kill her. She reports that it was determined that she had a UTI. Patient reports that she is frightened that her mother will have to be placed in a nursing home. Patient reports that she quit her job over 11 years ago to care for her mother. Financially she notes that she has been relying on her mothers benefits. She now notes that she is uncertain what to do and wants answers about what the next step is.   Patient notes that the above worsen her anxiety and depression but reports that it is situational. She does note that the adjustment of Abilify  and Cymbalta  was effective and notes that she  is trusting God to fix situation she has no control over. Since her last visit she reports that she no longer feels hopeless and reports that her anxiety and depression somewhat improved.  Today provider conducted a GAD-7 and patient scored an 11, at her last visit she scored a 17.  Provider also conducted PHQ-9 and patient scored a 10, at her last visit she scored 18.  She endorses adequate sleep and appetite.  Today she denies SI/HI/VAH, mania, paranoia.  Patient notes that she has been having foot pain. She reports that she slipped on a dust pan and fell. She quantifies her pain as 4/10.  She is no longer prescribed marijuana as she continues to smoke marijuana to cope with life stressors. Provide inform patient that marijuana can exacerbate her mental health.  She also notes that the provider who prescribed her tramadol  notes that he will no longer feel tramadol  since she is on marijuana.  She endorsed understanding however notes that she believes she needs it.   No medication changes made today.  Patient agreeable to continue medication as prescribed.  No other concerns at this time.  Visit Diagnosis:    ICD-10-CM   1. Substance induced mood disorder (HCC)  F19.94 ARIPiprazole  (ABILIFY ) 5 MG tablet    busPIRone  (BUSPAR ) 15 MG tablet    sertraline  (ZOLOFT ) 100 MG tablet    gabapentin  (NEURONTIN ) 300 MG capsule    DULoxetine  (CYMBALTA ) 20 MG capsule    2. GAD (generalized anxiety disorder)  F41.1 busPIRone  (BUSPAR ) 15 MG tablet  sertraline  (ZOLOFT ) 100 MG tablet    gabapentin  (NEURONTIN ) 300 MG capsule    DULoxetine  (CYMBALTA ) 20 MG capsule    3. Tobacco abuse  Z72.0 nicotine  (NICODERM CQ  - DOSED IN MG/24 HOURS) 14 mg/24hr patch    4. Marijuana use  F12.90          Past Psychiatric History: anxiety, depression, panic disorder, marijuana dependence, and tobacco dependence  Past Medical History:  Past Medical History:  Diagnosis Date   Anxiety    Depression    Edema     Hypertension    Lumbar radiculopathy    Pituitary adenoma (HCC)    Vertebral fracture    History reviewed. No pertinent surgical history.  Family Psychiatric History: Na  Family History:  Family History  Problem Relation Age of Onset   CAD Mother     Social History:  Social History   Socioeconomic History   Marital status: Divorced    Spouse name: Not on file   Number of children: Not on file   Years of education: Not on file   Highest education level: Not on file  Occupational History   Not on file  Tobacco Use   Smoking status: Every Day    Current packs/day: 0.50    Types: Cigarettes   Smokeless tobacco: Never  Vaping Use   Vaping status: Former   Substances: THC, CBD  Substance and Sexual Activity   Alcohol use: Yes    Alcohol/week: 1.0 standard drink of alcohol    Types: 1 Glasses of wine per week    Comment: hardly ever   Drug use: Yes    Frequency: 2.0 times per week    Types: Marijuana   Sexual activity: Not on file  Other Topics Concern   Not on file  Social History Narrative   Not on file   Social Drivers of Health   Financial Resource Strain: Not on file  Food Insecurity: Not on file  Transportation Needs: Not on file  Physical Activity: Not on file  Stress: Not on file  Social Connections: Not on file    Allergies:  Allergies  Allergen Reactions   Other Other (See Comments)    MSG -Very intense headaches   Penicillin G Other (See Comments)    Welps   Penicillins Hives, Other (See Comments) and Rash    Welps  Welps  Welps   Prednisone Itching, Rash and Swelling    Swelling of lips and face  Swelling of lips and face  Swelling of lips and face   Sulfa Antibiotics Hives, Itching and Other (See Comments)    Welps   Sulfamethoxazole     Other Reaction(s): Other (See Comments)  Welps   Monosodium Glutamate Other (See Comments) and Swelling   Hydrocodone Swelling    Noted swelling of tongue, knees, fingers and toes.  Transient.   No throat swelling.  Noted swelling of tongue, knees, fingers and toes.  Transient.  No throat swelling.  Noted swelling of tongue, knees, fingers and toes.  Transient.  No throat swelling.    Metabolic Disorder Labs: Lab Results  Component Value Date   HGBA1C 5.7 (H) 10/08/2013   MPG 117 (H) 10/08/2013   Lab Results  Component Value Date   PROLACTIN 37.5 (H) 10/28/2015   PROLACTIN 36.8 07/18/2014   Lab Results  Component Value Date   CHOL 155 05/22/2017   TRIG 124 05/22/2017   HDL 47 05/22/2017   CHOLHDL 3.3 05/22/2017   VLDL 60 (  H) 10/28/2015   LDLCALC 83 05/22/2017   LDLCALC 86 06/14/2016   Lab Results  Component Value Date   TSH 1.270 12/16/2019   TSH 1.130 12/06/2017    Therapeutic Level Labs: Lab Results  Component Value Date   LITHIUM  <0.25 (L) 10/03/2013   No results found for: VALPROATE No results found for: CBMZ  Current Medications: Current Outpatient Medications  Medication Sig Dispense Refill   ARIPiprazole  (ABILIFY ) 5 MG tablet Take 1 tablet (5 mg total) by mouth daily. 30 tablet 3   atorvastatin  (LIPITOR) 20 MG tablet TAKE 1 TABLET (20 MG TOTAL) BY MOUTH DAILY. 30 tablet 0   busPIRone  (BUSPAR ) 15 MG tablet Take 1 tablet (15 mg total) by mouth 2 (two) times daily. 30 tablet 3   cetirizine  (ZYRTEC ) 10 MG tablet Take 1 tablet (10 mg total) by mouth daily. 90 tablet 3   DULoxetine  (CYMBALTA ) 20 MG capsule Take 1 capsule (20 mg total) by mouth daily. 30 capsule 3   fluticasone  (FLONASE ) 50 MCG/ACT nasal spray Place 2 sprays into both nostrils daily. 48 g 3   furosemide  (LASIX ) 20 MG tablet TAKE 1 TABLET (20 MG TOTAL) BY MOUTH DAILY AS NEEDED FOR EDEMA. 30 tablet 0   gabapentin  (NEURONTIN ) 300 MG capsule TAKE 1 CAPSULE BY MOUTH THREE TIMES A DAY 90 capsule 3   hydrochlorothiazide  (HYDRODIURIL ) 25 MG tablet Take 1 tablet by mouth daily.     lisinopril  (ZESTRIL ) 10 MG tablet TAKE 1 TABLET (10 MG TOTAL) BY MOUTH DAILY. 30 tablet 0   nicotine  (NICODERM  CQ - DOSED IN MG/24 HOURS) 14 mg/24hr patch Place 1 patch (14 mg total) onto the skin daily. 28 patch 3   sertraline  (ZOLOFT ) 100 MG tablet Take 1.5 tablets (150 mg total) by mouth daily. 45 tablet 3   No current facility-administered medications for this visit.     Musculoskeletal: Strength & Muscle Tone: within normal limits and Telehealth visit Gait & Station: normal, Telehealth visit Patient leans: N/A  Psychiatric Specialty Exam: Review of Systems  There were no vitals taken for this visit.There is no height or weight on file to calculate BMI.  General Appearance: Well Groomed  Eye Contact:  Good  Speech:  Clear and Coherent and Normal Rate  Volume:  Normal  Mood:  Euthymic  Affect:  Appropriate, Congruent, and Tearful  Thought Process:  Coherent, Goal Directed, and Linear  Orientation:  Full (Time, Place, and Person)  Thought Content: WDL and Logical   Suicidal Thoughts:  Yes.  without intent/plan  Homicidal Thoughts:  No  Memory:  Immediate;   Good Recent;   Good Remote;   Good  Judgement:  Good  Insight:  Good  Psychomotor Activity:  Normal  Concentration:  Concentration: Good and Attention Span: Good  Recall:  Good  Fund of Knowledge: Good  Language: Good  Akathisia:  No  Handed:  Right  AIMS (if indicated): not done  Assets:  Communication Skills Desire for Improvement Financial Resources/Insurance Housing Physical Health  ADL's:  Intact  Cognition: WNL  Sleep:  Good   Screenings: AIMS    Flowsheet Row Video Visit from 04/20/2020 in [facility]  AIMS Total Score 0      AUDIT    Flowsheet Row [facility]  Alcohol Use Disorder Identification Test Final Score (AUDIT) 0      GAD-7    Flowsheet Row Video Visit from 02/28/2023 in [facility] Video Visit from 12/14/2022 in [facility] Video  Visit from 09/21/2022 in [facility] Video Visit from 07/06/2022 in [facility] Video Visit from 01/27/2022 in [facility]  Total GAD-7 Score 11 17 16 8 10       PHQ2-9    Flowsheet Row Video Visit from 02/28/2023 in [facility] Video Visit from 12/14/2022 in [facility] Video Visit from 09/21/2022 in [facility] Office Visit from 09/13/2022 in [facility] Office Visit from 07/22/2022 in [facility]  PHQ-2 Total Score 3 3 4  0 2  PHQ-9 Total Score 10 18 16  -- --      Flowsheet Row Video Visit from 02/28/2023 in [facility] Video Visit from 12/14/2022 in [facility] Video Visit from 07/06/2022 in [facility]  C-SSRS RISK CATEGORY Error: Q7 should not be populated when Q6 is No Error: Q7 should not be populated when Q6 is No Error: Q7 should not be populated when Q6 is No        Assessment and Plan: Patient reports that the increase in Abilify  and Cymbalta  effectively helped her mood, anxiety, depression.  She does continue to suffer with situational stressors but reports that she is relying on God to help manage her stressors.  She also uses marijuana to cope with her stressors.  At this time no medication changes made.  Patient will continue her medications prescribed.   1. Substance induced mood disorder (HCC) (Primary)  Continue- ARIPiprazole  (ABILIFY ) 5 MG tablet; Take 1 tablet (5 mg total) by mouth daily.  Dispense: 30 tablet; Refill: 3 Continue- busPIRone  (BUSPAR ) 15 MG tablet; Take 1 tablet (15 mg total) by mouth 2 (two) times daily.  Dispense: 30 tablet; Refill: 3 Continue- sertraline  (ZOLOFT ) 100 MG tablet; Take 1.5  tablets (150 mg total) by mouth daily.  Dispense: 45 tablet; Refill: 3 Continue- gabapentin  (NEURONTIN ) 300 MG capsule; TAKE 1 CAPSULE BY MOUTH THREE TIMES A DAY  Dispense: 90 capsule; Refill: 3 Continue- DULoxetine  (CYMBALTA ) 20 MG capsule; Take 1 capsule (20 mg total) by mouth daily.  Dispense: 30 capsule; Refill: 3  2. GAD (generalized anxiety disorder)  Continue- busPIRone  (BUSPAR ) 15 MG tablet; Take 1 tablet (15 mg total) by mouth 2 (two) times daily.  Dispense: 30 tablet; Refill: 3 Continue- sertraline  (ZOLOFT ) 100 MG tablet; Take 1.5 tablets (150 mg total) by mouth daily.  Dispense: 45 tablet; Refill: 3 Continue- gabapentin  (NEURONTIN ) 300 MG capsule; TAKE 1 CAPSULE BY MOUTH THREE TIMES A DAY  Dispense: 90 capsule; Refill: 3 Continue- DULoxetine  (CYMBALTA ) 20 MG capsule; Take 1 capsule (20 mg total) by mouth daily.  Dispense: 30 capsule; Refill: 3  3. Tobacco abuse  Continue- nicotine  (NICODERM CQ  - DOSED IN MG/24 HOURS) 14 mg/24hr patch; Place 1 patch (14 mg total) onto the skin daily.  Dispense: 28 patch; Refill: 3  4. Marijuana use         Follow-up in 3 months Follow-up with therapy Zane FORBES Bach, NP 02/28/2023, 10:38 AM

## 2023-04-26 ENCOUNTER — Encounter (HOSPITAL_COMMUNITY): Payer: Self-pay | Admitting: Psychiatry

## 2023-04-26 ENCOUNTER — Telehealth (INDEPENDENT_AMBULATORY_CARE_PROVIDER_SITE_OTHER): Payer: No Payment, Other | Admitting: Psychiatry

## 2023-04-26 ENCOUNTER — Other Ambulatory Visit (HOSPITAL_COMMUNITY): Payer: Self-pay | Admitting: Psychiatry

## 2023-04-26 DIAGNOSIS — F17210 Nicotine dependence, cigarettes, uncomplicated: Secondary | ICD-10-CM

## 2023-04-26 DIAGNOSIS — F1994 Other psychoactive substance use, unspecified with psychoactive substance-induced mood disorder: Secondary | ICD-10-CM | POA: Diagnosis not present

## 2023-04-26 DIAGNOSIS — F411 Generalized anxiety disorder: Secondary | ICD-10-CM | POA: Diagnosis not present

## 2023-04-26 DIAGNOSIS — Z720 Tobacco use: Secondary | ICD-10-CM

## 2023-04-26 MED ORDER — ARIPIPRAZOLE 5 MG PO TABS: 5.0000 mg | ORAL_TABLET | Freq: Every day | ORAL | 3 refills | Status: DC

## 2023-04-26 MED ORDER — GABAPENTIN 300 MG PO CAPS: ORAL_CAPSULE | ORAL | 3 refills | Status: DC

## 2023-04-26 MED ORDER — NICOTINE 14 MG/24HR TD PT24: 14.0000 mg | MEDICATED_PATCH | Freq: Every day | TRANSDERMAL | 3 refills | Status: DC

## 2023-04-26 MED ORDER — DULOXETINE HCL 40 MG PO CPEP: 40.0000 mg | ORAL_CAPSULE | Freq: Every day | ORAL | 3 refills | Status: DC

## 2023-04-26 MED ORDER — SERTRALINE HCL 100 MG PO TABS: 150.0000 mg | ORAL_TABLET | Freq: Every day | ORAL | 3 refills | Status: DC

## 2023-04-26 MED ORDER — BUSPIRONE HCL 15 MG PO TABS: 15.0000 mg | ORAL_TABLET | Freq: Two times a day (BID) | ORAL | 3 refills | Status: DC

## 2023-04-26 NOTE — Progress Notes (Signed)
 [facility] MD/PA/NP OP Progress Note Virtual Visit via Video Note  I connected with Stacy Barrett on 04/26/23 at 10:30 AM EDT by a video enabled telemedicine application and verified that I am speaking with the correct person using two identifiers.  Location: Patient: Home Provider: Clinic   I discussed the limitations of evaluation and management by telemedicine and the availability of in person appointments. The patient expressed understanding and agreed to proceed.  I provided 30 minutes of non-face-to-face time during this encounter.    04/26/2023 11:01 AM Stacy Barrett  MRN:  [id number]  Chief Complaint: "My mother needs surgery"   HPI: 58 year old female seen today for follow-up psychiatric evaluation.  She has a psychiatric history of anxiety, marijuana dependence, depression, panic disorder, and tobacco dependence.  She is currently managed on Abilify 5 mg daily, Cymbalta 20 mg daily, BuSpar 15 mg twice daily, gabapentin 300 mg 3 times daily, and Zoloft 150 mg daily.  She informed Clinical research associate that that her medications are effective in managing her psychiatric conditions.   Today she informed Clinical research associate that she she is concerned about her mother who will need surgery. She notes that she will need a Carotid bypass for blockage of her arteries.  Patient notes that she continues to feels numb and lack motivation to do things that she once died. She reports that her lack of motivation is isolating and interfering with her activities of daily living. She reports that she is also fearful that her mother will pass away.    Financially she notes that she has been relying on her mothers benefits. She notes that she wants a job but is unable to leave her. Provider recommended being a caregiver for her mother or working as a PCP. She notes that she would look into it. Patient also notes that her best friend has been stressing her out. She notes that she has been calling her out on her negative behaviors. She  notes that she is trying to get her into therapy.  Patient notes that the above worsen her anxiety and depression. Today provider conducted a GAD-7 and patient scored an 8, at her last visit she scored a 11.  Provider also conducted PHQ-9 and patient scored a 8, at her last visit she scored 10.  She endorses adequate sleep and appetite.  Today she denies SI/HI/VAH, mania, paranoia.  Patient notes that she has been having back pain.  She quantifies her pain as 5/10.  She is no longer prescribed her pain injections as she continues to smoke marijuana to cope with life stressors. Provide inform patient that marijuana can exacerbate her mental health.  She endorsed understanding however notes that she believes she needs it.  Patient also smokes tobacco to cope.  She notes that she would like to start her Nicorette patches.  Today patient agreeable to increase Cymbalta 20 mg to 40 mg to help manage depression and pain.  She will restart Nicorette CQ 14 mg patches.  She will continue all other medications as prescribed. Provider informed patient of the risk and benefits of being on two antidepressant.  She endorse understanding and agreed.  Patient does not have current labs.  Provider ordered CBC, CMP, prolactin level, LFT, thyroid panel, lipid panel, and UDS.  No other concerns at this time.  Visit Diagnosis:    ICD-10-CM   1. Substance induced mood disorder (HCC)  F19.94 ARIPiprazole (ABILIFY) 5 MG tablet    busPIRone (BUSPAR) 15 MG tablet    DULoxetine  40 MG CPEP    gabapentin (NEURONTIN) 300 MG capsule    sertraline (ZOLOFT) 100 MG tablet    CBC with Differential/Platelet    Comprehensive Metabolic Panel (CMET)    Lipid Profile    HgB A1c    Urine Drug Panel 7    Hepatic function panel    Thyroid Panel With TSH    Prolactin    2. GAD (generalized anxiety disorder)  F41.1 busPIRone (BUSPAR) 15 MG tablet    DULoxetine 40 MG CPEP    gabapentin (NEURONTIN) 300 MG capsule    sertraline (ZOLOFT)  100 MG tablet    3. Tobacco abuse  Z72.0 nicotine (NICODERM CQ - DOSED IN MG/24 HOURS) 14 mg/24hr patch          Past Psychiatric History: anxiety, depression, panic disorder, marijuana dependence, and tobacco dependence  Past Medical History:  Past Medical History:  Diagnosis Date   Anxiety    Depression    Edema    Hypertension    Lumbar radiculopathy    Pituitary adenoma (HCC)    Vertebral fracture    History reviewed. No pertinent surgical history.  Family Psychiatric History: Na  Family History:  Family History  Problem Relation Age of Onset   CAD Mother     Social History:  Social History   Socioeconomic History   Marital status: Divorced    Spouse name: Not on file   Number of children: Not on file   Years of education: Not on file   Highest education level: Not on file  Occupational History   Not on file  Tobacco Use   Smoking status: Every Day    Current packs/day: 0.50    Types: Cigarettes   Smokeless tobacco: Never  Vaping Use   Vaping status: Former   Substances: THC, CBD  Substance and Sexual Activity   Alcohol use: Yes    Alcohol/week: 1.0 standard drink of alcohol    Types: 1 Glasses of wine per week    Comment: "hardly ever"   Drug use: Yes    Frequency: 2.0 times per week    Types: Marijuana   Sexual activity: Not on file  Other Topics Concern   Not on file  Social History Narrative   Not on file   Social Drivers of Health   Financial Resource Strain: Not on file  Food Insecurity: Not on file  Transportation Needs: Not on file  Physical Activity: Not on file  Stress: Not on file  Social Connections: Not on file    Allergies:  Allergies  Allergen Reactions   Other Other (See Comments)    MSG -Very intense headaches   Penicillin G Other (See Comments)    Welps   Penicillins Hives, Other (See Comments) and Rash    Welps  Welps  Welps   Prednisone Itching, Rash and Swelling    Swelling of lips and face  Swelling of  lips and face  Swelling of lips and face   Sulfa Antibiotics Hives, Itching and Other (See Comments)    Welps   Sulfamethoxazole     Other Reaction(s): Other (See Comments)  Welps   Monosodium Glutamate Other (See Comments) and Swelling   Hydrocodone Swelling    Noted swelling of tongue, knees, fingers and toes.  Transient.  No throat swelling.  Noted swelling of tongue, knees, fingers and toes.  Transient.  No throat swelling.  Noted swelling of tongue, knees, fingers and toes.  Transient.  No throat swelling.  Metabolic Disorder Labs: Lab Results  Component Value Date   HGBA1C 5.7 (H) 10/08/2013   MPG 117 (H) 10/08/2013   Lab Results  Component Value Date   PROLACTIN 37.5 (H) 10/28/2015   PROLACTIN 36.8 07/18/2014   Lab Results  Component Value Date   CHOL 155 05/22/2017   TRIG 124 05/22/2017   HDL 47 05/22/2017   CHOLHDL 3.3 05/22/2017   VLDL 60 (H) 10/28/2015   LDLCALC 83 05/22/2017   LDLCALC 86 06/14/2016   Lab Results  Component Value Date   TSH 1.270 12/16/2019   TSH 1.130 12/06/2017    Therapeutic Level Labs: Lab Results  Component Value Date   LITHIUM <0.25 (L) 10/03/2013   No results found for: "VALPROATE" No results found for: "CBMZ"  Current Medications: Current Outpatient Medications  Medication Sig Dispense Refill   ARIPiprazole (ABILIFY) 5 MG tablet Take 1 tablet (5 mg total) by mouth daily. 30 tablet 3   atorvastatin (LIPITOR) 20 MG tablet TAKE 1 TABLET (20 MG TOTAL) BY MOUTH DAILY. 30 tablet 0   busPIRone (BUSPAR) 15 MG tablet Take 1 tablet (15 mg total) by mouth 2 (two) times daily. 30 tablet 3   cetirizine (ZYRTEC) 10 MG tablet Take 1 tablet (10 mg total) by mouth daily. 90 tablet 3   DULoxetine 40 MG CPEP Take 1 capsule (40 mg total) by mouth daily. 30 capsule 3   fluticasone (FLONASE) 50 MCG/ACT nasal spray Place 2 sprays into both nostrils daily. 48 g 3   furosemide (LASIX) 20 MG tablet TAKE 1 TABLET (20 MG TOTAL) BY MOUTH DAILY AS  NEEDED FOR EDEMA. 30 tablet 0   gabapentin (NEURONTIN) 300 MG capsule TAKE 1 CAPSULE BY MOUTH THREE TIMES A DAY 90 capsule 3   hydrochlorothiazide (HYDRODIURIL) 25 MG tablet Take 1 tablet by mouth daily.     lisinopril (ZESTRIL) 10 MG tablet TAKE 1 TABLET (10 MG TOTAL) BY MOUTH DAILY. 30 tablet 0   nicotine (NICODERM CQ - DOSED IN MG/24 HOURS) 14 mg/24hr patch Place 1 patch (14 mg total) onto the skin daily. 28 patch 3   sertraline (ZOLOFT) 100 MG tablet Take 1.5 tablets (150 mg total) by mouth daily. 45 tablet 3   No current facility-administered medications for this visit.     Musculoskeletal: Strength & Muscle Tone: within normal limits and Telehealth visit Gait & Station: normal, Telehealth visit Patient leans: N/A  Psychiatric Specialty Exam: Review of Systems  There were no vitals taken for this visit.There is no height or weight on file to calculate BMI.  General Appearance: Well Groomed  Eye Contact:  Good  Speech:  Clear and Coherent and Normal Rate  Volume:  Normal  Mood:  Euthymic, mild anxiety and depression but improving  Affect:  Appropriate and Congruent  Thought Process:  Coherent, Goal Directed, and Linear  Orientation:  Full (Time, Place, and Person)  Thought Content: WDL and Logical   Suicidal Thoughts:  Yes.  without intent/plan  Homicidal Thoughts:  No  Memory:  Immediate;   Good Recent;   Good Remote;   Good  Judgement:  Good  Insight:  Good  Psychomotor Activity:  Normal  Concentration:  Concentration: Good and Attention Span: Good  Recall:  Good  Fund of Knowledge: Good  Language: Good  Akathisia:  No  Handed:  Right  AIMS (if indicated): not done  Assets:  Communication Skills Desire for Improvement Financial Resources/Insurance Housing Physical Health  ADL's:  Intact  Cognition: WNL  Sleep:  Good   Screenings: AIMS    Flowsheet Row Video Visit from 04/20/2020 in [facility]  AIMS Total Score 0      AUDIT     Flowsheet Row [facility]  Alcohol Use Disorder Identification Test Final Score (AUDIT) 0      GAD-7    Flowsheet Row Video Visit from 04/26/2023 in [facility] Video Visit from 02/28/2023 in [facility] Video Visit from 12/14/2022 in [facility] Video Visit from 09/21/2022 in [facility] Video Visit from 07/06/2022 in [facility]  Total GAD-7 Score 8 11 17 16 8       [id number]    Flowsheet Row Video Visit from 04/26/2023 in [facility] Video Visit from 02/28/2023 in [facility] Video Visit from 12/14/2022 in [facility] Video Visit from 09/21/2022 in [facility] Office Visit from 09/13/2022 in [facility]  PHQ-2 Total Score 2 3 3 4  0  PHQ-9 Total Score 8 10 18 16  --      Flowsheet Row Video Visit from 04/26/2023 in [facility] Video Visit from 02/28/2023 in [facility] Video Visit from 12/14/2022 in [facility]  C-SSRS RISK CATEGORY Error: Q7 should not be populated when Q6 is No Error: Q7 should not be populated when Q6 is No Error: Q7 should not be populated when Q6 is No        Assessment and Plan: Patient reports that she has mild anxiety, depression, tobacco dependence, marijuana dependence, and pain.Today patient agreeable to increase Cymbalta 20 mg to 40 mg to help manage depression and pain.  She will restart Nicorette CQ 14 mg patches.  She will continue all other medications as prescribed. Provider informed patient of the risk and benefits of being on two antidepressant.  She endorse understanding and agreed.  Patient does not have current  labs.  Provider ordered CBC, CMP, prolactin level, LFT, thyroid panel, lipid panel, and UDS.  1. Substance induced mood disorder (HCC)  Continue- ARIPiprazole (ABILIFY) 5 MG tablet; Take 1 tablet (5 mg total) by mouth daily.  Dispense: 30 tablet; Refill: 3 Continue- busPIRone (BUSPAR) 15 MG tablet; Take 1 tablet (15 mg total) by mouth 2 (two) times daily.  Dispense: 30 tablet; Refill: 3 Continue- DULoxetine 40 MG CPEP; Take 1 capsule (40 mg total) by mouth daily.  Dispense: 30 capsule; Refill: 3 Continue- gabapentin (NEURONTIN) 300 MG capsule; TAKE 1 CAPSULE BY MOUTH THREE TIMES A DAY  Dispense: 90 capsule; Refill: 3 Continue- sertraline (ZOLOFT) 100 MG tablet; Take 1.5 tablets (150 mg total) by mouth daily.  Dispense: 45 tablet; Refill: 3 - CBC with Differential/Platelet; Future - Comprehensive Metabolic Panel (CMET); Future - Lipid Profile; Future - HgB A1c; Future - Urine Drug Panel 7; Future - Hepatic function panel; Future - Thyroid Panel With TSH; Future - Prolactin; Future  2. GAD (generalized anxiety disorder)  Continue- busPIRone (BUSPAR) 15 MG tablet; Take 1 tablet (15 mg total) by mouth 2 (two) times daily.  Dispense: 30 tablet; Refill: 3 Increased- DULoxetine 40 MG CPEP; Take 1 capsule (40 mg total) by mouth daily.  Dispense: 30 capsule; Refill: 3 Continue- gabapentin (NEURONTIN) 300 MG capsule; TAKE 1 CAPSULE BY MOUTH THREE TIMES A DAY  Dispense:  90 capsule; Refill: 3 Continue- sertraline (ZOLOFT) 100 MG tablet; Take 1.5 tablets (150 mg total) by mouth daily.  Dispense: 45 tablet; Refill: 3  3. Tobacco abuse  Restart- nicotine (NICODERM CQ - DOSED IN MG/24 HOURS) 14 mg/24hr patch; Place 1 patch (14 mg total) onto the skin daily.  Dispense: 28 patch; Refill: 3        Follow-up in 2.5 months Follow-up with therapy Shanna Cisco, NP 04/26/2023, 11:01 AM

## 2023-05-30 ENCOUNTER — Telehealth (HOSPITAL_COMMUNITY): Payer: Self-pay | Admitting: *Deleted

## 2023-05-30 NOTE — Telephone Encounter (Signed)
 Fax received for approval of the appeal that was done on Duloxetine. Approved until 05/23/24. Called to notify pharmacy.

## 2023-07-05 ENCOUNTER — Telehealth (INDEPENDENT_AMBULATORY_CARE_PROVIDER_SITE_OTHER): Admitting: Psychiatry

## 2023-07-05 ENCOUNTER — Encounter (HOSPITAL_COMMUNITY): Payer: Self-pay | Admitting: Psychiatry

## 2023-07-05 DIAGNOSIS — F1994 Other psychoactive substance use, unspecified with psychoactive substance-induced mood disorder: Secondary | ICD-10-CM

## 2023-07-05 DIAGNOSIS — F411 Generalized anxiety disorder: Secondary | ICD-10-CM | POA: Diagnosis not present

## 2023-07-05 MED ORDER — BUSPIRONE HCL 15 MG PO TABS: 15.0000 mg | ORAL_TABLET | Freq: Two times a day (BID) | ORAL | 3 refills | Status: DC

## 2023-07-05 MED ORDER — ARIPIPRAZOLE 5 MG PO TABS
5.0000 mg | ORAL_TABLET | Freq: Every day | ORAL | 3 refills | Status: DC
Start: 2023-07-05 — End: 2023-09-29

## 2023-07-05 MED ORDER — SERTRALINE HCL 100 MG PO TABS: 150.0000 mg | ORAL_TABLET | Freq: Every day | ORAL | 3 refills | Status: DC

## 2023-07-05 MED ORDER — GABAPENTIN 300 MG PO CAPS
ORAL_CAPSULE | ORAL | 3 refills | Status: DC
Start: 2023-07-05 — End: 2023-09-29

## 2023-07-05 MED ORDER — DULOXETINE HCL 40 MG PO CPEP: 40.0000 mg | ORAL_CAPSULE | Freq: Every day | ORAL | 3 refills | Status: DC
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
## 2023-09-14 ENCOUNTER — Telehealth (HOSPITAL_COMMUNITY): Admitting: Psychiatry

## 2023-09-29 ENCOUNTER — Telehealth (INDEPENDENT_AMBULATORY_CARE_PROVIDER_SITE_OTHER): Admitting: Family

## 2023-09-29 ENCOUNTER — Encounter (HOSPITAL_COMMUNITY): Payer: Self-pay | Admitting: Family

## 2023-09-29 DIAGNOSIS — F411 Generalized anxiety disorder: Secondary | ICD-10-CM

## 2023-09-29 DIAGNOSIS — F1994 Other psychoactive substance use, unspecified with psychoactive substance-induced mood disorder: Secondary | ICD-10-CM

## 2023-09-29 DIAGNOSIS — F331 Major depressive disorder, recurrent, moderate: Secondary | ICD-10-CM

## 2023-09-29 MED ORDER — BUSPIRONE HCL 15 MG PO TABS: 15.0000 mg | ORAL_TABLET | Freq: Two times a day (BID) | ORAL | 3 refills | Status: DC

## 2023-09-29 MED ORDER — ARIPIPRAZOLE 5 MG PO TABS: 5.0000 mg | ORAL_TABLET | Freq: Every day | ORAL | 3 refills | Status: DC

## 2023-09-29 MED ORDER — DULOXETINE HCL 40 MG PO CPEP
40.0000 mg | ORAL_CAPSULE | Freq: Every day | ORAL | 3 refills | Status: DC
Start: 2023-09-29 — End: 2023-12-29

## 2023-09-29 MED ORDER — SERTRALINE HCL 100 MG PO TABS: 150.0000 mg | ORAL_TABLET | Freq: Every day | ORAL | 3 refills | Status: DC

## 2023-09-29 MED ORDER — GABAPENTIN 300 MG PO CAPS
ORAL_CAPSULE | ORAL | 3 refills | Status: DC
Start: 2023-09-29 — End: 2023-12-29

## 2023-09-29 NOTE — Progress Notes (Signed)
 Virtual Visit via Telephone Note  I connected with Stacy Barrett on 09/29/23 at 11:00 AM EDT by telephone and verified that I am speaking with the correct person using two identifiers.  Location: Patient: Home Provider: [facility] Office    I discussed the limitations, risks, security and privacy concerns of performing an evaluation and management service by telephone and the availability of in person appointments. I also discussed with the patient that there may be a patient responsible charge related to this service. The patient expressed understanding and agreed to proceed.  I discussed the assessment and treatment plan with the patient. The patient was provided an opportunity to ask questions and all were answered. The patient agreed with the plan and demonstrated an understanding of the instructions.   The patient was advised to call back or seek an in-person evaluation if the symptoms worsen or if the condition fails to improve as anticipated.  I provided 45 minutes of non-face-to-face time during this encounter.   Stacy GORMAN Ramp, FNP   [facility] MD/PA/NP OP Progress Note Virtual Visit via Video Note  I connected with Stacy Barrett on 09/29/23 at 11:00 AM EDT by a video enabled telemedicine application and verified that I am speaking with the correct person using two identifiers.  Location: Patient: Home Provider: Clinic   I discussed the limitations of evaluation and management by telemedicine and the availability of in person appointments. The patient expressed understanding and agreed to proceed.  I provided 30 minutes of non-face-to-face time during this encounter.    09/29/2023 11:06 AM Stacy Barrett  MRN:  [id number]   Chief Complaint: My mother had a Carotid bypass and a stroke   HPI: 58 year old female seen today for follow-up psychiatric evaluation.  She has a psychiatric history of anxiety, marijuana dependence, depression, panic disorder, and tobacco dependence.   She is currently managed on Abilify 5 mg daily, Cymbalta 40 mg daily, BuSpar 15 mg twice daily, gabapentin 300 mg 3 times daily, and Zoloft 150 mg daily.  She informed Clinical research associate that that her medications are effective in managing her psychiatric conditions.   Patient reports she is doing well overall and remains hopeful regarding caring for her mother while maintaining her own health. She is cutting back on unnecessary household expenses and is applying for government aid. A friend from her past is currently living with her and providing emotional support. She also copes by gardening. She endorses adequate sleep and appetite.  She denies depression, anxiety, SI, HI, AVH, mania, or paranoia. She continues to smoke marijuana and reports she plans to quit once her mother returns home. She requested that her Nicorette patches not be refilled at this time. No acute safety concerns noted.  On exam, patient is well-groomed, pleasant, cooperative, and engaged in conversation with good eye contact. Affect is appropriate, mood euthymic, and thought processes are linear. No signs of psychosis observed.  Visit Diagnosis:    ICD-10-CM   1. GAD (generalized anxiety disorder)  F41.1     2. Moderate episode of recurrent major depressive disorder (HCC)  F33.1       Past Psychiatric History: anxiety, depression, panic disorder, marijuana dependence, and tobacco dependence  Past Medical History:  Past Medical History:  Diagnosis Date   Anxiety    Depression    Edema    Hypertension    Lumbar radiculopathy    Pituitary adenoma (HCC)    Vertebral fracture    No past surgical history on file.  Family  Psychiatric History: Na  Family History:  Family History  Problem Relation Age of Onset   CAD Mother     Social History:  Social History   Socioeconomic History   Marital status: Divorced    Spouse name: Not on file   Number of children: Not on file   Years of education: Not on file   Highest  education level: Not on file  Occupational History   Not on file  Tobacco Use   Smoking status: Every Day    Current packs/day: 0.50    Types: Cigarettes   Smokeless tobacco: Never  Vaping Use   Vaping status: Former   Substances: THC, CBD  Substance and Sexual Activity   Alcohol use: Yes    Alcohol/week: 1.0 standard drink of alcohol    Types: 1 Glasses of wine per week    Comment: hardly ever   Drug use: Yes    Frequency: 2.0 times per week    Types: Marijuana   Sexual activity: Not on file  Other Topics Concern   Not on file  Social History Narrative   Not on file   Social Drivers of Health   Financial Resource Strain: Not on file  Food Insecurity: Not on file  Transportation Needs: Not on file  Physical Activity: Not on file  Stress: Not on file  Social Connections: Not on file    Allergies:  Allergies  Allergen Reactions   Other Other (See Comments)    MSG -Very intense headaches   Penicillin G Other (See Comments)    Welps   Penicillins Hives, Other (See Comments) and Rash    Welps  Welps  Welps   Prednisone Itching, Rash and Swelling    Swelling of lips and face  Swelling of lips and face  Swelling of lips and face   Sulfa Antibiotics Hives, Itching and Other (See Comments)    Welps   Sulfamethoxazole     Other Reaction(s): Other (See Comments)  Welps   Monosodium Glutamate Other (See Comments) and Swelling   Hydrocodone Swelling    Noted swelling of tongue, knees, fingers and toes.  Transient.  No throat swelling.  Noted swelling of tongue, knees, fingers and toes.  Transient.  No throat swelling.  Noted swelling of tongue, knees, fingers and toes.  Transient.  No throat swelling.    Metabolic Disorder Labs: Lab Results  Component Value Date   HGBA1C 5.7 (H) 10/08/2013   MPG 117 (H) 10/08/2013   Lab Results  Component Value Date   PROLACTIN 37.5 (H) 10/28/2015   PROLACTIN 36.8 07/18/2014   Lab Results  Component Value Date    CHOL 155 05/22/2017   TRIG 124 05/22/2017   HDL 47 05/22/2017   CHOLHDL 3.3 05/22/2017   VLDL 60 (H) 10/28/2015   LDLCALC 83 05/22/2017   LDLCALC 86 06/14/2016   Lab Results  Component Value Date   TSH 1.270 12/16/2019   TSH 1.130 12/06/2017    Therapeutic Level Labs: Lab Results  Component Value Date   LITHIUM <0.25 (L) 10/03/2013   No results found for: VALPROATE No results found for: CBMZ  Current Medications: Current Outpatient Medications  Medication Sig Dispense Refill   ARIPiprazole (ABILIFY) 5 MG tablet Take 1 tablet (5 mg total) by mouth daily. 30 tablet 3   atorvastatin (LIPITOR) 20 MG tablet TAKE 1 TABLET (20 MG TOTAL) BY MOUTH DAILY. 30 tablet 0   busPIRone (BUSPAR) 15 MG tablet Take 1 tablet (15 mg total)  by mouth 2 (two) times daily. 30 tablet 3   cetirizine (ZYRTEC) 10 MG tablet Take 1 tablet (10 mg total) by mouth daily. 90 tablet 3   DULoxetine HCl 40 MG CPEP Take 1 capsule (40 mg total) by mouth daily. 30 capsule 3   fluticasone (FLONASE) 50 MCG/ACT nasal spray Place 2 sprays into both nostrils daily. 48 g 3   furosemide (LASIX) 20 MG tablet TAKE 1 TABLET (20 MG TOTAL) BY MOUTH DAILY AS NEEDED FOR EDEMA. 30 tablet 0   gabapentin (NEURONTIN) 300 MG capsule TAKE 1 CAPSULE BY MOUTH THREE TIMES A DAY 90 capsule 3   hydrochlorothiazide (HYDRODIURIL) 25 MG tablet Take 1 tablet by mouth daily.     lisinopril (ZESTRIL) 10 MG tablet TAKE 1 TABLET (10 MG TOTAL) BY MOUTH DAILY. 30 tablet 0   nicotine (NICODERM CQ - DOSED IN MG/24 HOURS) 14 mg/24hr patch Place 1 patch (14 mg total) onto the skin daily. 28 patch 3   sertraline (ZOLOFT) 100 MG tablet Take 1.5 tablets (150 mg total) by mouth daily. 45 tablet 3   No current facility-administered medications for this visit.     Musculoskeletal: Strength & Muscle Tone: within normal limits and Telehealth visit Gait & Station: normal, Telehealth visit Patient leans: N/A  Psychiatric Specialty Exam: Review of Systems   There were no vitals taken for this visit.There is no height or weight on file to calculate BMI.  General Appearance: Well Groomed  Eye Contact:  Good  Speech:  Clear and Coherent and Normal Rate  Volume:  Normal  Mood:  Euthymic  Affect:  Appropriate and Congruent  Thought Process:  Coherent, Goal Directed, and Linear  Orientation:  Full (Time, Place, and Person)  Thought Content: WDL and Logical   Suicidal Thoughts:  No  Homicidal Thoughts:  No  Memory:  Immediate;   Good Recent;   Good Remote;   Good  Judgement:  Good  Insight:  Good  Psychomotor Activity:  Normal  Concentration:  Concentration: Good and Attention Span: Good  Recall:  Good  Fund of Knowledge: Good  Language: Good  Akathisia:  No  Handed:  Right  AIMS (if indicated): not done  Assets:  Communication Skills Desire for Improvement Financial Resources/Insurance Housing Physical Health  ADL's:  Intact  Cognition: WNL  Sleep:  Good   Screenings: AIMS    Flowsheet Row Video Visit from 04/20/2020 in [facility]  AIMS Total Score 0   AUDIT    Flowsheet Row [facility]  Alcohol Use Disorder Identification Test Final Score (AUDIT) 0   GAD-7    Flowsheet Row Video Visit from 07/05/2023 in [facility] Video Visit from 04/26/2023 in [facility] Video Visit from 02/28/2023 in [facility] Video Visit from 12/14/2022 in [facility] Video Visit from 09/21/2022 in [facility]  Total GAD-7 Score 8 8 11 17 16    [id number]    Flowsheet Row Video Visit from 07/05/2023 in [facility] Video Visit from 04/26/2023 in [facility] Video Visit from 02/28/2023 in [facility] Video Visit from 12/14/2022 in  [facility] Video Visit from 09/21/2022 in [facility]  PHQ-2 Total Score 2 2 3 3 4   PHQ-9 Total Score 8 8 10 18 16    Flowsheet  Row Video Visit from 04/26/2023 in [facility] Video Visit from 02/28/2023 in [facility] Video Visit from 12/14/2022 in [facility]  C-SSRS RISK CATEGORY Error: Q7 should not be populated when Q6 is No Error: Q7 should not be populated when Q6 is No Error: Q7 should not be populated when Q6 is No     Assessment and Plan: Patient reports that her sleep anxiety, and depression has improved since her last visit. No medication changes made today. Patient agreeable to continue medications as prescribed.  No medication changes made today. Patient agreeable to continue medications as prescribes.  1. Substance induced mood disorder (HCC)  Continue- gabapentin (NEURONTIN) 300 MG capsule; TAKE 1 CAPSULE BY MOUTH THREE TIMES A DAY  Dispense: 90 capsule; Refill: 3 Continue- busPIRone (BUSPAR) 15 MG tablet; Take 1 tablet (15 mg total) by mouth 2 (two) times daily.  Dispense: 30 tablet; Refill: 3 Continue- sertraline (ZOLOFT) 100 MG tablet; Take 1.5 tablets (150 mg total) by mouth daily.  Dispense: 45 tablet; Refill: 3 Continue- ARIPiprazole (ABILIFY) 5 MG tablet; Take 1 tablet (5 mg total) by mouth daily.  Dispense: 30 tablet; Refill: 3 Continue- DULoxetine HCl 40 MG CPEP; Take 1 capsule (40 mg total) by mouth daily.  Dispense: 30 capsule; Refill: 3  2. GAD (generalized anxiety disorder)  Continue- gabapentin (NEURONTIN) 300 MG capsule; TAKE 1 CAPSULE BY MOUTH THREE TIMES A DAY  Dispense: 90 capsule; Refill: 3 Continue- busPIRone (BUSPAR) 15 MG tablet; Take 1 tablet (15 mg total) by mouth 2 (two) times daily.  Dispense: 30 tablet; Refill: 3 Continue- sertraline (ZOLOFT) 100 MG tablet; Take 1.5 tablets (150 mg total) by mouth daily.   Dispense: 45 tablet; Refill: 3 Continue- DULoxetine HCl 40 MG CPEP; Take 1 capsule (40 mg total) by mouth daily.  Dispense: 30 capsule; Refill: 3    Follow-up in 2.5 months Follow-up with therapy Stacy GORMAN Ramp, FNP 09/29/2023, 11:06 AM

## 2023-10-05 ENCOUNTER — Telehealth (HOSPITAL_COMMUNITY): Admitting: Psychiatry

## 2023-12-29 ENCOUNTER — Encounter (HOSPITAL_COMMUNITY): Payer: Self-pay | Admitting: Psychiatry

## 2023-12-29 ENCOUNTER — Telehealth (HOSPITAL_COMMUNITY): Admitting: Psychiatry

## 2023-12-29 DIAGNOSIS — F32A Depression, unspecified: Secondary | ICD-10-CM

## 2023-12-29 DIAGNOSIS — Z720 Tobacco use: Secondary | ICD-10-CM

## 2023-12-29 DIAGNOSIS — F411 Generalized anxiety disorder: Secondary | ICD-10-CM | POA: Diagnosis not present

## 2023-12-29 MED ORDER — DULOXETINE HCL 40 MG PO CPEP: 40.0000 mg | ORAL_CAPSULE | Freq: Every day | ORAL | 3 refills | Status: DC

## 2023-12-29 MED ORDER — NICOTINE 14 MG/24HR TD PT24: 14.0000 mg | MEDICATED_PATCH | Freq: Every day | TRANSDERMAL | 3 refills | Status: DC

## 2023-12-29 MED ORDER — SERTRALINE HCL 100 MG PO TABS: 200.0000 mg | ORAL_TABLET | Freq: Every day | ORAL | 3 refills | Status: DC

## 2023-12-29 MED ORDER — ARIPIPRAZOLE 10 MG PO TABS: 10.0000 mg | ORAL_TABLET | Freq: Every day | ORAL | 3 refills | Status: DC

## 2023-12-29 MED ORDER — GABAPENTIN 300 MG PO CAPS: ORAL_CAPSULE | ORAL | 3 refills | Status: DC

## 2023-12-29 MED ORDER — BUSPIRONE HCL 15 MG PO TABS: 15.0000 mg | ORAL_TABLET | Freq: Two times a day (BID) | ORAL | 3 refills | Status: DC

## 2023-12-29 NOTE — Progress Notes (Signed)
 [facility] MD/PA/NP OP Progress Note Virtual Visit via Video Note  I connected with Stacy Barrett on 12/29/23 at  9:30 AM EST by a video enabled telemedicine application and verified that I am speaking with the correct person using two identifiers.  Location: Patient: Home Provider: Clinic   I discussed the limitations of evaluation and management by telemedicine and the availability of in person appointments. The patient expressed understanding and agreed to proceed.  I provided 30 minutes of non-face-to-face time during this encounter.    12/29/2023 11:18 AM Stacy Barrett  MRN:  [id number]   Chief Complaint: Things are bad   HPI: 58 year old female seen today for follow-up psychiatric evaluation.  She has a psychiatric history of anxiety, marijuana dependence, depression, panic disorder, and tobacco dependence.  She is currently managed on Abilify  5 mg daily, Cymbalta  40 mg daily, BuSpar  15 mg twice daily, gabapentin  300 mg 3 times daily, and Zoloft  150 mg daily.  She informed clinical research associate that that her medications are somewhat effective in managing her psychiatric conditions.   Today she informed clinical research associate that things are bad. She notes that since her mothers stoke she has been in a state of delirium. She notes that her mother ran out the house and told neighbors she was being tied up. Her mother was then hospitalized and now in a rehab. She notes that she is concerned about her mothers confusion as she is calling her by deceased family members names. Ms. Stacy Barrett notes that this is weighing on mental health. She notes that she has been more depressed. She notes that she has been more tearful and fearful. She notes that she is unsure if she will be able to care for her mother. If she can't care for her she notes that she will lose her home. Financially she notes that she has been stressed. She reports that [employer] charged her account over 500$. She reports that she had to sell some of her mothers  Jewelery to get funds to survive. She notes that is unable to do projects that she once enjoyed. 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    Patient notes that the above worsens her anxiety and depression. Today provider conducted a GAD-7 and patient scored an 13. Provider also conducted PHQ-9 and patient scored a 15.  She endorses poor sleep (4-5 hours) and appetite.  Today she denies SI/HI/VAH, mania, paranoia.  Patient reports that her friend and her friend daughter has helped her get through the above stressors. She notes that her friend was able to fix her air conditioner and car. She also copes with smoking one pack of cigarettes a day. She reports that when she tries to stop smoking she becomes suicidal. She denies wanting to harm herself today.  Patient notes that she no longer smokes marijuana as she can not afford it. She also denies alcohol use.  Today Abilify  increased from 5 mg to 10 mg to help manage depression.  Zoloft  150 mg increased to 200 mg to help with anxiety and depression.  She will continue her other medications as prescribed.  No other concerns at this time.  Visit Diagnosis:    ICD-10-CM   1. Mild depression  F32.A ARIPiprazole  (ABILIFY ) 10 MG tablet    sertraline  (ZOLOFT ) 100 MG tablet    busPIRone  (BUSPAR ) 15 MG tablet    DULoxetine  HCl 40 MG CPEP    2. GAD (generalized anxiety disorder)  F41.1 sertraline  (ZOLOFT ) 100 MG tablet    gabapentin  (NEURONTIN ) 300 MG capsule    busPIRone  (BUSPAR ) 15 MG tablet    DULoxetine  HCl 40 MG CPEP    3. Tobacco abuse  Z72.0 nicotine  (NICODERM CQ  - DOSED IN MG/24 HOURS) 14 mg/24hr patch           Past Psychiatric History: anxiety, depression, panic disorder, marijuana dependence, and tobacco dependence  Past Medical History:  Past Medical History:  Diagnosis Date   Anxiety    Depression    Edema    Hypertension    Lumbar radiculopathy    Pituitary adenoma (HCC)     Vertebral fracture    History reviewed. No pertinent surgical history.  Family Psychiatric History: Na  Family History:  Family History  Problem Relation Age of Onset   CAD Mother     Social History:  Social History   Socioeconomic History   Marital status: Divorced    Spouse name: Not on file   Number of children: Not on file   Years of education: Not on file   Highest education level: Not on file  Occupational History   Not on file  Tobacco Use   Smoking status: Every Day    Current packs/day: 0.50    Types: Cigarettes   Smokeless tobacco: Never  Vaping Use   Vaping status: Former   Substances: THC, CBD  Substance and Sexual Activity   Alcohol use: Yes    Alcohol/week: 1.0 standard drink of alcohol    Types: 1 Glasses of wine per week    Comment: hardly ever   Drug use: Yes    Frequency: 2.0 times per week    Types: Marijuana   Sexual activity: Not on file  Other Topics Concern   Not on file  Social History Narrative   Not on file   Social Drivers of Health   Financial Resource Strain: Not on file  Food Insecurity: Not on file  Transportation Needs: Not on file  Physical Activity: Not on file  Stress: Not on file  Social Connections: Not on file    Allergies:  Allergies  Allergen Reactions   Other Other (See Comments)    MSG -Very intense headaches   Penicillin G Other (See Comments)    Welps   Penicillins Hives, Other (See Comments) and Rash    Welps  Welps  Welps   Prednisone Itching, Rash and Swelling    Swelling of lips and face  Swelling of lips and face  Swelling of lips and face   Sulfa Antibiotics Hives, Itching and Other (See Comments)    Welps   Sulfamethoxazole     Other Reaction(s): Other (See Comments)  Welps   Monosodium Glutamate Other (See Comments) and Swelling   Hydrocodone Swelling    Noted swelling of tongue, knees, fingers and toes.  Transient.  No throat swelling.  Noted swelling of tongue, knees, fingers and  toes.  Transient.  No throat swelling.  Noted swelling of tongue, knees, fingers and toes.  Transient.  No throat swelling.    Metabolic Disorder Labs: Lab Results  Component Value Date   HGBA1C 5.7 (H) 10/08/2013   MPG 117 (H) 10/08/2013   Lab Results  Component Value Date   PROLACTIN 37.5 (H) 10/28/2015   PROLACTIN 36.8 07/18/2014   Lab Results  Component Value Date   CHOL 155 05/22/2017   TRIG 124 05/22/2017   HDL 47 05/22/2017   CHOLHDL 3.3 05/22/2017  VLDL 60 (H) 10/28/2015   LDLCALC 83 05/22/2017   LDLCALC 86 06/14/2016   Lab Results  Component Value Date   TSH 1.270 12/16/2019   TSH 1.130 12/06/2017    Therapeutic Level Labs: Lab Results  Component Value Date   LITHIUM  <0.25 (L) 10/03/2013   No results found for: VALPROATE No results found for: CBMZ  Current Medications: Current Outpatient Medications  Medication Sig Dispense Refill   ARIPiprazole  (ABILIFY ) 10 MG tablet Take 1 tablet (10 mg total) by mouth daily. 30 tablet 3   atorvastatin  (LIPITOR) 20 MG tablet TAKE 1 TABLET (20 MG TOTAL) BY MOUTH DAILY. 30 tablet 0   busPIRone  (BUSPAR ) 15 MG tablet Take 1 tablet (15 mg total) by mouth 2 (two) times daily. 30 tablet 3   cetirizine  (ZYRTEC ) 10 MG tablet Take 1 tablet (10 mg total) by mouth daily. 90 tablet 3   DULoxetine  HCl 40 MG CPEP Take 1 capsule (40 mg total) by mouth daily. 30 capsule 3   fluticasone  (FLONASE ) 50 MCG/ACT nasal spray Place 2 sprays into both nostrils daily. 48 g 3   furosemide  (LASIX ) 20 MG tablet TAKE 1 TABLET (20 MG TOTAL) BY MOUTH DAILY AS NEEDED FOR EDEMA. 30 tablet 0   gabapentin  (NEURONTIN ) 300 MG capsule TAKE 1 CAPSULE BY MOUTH THREE TIMES A DAY 90 capsule 3   hydrochlorothiazide  (HYDRODIURIL ) 25 MG tablet Take 1 tablet by mouth daily.     lisinopril  (ZESTRIL ) 10 MG tablet TAKE 1 TABLET (10 MG TOTAL) BY MOUTH DAILY. 30 tablet 0   nicotine  (NICODERM CQ  - DOSED IN MG/24 HOURS) 14 mg/24hr patch Place 1 patch (14 mg total) onto  the skin daily. 28 patch 3   sertraline  (ZOLOFT ) 100 MG tablet Take 2 tablets (200 mg total) by mouth daily. 60 tablet 3   No current facility-administered medications for this visit.     Musculoskeletal: Strength & Muscle Tone: within normal limits and Telehealth visit Gait & Station: normal, Telehealth visit Patient leans: N/A  Psychiatric Specialty Exam: Review of Systems  There were no vitals taken for this visit.There is no height or weight on file to calculate BMI.  General Appearance: Well Groomed  Eye Contact:  Good  Speech:  Clear and Coherent and Normal Rate  Volume:  Normal  Mood:  Anxious and Depressed  Affect:  Appropriate and Congruent  Thought Process:  Coherent, Goal Directed, and Linear  Orientation:  Full (Time, Place, and Person)  Thought Content: WDL and Logical   Suicidal Thoughts:  Yes.  without intent/plan  Homicidal Thoughts:  No  Memory:  Immediate;   Good Recent;   Good Remote;   Good  Judgement:  Good  Insight:  Good  Psychomotor Activity:  Normal  Concentration:  Concentration: Good and Attention Span: Good  Recall:  Good  Fund of Knowledge: Good  Language: Good  Akathisia:  No  Handed:  Right  AIMS (if indicated): not done  Assets:  Communication Skills Desire for Improvement Financial Resources/Insurance Housing Physical Health  ADL's:  Intact  Cognition: WNL  Sleep:  Fair   Screenings: AIMS    Flowsheet Row Video Visit from 04/20/2020 in [facility]  AIMS Total Score 0   AUDIT    Flowsheet Row [facility]  Alcohol Use Disorder Identification Test Final Score (AUDIT) 0   GAD-7    Flowsheet Row Video Visit from 12/29/2023 in [facility]  Video Visit from 07/05/2023 in [facility] Video Visit from 04/26/2023 in [facility] Video Visit from  02/28/2023 in [facility] Video Visit from 12/14/2022 in [facility]  Total GAD-7 Score 13 8 8 11 17    PHQ2-9    Flowsheet Row Video Visit from 12/29/2023 in [facility] Video Visit from 07/05/2023 in [facility] Video Visit from 04/26/2023 in [facility] Video Visit from 02/28/2023 in [facility] Video Visit from 12/14/2022 in [facility]  PHQ-2 Total Score 4 2 2 3 3   PHQ-9 Total Score 15 8 8 10 18    Flowsheet Row Video Visit from 12/29/2023 in [facility] Video Visit from 04/26/2023 in [facility] Video Visit from 02/28/2023 in [facility]  C-SSRS RISK CATEGORY Error: Q7 should not be populated when Q6 is No Error: Q7 should not be populated when Q6 is No Error: Q7 should not be populated when Q6 is No     Assessment and Plan: Patient reports that her sleep anxiety, and depression has worsened since her last visit. Today Abilify  increased from 5 mg to 10 mg to help manage depression.  Zoloft  150 mg increased to 200 mg to help with anxiety and depression.  She will continue her other medications as prescribed.   1. GAD (generalized anxiety disorder)  Continue- sertraline  (ZOLOFT ) 100 MG tablet; Take 2 tablets (200 mg total) by mouth daily.  Dispense: 60 tablet; Refill: 3 Continue- gabapentin  (NEURONTIN ) 300 MG capsule; TAKE 1 CAPSULE BY MOUTH THREE TIMES A DAY  Dispense: 90 capsule; Refill: 3 Continue- busPIRone  (BUSPAR ) 15 MG tablet; Take 1 tablet (15 mg total) by mouth 2 (two) times daily.  Dispense: 30 tablet; Refill: 3 Continue- DULoxetine  HCl 40 MG CPEP; Take 1 capsule (40 mg total) by mouth daily.  Dispense: 30 capsule; Refill: 3  2. Tobacco abuse  Continue- nicotine  (NICODERM CQ  - DOSED IN MG/24 HOURS)  14 mg/24hr patch; Place 1 patch (14 mg total) onto the skin daily.  Dispense: 28 patch; Refill: 3  3. Mild depression (Primary)  Increased- ARIPiprazole  (ABILIFY ) 10 MG tablet; Take 1 tablet (10 mg total) by mouth daily.  Dispense: 30 tablet; Refill: 3 Increased- sertraline  (ZOLOFT ) 100 MG tablet; Take 2 tablets (200 mg total) by mouth daily.  Dispense: 60 tablet; Refill: 3 Continue- busPIRone  (BUSPAR ) 15 MG tablet; Take 1 tablet (15 mg total) by mouth 2 (two) times daily.  Dispense: 30 tablet; Refill: 3 Continue- DULoxetine  HCl 40 MG CPEP; Take 1 capsule (40 mg total) by mouth daily.  Dispense: 30 capsule; Refill: 3  Follow-up in 2.5 months Follow-up with therapy Zane FORBES Bach, NP 12/29/2023, 11:18 AM

## 2024-03-13 ENCOUNTER — Encounter (HOSPITAL_COMMUNITY): Payer: Self-pay | Admitting: Psychiatry

## 2024-03-13 ENCOUNTER — Telehealth (INDEPENDENT_AMBULATORY_CARE_PROVIDER_SITE_OTHER): Admitting: Psychiatry

## 2024-03-13 DIAGNOSIS — F411 Generalized anxiety disorder: Secondary | ICD-10-CM

## 2024-03-13 DIAGNOSIS — F32A Depression, unspecified: Secondary | ICD-10-CM

## 2024-03-13 DIAGNOSIS — Z720 Tobacco use: Secondary | ICD-10-CM

## 2024-03-13 MED ORDER — DULOXETINE HCL 40 MG PO CPEP: 40.0000 mg | ORAL_CAPSULE | Freq: Every day | ORAL | 3 refills | Status: AC

## 2024-03-13 MED ORDER — GABAPENTIN 300 MG PO CAPS: ORAL_CAPSULE | ORAL | 3 refills | Status: AC

## 2024-03-13 MED ORDER — SERTRALINE HCL 100 MG PO TABS: 200.0000 mg | ORAL_TABLET | Freq: Every day | ORAL | 3 refills | Status: AC

## 2024-03-13 MED ORDER — BUSPIRONE HCL 15 MG PO TABS: 15.0000 mg | ORAL_TABLET | Freq: Two times a day (BID) | ORAL | 3 refills | Status: AC

## 2024-03-13 MED ORDER — ARIPIPRAZOLE 10 MG PO TABS: 10.0000 mg | ORAL_TABLET | Freq: Every day | ORAL | 3 refills | Status: AC

## 2024-03-13 MED ORDER — NICOTINE 14 MG/24HR TD PT24: 14.0000 mg | MEDICATED_PATCH | Freq: Every day | TRANSDERMAL | 3 refills | Status: AC

## 2024-03-13 NOTE — Progress Notes (Signed)
 BH MD/PA/NP OP Progress Note Virtual Visit via Video Note  I connected with Stacy Barrett on 03/13/24 at 11:00 AM EST by a video enabled telemedicine application and verified that I am speaking with the correct person using two identifiers.  Location: Patient: Home Provider: Clinic   I discussed the limitations of evaluation and management by telemedicine and the availability of in person appointments. The patient expressed understanding and agreed to proceed.  I provided 30 minutes of non-face-to-face time during this encounter.    03/13/2024 11:25 AM Stacy Barrett  MRN:  [id number]   Chief Complaint: I am overly adjutated, frustrated, and scared   HPI: 59 year old female seen today for follow-up psychiatric evaluation.  She has a psychiatric history of anxiety, marijuana dependence, depression, panic disorder, and tobacco dependence.  She is currently managed on Abilify  10 mg daily, Cymbalta  40 mg daily, BuSpar  15 mg twice daily, gabapentin  300 mg 3 times daily, and Zoloft  200 mg daily.  She informed clinical research associate that that her medications are effective in managing her psychiatric conditions.   Today she informed clinical research associate that things are bad. She notes that she is overly agitated, frustrated, and scared. She notes that her mother as officially been diagnosed with Alzheimer's disease. She reports that she is stressed as financially stressed. She notes that she and her family continue to be in debt 600$ monthly. She notes that she  feels like a balloon that will pop at times.  To cope patient notes that she smokes cigarettes and marijuana (nightly).  She also informed writer that the increase in Abilify  and Zoloft  help manage the above stress.    Since increasing her medication patient notes that her anxiety depression has been better managed.  She also find support in one of her friends who lives with her and her daughter.  She notes that recently she celebrated her birthday with her family and  found this enjoyable.    Today provider conducted a GAD-7 and patient scored an 7, at her last visit she scored a 13. Provider also conducted PHQ-9 and patient scored an 11 at her last visit she scored a 15.  She endorses poor sleep (4-5 hours) and appetite.  Today she denies SI/HI/VAH, mania, paranoia.  Patient informed clinical research associate that she is ready for change.  She notes that she would like to return to work.  She inform her that she has a meeting with a child psychotherapist to discuss finances and potential placement in an assisted living facility for her mother.  Today patient referred to outpatient counseling for therapy.  No medication changes made today.  Patient agreeable to take medication as prescribed. No other concerns at this time.  Visit Diagnosis:    ICD-10-CM   1. GAD (generalized anxiety disorder)  F41.1 gabapentin  (NEURONTIN ) 300 MG capsule    DULoxetine  HCl 40 MG CPEP    sertraline  (ZOLOFT ) 100 MG tablet    busPIRone  (BUSPAR ) 15 MG tablet    Ambulatory referral to Social Work    2. Mild depression  F32.A ARIPiprazole  (ABILIFY ) 10 MG tablet    DULoxetine  HCl 40 MG CPEP    sertraline  (ZOLOFT ) 100 MG tablet    busPIRone  (BUSPAR ) 15 MG tablet    Ambulatory referral to Social Work    3. Tobacco abuse  Z72.0 nicotine  (NICODERM CQ  - DOSED IN MG/24 HOURS) 14 mg/24hr patch            Past Psychiatric History: anxiety, depression, panic disorder, marijuana dependence, and  tobacco dependence  Past Medical History:  Past Medical History:  Diagnosis Date   Anxiety    Depression    Edema    Hypertension    Lumbar radiculopathy    Pituitary adenoma (HCC)    Vertebral fracture    No past surgical history on file.  Family Psychiatric History: Na  Family History:  Family History  Problem Relation Age of Onset   CAD Mother     Social History:  Social History   Socioeconomic History   Marital status: Divorced    Spouse name: Not on file   Number of children: Not on file    Years of education: Not on file   Highest education level: Not on file  Occupational History   Not on file  Tobacco Use   Smoking status: Every Day    Current packs/day: 0.50    Types: Cigarettes   Smokeless tobacco: Never  Vaping Use   Vaping status: Former   Substances: THC, CBD  Substance and Sexual Activity   Alcohol use: Yes    Alcohol/week: 1.0 standard drink of alcohol    Types: 1 Glasses of wine per week    Comment: hardly ever   Drug use: Yes    Frequency: 2.0 times per week    Types: Marijuana   Sexual activity: Not on file  Other Topics Concern   Not on file  Social History Narrative   Not on file   Social Drivers of Health   Tobacco Use: High Risk (12/29/2023)   Patient History    Smoking Tobacco Use: Every Day    Smokeless Tobacco Use: Never    Passive Exposure: Not on file  Financial Resource Strain: Not on file  Food Insecurity: Not on file  Transportation Needs: Not on file  Physical Activity: Not on file  Stress: Not on file  Social Connections: Not on file  Depression (PHQ2-9): High Risk (03/13/2024)   Depression (PHQ2-9)    PHQ-2 Score: 11  Alcohol Screen: Not on file  Housing: Not on file  Utilities: Not on file  Health Literacy: Not on file    Allergies:  Allergies  Allergen Reactions   Other Other (See Comments)    MSG -Very intense headaches   Penicillin G Other (See Comments)    Welps   Penicillins Hives, Other (See Comments) and Rash    Welps  Welps  Welps   Prednisone Itching, Rash and Swelling    Swelling of lips and face  Swelling of lips and face  Swelling of lips and face   Sulfa Antibiotics Hives, Itching and Other (See Comments)    Welps   Sulfamethoxazole     Other Reaction(s): Other (See Comments)  Welps   Monosodium Glutamate Other (See Comments) and Swelling   Hydrocodone Swelling    Noted swelling of tongue, knees, fingers and toes.  Transient.  No throat swelling.  Noted swelling of tongue, knees,  fingers and toes.  Transient.  No throat swelling.  Noted swelling of tongue, knees, fingers and toes.  Transient.  No throat swelling.    Metabolic Disorder Labs: Lab Results  Component Value Date   HGBA1C 5.7 (H) 10/08/2013   MPG 117 (H) 10/08/2013   Lab Results  Component Value Date   PROLACTIN 37.5 (H) 10/28/2015   PROLACTIN 36.8 07/18/2014   Lab Results  Component Value Date   CHOL 155 05/22/2017   TRIG 124 05/22/2017   HDL 47 05/22/2017   CHOLHDL 3.3 05/22/2017  VLDL 60 (H) 10/28/2015   LDLCALC 83 05/22/2017   LDLCALC 86 06/14/2016   Lab Results  Component Value Date   TSH 1.270 12/16/2019   TSH 1.130 12/06/2017    Therapeutic Level Labs: Lab Results  Component Value Date   LITHIUM  <0.25 (L) 10/03/2013   No results found for: VALPROATE No results found for: CBMZ  Current Medications: Current Outpatient Medications  Medication Sig Dispense Refill   ARIPiprazole  (ABILIFY ) 10 MG tablet Take 1 tablet (10 mg total) by mouth daily. 30 tablet 3   atorvastatin  (LIPITOR) 20 MG tablet TAKE 1 TABLET (20 MG TOTAL) BY MOUTH DAILY. 30 tablet 0   busPIRone  (BUSPAR ) 15 MG tablet Take 1 tablet (15 mg total) by mouth 2 (two) times daily. 30 tablet 3   cetirizine  (ZYRTEC ) 10 MG tablet Take 1 tablet (10 mg total) by mouth daily. 90 tablet 3   DULoxetine  HCl 40 MG CPEP Take 1 capsule (40 mg total) by mouth daily. 30 capsule 3   fluticasone  (FLONASE ) 50 MCG/ACT nasal spray Place 2 sprays into both nostrils daily. 48 g 3   furosemide  (LASIX ) 20 MG tablet TAKE 1 TABLET (20 MG TOTAL) BY MOUTH DAILY AS NEEDED FOR EDEMA. 30 tablet 0   gabapentin  (NEURONTIN ) 300 MG capsule TAKE 1 CAPSULE BY MOUTH THREE TIMES A DAY 90 capsule 3   hydrochlorothiazide  (HYDRODIURIL ) 25 MG tablet Take 1 tablet by mouth daily.     lisinopril  (ZESTRIL ) 10 MG tablet TAKE 1 TABLET (10 MG TOTAL) BY MOUTH DAILY. 30 tablet 0   nicotine  (NICODERM CQ  - DOSED IN MG/24 HOURS) 14 mg/24hr patch Place 1 patch (14 mg  total) onto the skin daily. 28 patch 3   sertraline  (ZOLOFT ) 100 MG tablet Take 2 tablets (200 mg total) by mouth daily. 60 tablet 3   No current facility-administered medications for this visit.     Musculoskeletal: Strength & Muscle Tone: within normal limits and Telehealth visit Gait & Station: normal, Telehealth visit Patient leans: N/A  Psychiatric Specialty Exam: Review of Systems  There were no vitals taken for this visit.There is no height or weight on file to calculate BMI.  General Appearance: Well Groomed  Eye Contact:  Good  Speech:  Clear and Coherent and Normal Rate  Volume:  Normal  Mood:  Anxious and Depressed  Affect:  Appropriate and Congruent  Thought Process:  Coherent, Goal Directed, and Linear  Orientation:  Full (Time, Place, and Person)  Thought Content: WDL and Logical   Suicidal Thoughts:  Yes.  without intent/plan  Homicidal Thoughts:  No  Memory:  Immediate;   Good Recent;   Good Remote;   Good  Judgement:  Good  Insight:  Good  Psychomotor Activity:  Normal  Concentration:  Concentration: Good and Attention Span: Good  Recall:  Good  Fund of Knowledge: Good  Language: Good  Akathisia:  No  Handed:  Right  AIMS (if indicated): not done  Assets:  Communication Skills Desire for Improvement Financial Resources/Insurance Housing Physical Health  ADL's:  Intact  Cognition: WNL  Sleep:  Fair   Screenings: AIMS    Flowsheet Row Video Visit from 04/20/2020 in [facility]  AIMS Total Score 0   AUDIT    Flowsheet Row [facility]  Alcohol Use Disorder Identification Test Final Score (AUDIT) 0   GAD-7    Flowsheet Row Video Visit from 03/13/2024 in [facility]  Video Visit from 12/29/2023 in [facility] Video Visit from 07/05/2023 in [facility] Video Visit  from 04/26/2023 in [facility] Video Visit from 02/28/2023 in [facility]  Total GAD-7 Score 7 13 8 8 11    PHQ2-9    Flowsheet Row Video Visit from 03/13/2024 in [facility] Video Visit from 12/29/2023 in [facility] Video Visit from 07/05/2023 in [facility] Video Visit from 04/26/2023 in [facility] Video Visit from 02/28/2023 in [facility]  PHQ-2 Total Score 3 4 2 2 3   PHQ-9 Total Score 11 15 8 8 10    Flowsheet Row Video Visit from 03/13/2024 in [facility] Video Visit from 12/29/2023 in [facility] Video Visit from 04/26/2023 in [facility]  C-SSRS RISK CATEGORY Error: Q7 should not be populated when Q6 is No Error: Q7 should not be populated when Q6 is No Error: Q7 should not be populated when Q6 is No     Assessment and Plan: Patient reports that the increase in her medications has helped manage her anxiety and depression.  She does note that her sleep continues to be problematic due to her mother.   No medication changes made today.  Patient agreeable to continue medication as prescribed.   1. GAD (generalized anxiety disorder)  Continue- gabapentin  (NEURONTIN ) 300 MG capsule; TAKE 1 CAPSULE BY MOUTH THREE TIMES A DAY  Dispense: 90 capsule; Refill: 3 Continue- DULoxetine  HCl 40 MG CPEP; Take 1 capsule (40 mg total) by mouth daily.  Dispense: 30 capsule; Refill: 3 Continue- sertraline  (ZOLOFT ) 100 MG tablet; Take 2 tablets (200 mg total) by mouth daily.  Dispense: 60 tablet; Refill: 3 Continue- busPIRone  (BUSPAR ) 15 MG tablet; Take 1 tablet (15 mg total) by mouth 2 (two) times daily.  Dispense: 30 tablet; Refill: 3 - Ambulatory referral to Social Work  2. Mild depression  Continue- ARIPiprazole  (ABILIFY )  10 MG tablet; Take 1 tablet (10 mg total) by mouth daily.  Dispense: 30 tablet; Refill: 3 Continue- DULoxetine  HCl 40 MG CPEP; Take 1 capsule (40 mg total) by mouth daily.  Dispense: 30 capsule; Refill: 3 Continue- sertraline  (ZOLOFT ) 100 MG tablet; Take 2 tablets (200 mg total) by mouth daily.  Dispense: 60 tablet; Refill: 3 Continue- busPIRone  (BUSPAR ) 15 MG tablet; Take 1 tablet (15 mg total) by mouth 2 (two) times daily.  Dispense: 30 tablet; Refill: 3 - Ambulatory referral to Social Work  3. Tobacco abuse  Continue- nicotine  (NICODERM CQ  - DOSED IN MG/24 HOURS) 14 mg/24hr patch; Place 1 patch (14 mg total) onto the skin daily.  Dispense: 28 patch; Refill: 3  Follow-up in 2.5 months Follow-up with therapy Zane FORBES Bach, NP 03/13/2024, 11:25 AM

## 2024-04-25 ENCOUNTER — Ambulatory Visit (HOSPITAL_COMMUNITY)

## 2024-05-15 ENCOUNTER — Telehealth (HOSPITAL_COMMUNITY): Admitting: Psychiatry
